# Patient Record
Sex: Male | Born: 1970 | Hispanic: No | Marital: Married | Smoking: Current every day smoker
Health system: Southern US, Community
[De-identification: ages and names within clinical notes are randomized; demographics above are authoritative.]

## PROBLEM LIST (undated history)

## (undated) DIAGNOSIS — E7800 Pure hypercholesterolemia, unspecified: Secondary | ICD-10-CM

## (undated) DIAGNOSIS — I10 Essential (primary) hypertension: Secondary | ICD-10-CM

## (undated) DIAGNOSIS — I2510 Atherosclerotic heart disease of native coronary artery without angina pectoris: Secondary | ICD-10-CM

## (undated) DIAGNOSIS — A809 Acute poliomyelitis, unspecified: Secondary | ICD-10-CM

## (undated) DIAGNOSIS — I219 Acute myocardial infarction, unspecified: Secondary | ICD-10-CM

---
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
## 2008-02-12 ENCOUNTER — Emergency Department (HOSPITAL_COMMUNITY): Admission: EM | Admit: 2008-02-12 | Discharge: 2008-02-13 | Payer: Self-pay | Admitting: Emergency Medicine

## 2008-03-07 ENCOUNTER — Emergency Department (HOSPITAL_COMMUNITY): Admission: EM | Admit: 2008-03-07 | Discharge: 2008-03-07 | Payer: Self-pay | Admitting: Emergency Medicine

## 2008-04-27 ENCOUNTER — Emergency Department (HOSPITAL_COMMUNITY): Admission: EM | Admit: 2008-04-27 | Discharge: 2008-04-27 | Payer: Self-pay | Admitting: Emergency Medicine

## 2008-05-02 ENCOUNTER — Emergency Department (HOSPITAL_COMMUNITY): Admission: EM | Admit: 2008-05-02 | Discharge: 2008-05-03 | Payer: Self-pay | Admitting: Emergency Medicine

## 2008-07-09 ENCOUNTER — Emergency Department (HOSPITAL_COMMUNITY): Admission: EM | Admit: 2008-07-09 | Discharge: 2008-07-09 | Payer: Self-pay | Admitting: Emergency Medicine

## 2009-07-24 ENCOUNTER — Emergency Department (HOSPITAL_COMMUNITY): Admission: EM | Admit: 2009-07-24 | Discharge: 2009-07-25 | Payer: Self-pay | Admitting: Emergency Medicine

## 2010-07-16 ENCOUNTER — Ambulatory Visit: Payer: Medicaid Other | Attending: *Deleted | Admitting: Physical Therapy

## 2010-08-20 LAB — BASIC METABOLIC PANEL
CO2: 26 mEq/L (ref 19–32)
Calcium: 9 mg/dL (ref 8.4–10.5)
Chloride: 105 mEq/L (ref 96–112)
Creatinine, Ser: 0.56 mg/dL (ref 0.4–1.5)
Potassium: 3.6 mEq/L (ref 3.5–5.1)

## 2010-08-20 LAB — POCT CARDIAC MARKERS
CKMB, poc: 2.2 ng/mL (ref 1.0–8.0)
Troponin i, poc: <0.05 ng/mL (ref 0.00–0.09)

## 2010-08-20 LAB — DIFFERENTIAL
Basophils Relative: 1 % (ref 0–1)
Eosinophils Absolute: 0.1 10*3/uL (ref 0.0–0.7)
Lymphs Abs: 3.7 10*3/uL (ref 0.7–4.0)
Neutro Abs: 9.2 10*3/uL — ABNORMAL HIGH (ref 1.7–7.7)
Neutrophils Relative %: 67 % (ref 43–77)

## 2010-08-20 LAB — CBC
HCT: 43.7 % (ref 39.0–52.0)
Hemoglobin: 15.3 g/dL (ref 13.0–17.0)
Platelets: 227 10*3/uL (ref 150–400)
RDW: 12.4 % (ref 11.5–15.5)

## 2011-03-04 LAB — POCT I-STAT, CHEM 8
Creatinine, Ser: 0.8
HCT: 55 — ABNORMAL HIGH
Sodium: 139

## 2011-03-04 LAB — POCT CARDIAC MARKERS
CKMB, poc: 2
Troponin i, poc: <0.05

## 2011-03-06 LAB — POCT CARDIAC MARKERS
CKMB, poc: 1.6 ng/mL (ref 1.0–8.0)
CKMB, poc: 1.8 ng/mL (ref 1.0–8.0)
Myoglobin, poc: 89.7 ng/mL (ref 12–200)

## 2011-03-06 LAB — CBC
HCT: 52.2 % — ABNORMAL HIGH (ref 39.0–52.0)
Hemoglobin: 17.7 g/dL — ABNORMAL HIGH (ref 13.0–17.0)
RBC: 5.67 MIL/uL (ref 4.22–5.81)
WBC: 11.3 10*3/uL — ABNORMAL HIGH (ref 4.0–10.5)

## 2011-03-06 LAB — DIFFERENTIAL
Basophils Absolute: 0.1 10*3/uL (ref 0.0–0.1)
Basophils Relative: 1 % (ref 0–1)
Eosinophils Absolute: 0.1 10*3/uL (ref 0.0–0.7)
Eosinophils Relative: 1 % (ref 0–5)
Lymphocytes Relative: 22 % (ref 12–46)
Lymphs Abs: 2.5 10*3/uL (ref 0.7–4.0)
Monocytes Absolute: 0.9 10*3/uL (ref 0.1–1.0)

## 2011-03-06 LAB — BASIC METABOLIC PANEL
GFR calc Af Amer: >60 mL/min (ref >60)
GFR calc non Af Amer: >60 mL/min (ref >60)
Sodium: 138 mEq/L (ref 135–145)

## 2011-04-06 ENCOUNTER — Emergency Department (HOSPITAL_COMMUNITY)
Admission: EM | Admit: 2011-04-06 | Discharge: 2011-04-06 | Discharge status: Against Medical Advice | Payer: Medicaid Other | Attending: Emergency Medicine | Admitting: Emergency Medicine

## 2011-04-06 ENCOUNTER — Encounter (HOSPITAL_COMMUNITY): Payer: Self-pay | Admitting: Emergency Medicine

## 2011-04-06 ENCOUNTER — Other Ambulatory Visit: Payer: Self-pay

## 2011-04-06 DIAGNOSIS — R079 Chest pain, unspecified: Secondary | ICD-10-CM | POA: Insufficient documentation

## 2011-04-06 DIAGNOSIS — A809 Acute poliomyelitis, unspecified: Secondary | ICD-10-CM | POA: Insufficient documentation

## 2011-04-06 HISTORY — DX: Essential (primary) hypertension: I10

## 2011-04-06 HISTORY — DX: Acute poliomyelitis, unspecified: A80.9
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
## 2011-04-06 NOTE — ED Notes (Signed)
Pt called x3 times, unable to locate.

## 2011-04-06 NOTE — ED Notes (Signed)
Pt acuity is 3

## 2012-06-25 ENCOUNTER — Emergency Department (INDEPENDENT_AMBULATORY_CARE_PROVIDER_SITE_OTHER)
Admission: EM | Admit: 2012-06-25 | Discharge: 2012-06-25 | Disposition: A | Discharge status: Home / Self Care | Payer: Medicaid Other | Source: Home / Self Care | Attending: Family Medicine | Admitting: Family Medicine

## 2012-06-25 ENCOUNTER — Encounter (HOSPITAL_COMMUNITY): Payer: Self-pay | Admitting: Emergency Medicine

## 2012-06-25 DIAGNOSIS — M545 Low back pain: Secondary | ICD-10-CM

## 2012-06-25 DIAGNOSIS — M542 Cervicalgia: Secondary | ICD-10-CM

## 2012-06-25 DIAGNOSIS — Z041 Encounter for examination and observation following transport accident: Secondary | ICD-10-CM

## 2012-06-25 MED ORDER — DICLOFENAC POTASSIUM 50 MG PO TABS: 50.0000 mg | ORAL_TABLET | Freq: Three times a day (TID) | ORAL | Status: DC

## 2012-06-25 MED ORDER — CYCLOBENZAPRINE HCL 5 MG PO TABS: 5.0000 mg | ORAL_TABLET | Freq: Three times a day (TID) | ORAL | Status: DC | PRN
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
## 2012-07-10 ENCOUNTER — Encounter (HOSPITAL_COMMUNITY): Payer: Self-pay | Admitting: Emergency Medicine

## 2012-07-10 ENCOUNTER — Emergency Department (HOSPITAL_COMMUNITY)
Admission: EM | Admit: 2012-07-10 | Discharge: 2012-07-11 | Disposition: A | Discharge status: Home / Self Care | Payer: No Typology Code available for payment source | Attending: Emergency Medicine | Admitting: Emergency Medicine

## 2012-07-10 DIAGNOSIS — G8911 Acute pain due to trauma: Secondary | ICD-10-CM | POA: Insufficient documentation

## 2012-07-10 DIAGNOSIS — I10 Essential (primary) hypertension: Secondary | ICD-10-CM | POA: Insufficient documentation

## 2012-07-10 DIAGNOSIS — G44209 Tension-type headache, unspecified, not intractable: Secondary | ICD-10-CM

## 2012-07-10 DIAGNOSIS — S161XXA Strain of muscle, fascia and tendon at neck level, initial encounter: Secondary | ICD-10-CM

## 2012-07-10 DIAGNOSIS — M542 Cervicalgia: Secondary | ICD-10-CM | POA: Insufficient documentation

## 2012-07-10 DIAGNOSIS — Z8619 Personal history of other infectious and parasitic diseases: Secondary | ICD-10-CM | POA: Insufficient documentation

## 2012-07-10 DIAGNOSIS — Z79899 Other long term (current) drug therapy: Secondary | ICD-10-CM | POA: Insufficient documentation

## 2012-07-10 DIAGNOSIS — F17200 Nicotine dependence, unspecified, uncomplicated: Secondary | ICD-10-CM | POA: Insufficient documentation
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
## 2012-07-11 ENCOUNTER — Emergency Department (HOSPITAL_COMMUNITY): Payer: No Typology Code available for payment source

## 2012-07-11 MED ORDER — IBUPROFEN 800 MG PO TABS: 800.0000 mg | ORAL_TABLET | Freq: Three times a day (TID) | ORAL | Status: DC | PRN

## 2012-07-11 MED ORDER — IBUPROFEN 800 MG PO TABS
800.0000 mg | ORAL_TABLET | Freq: Once | ORAL | Status: AC
  Administered 2012-07-11: 800 mg via ORAL
  Filled 2012-07-11: qty 1

## 2012-07-11 NOTE — ED Notes (Signed)
Pt taken to radiology

## 2012-07-11 NOTE — ED Notes (Signed)
Pt. Returned from Radiology.
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
## 2012-09-07 ENCOUNTER — Emergency Department (INDEPENDENT_AMBULATORY_CARE_PROVIDER_SITE_OTHER)
Admission: EM | Admit: 2012-09-07 | Discharge: 2012-09-07 | Disposition: A | Discharge status: Home / Self Care | Payer: Medicaid Other | Source: Home / Self Care | Attending: Family Medicine | Admitting: Family Medicine

## 2012-09-07 ENCOUNTER — Encounter (HOSPITAL_COMMUNITY): Payer: Self-pay | Admitting: *Deleted

## 2012-09-07 DIAGNOSIS — M542 Cervicalgia: Secondary | ICD-10-CM
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
## 2013-02-10 ENCOUNTER — Emergency Department (HOSPITAL_COMMUNITY)
Admission: EM | Admit: 2013-02-10 | Discharge: 2013-02-11 | Disposition: A | Discharge status: Home / Self Care | Payer: Medicaid Other | Attending: Emergency Medicine | Admitting: Emergency Medicine

## 2013-02-10 ENCOUNTER — Encounter (HOSPITAL_COMMUNITY): Payer: Self-pay

## 2013-02-10 ENCOUNTER — Emergency Department (HOSPITAL_COMMUNITY): Payer: Medicaid Other

## 2013-02-10 DIAGNOSIS — F17200 Nicotine dependence, unspecified, uncomplicated: Secondary | ICD-10-CM | POA: Insufficient documentation

## 2013-02-10 DIAGNOSIS — J40 Bronchitis, not specified as acute or chronic: Secondary | ICD-10-CM | POA: Insufficient documentation

## 2013-02-10 DIAGNOSIS — R0789 Other chest pain: Secondary | ICD-10-CM | POA: Insufficient documentation

## 2013-02-10 DIAGNOSIS — Z79899 Other long term (current) drug therapy: Secondary | ICD-10-CM | POA: Insufficient documentation

## 2013-02-10 DIAGNOSIS — R51 Headache: Secondary | ICD-10-CM | POA: Insufficient documentation

## 2013-02-10 DIAGNOSIS — I10 Essential (primary) hypertension: Secondary | ICD-10-CM | POA: Insufficient documentation

## 2013-02-10 DIAGNOSIS — G44209 Tension-type headache, unspecified, not intractable: Secondary | ICD-10-CM

## 2013-02-10 DIAGNOSIS — M542 Cervicalgia: Secondary | ICD-10-CM | POA: Insufficient documentation

## 2013-02-10 DIAGNOSIS — Z8612 Personal history of poliomyelitis: Secondary | ICD-10-CM | POA: Insufficient documentation

## 2013-02-10 DIAGNOSIS — R112 Nausea with vomiting, unspecified: Secondary | ICD-10-CM | POA: Insufficient documentation

## 2013-02-10 MED ORDER — LORAZEPAM 1 MG PO TABS
1.0000 mg | ORAL_TABLET | Freq: Once | ORAL | Status: AC
  Administered 2013-02-11: 1 mg via ORAL
  Filled 2013-02-10: qty 1

## 2013-02-10 MED ORDER — KETOROLAC TROMETHAMINE 60 MG/2ML IM SOLN
60.0000 mg | Freq: Once | INTRAMUSCULAR | Status: AC
Start: 2013-02-11 — End: 2013-02-11
  Administered 2013-02-11: 60 mg via INTRAMUSCULAR
  Filled 2013-02-10: qty 2

## 2013-02-10 MED ORDER — METOCLOPRAMIDE HCL 10 MG PO TABS
10.0000 mg | ORAL_TABLET | Freq: Once | ORAL | Status: AC
  Administered 2013-02-11: 10 mg via ORAL
  Filled 2013-02-10: qty 1

## 2013-02-10 NOTE — ED Notes (Signed)
Pt states headache at triage.  Pt states now chest pain and nausea/vomiting since last night.  Pt also states increased in shortness of breath.

## 2013-02-11 LAB — CBC WITH DIFFERENTIAL/PLATELET
Basophils Relative: 0 % (ref 0–1)
Eosinophils Absolute: 0.1 10*3/uL (ref 0.0–0.7)
Eosinophils Relative: 0 % (ref 0–5)
HCT: 39.6 % (ref 39.0–52.0)
Hemoglobin: 13.7 g/dL (ref 13.0–17.0)
MCH: 30.2 pg (ref 26.0–34.0)
MCHC: 34.6 g/dL (ref 30.0–36.0)
MCV: 87.4 fL (ref 78.0–100.0)
Monocytes Absolute: 0.5 10*3/uL (ref 0.1–1.0)
Monocytes Relative: 4 % (ref 3–12)
Neutrophils Relative %: 77 % (ref 43–77)

## 2013-02-11 LAB — COMPREHENSIVE METABOLIC PANEL
Albumin: 3.3 g/dL — ABNORMAL LOW (ref 3.5–5.2)
BUN: 6 mg/dL (ref 6–23)
Calcium: 9.6 mg/dL (ref 8.4–10.5)
Creatinine, Ser: 0.52 mg/dL (ref 0.50–1.35)
GFR calc Af Amer: >90 mL/min (ref >90)
Glucose, Bld: 161 mg/dL — ABNORMAL HIGH (ref 70–99)
Total Protein: 6.8 g/dL (ref 6.0–8.3)

## 2013-02-11 LAB — TROPONIN I: Troponin I: <0.3 ng/mL (ref <0.30)

## 2013-02-11 MED ORDER — LORAZEPAM 1 MG PO TABS: 1.0000 mg | ORAL_TABLET | Freq: Four times a day (QID) | ORAL | Status: DC | PRN

## 2013-02-11 MED ORDER — IBUPROFEN 600 MG PO TABS: 600.0000 mg | ORAL_TABLET | Freq: Four times a day (QID) | ORAL | Status: DC | PRN

## 2013-02-11 MED ORDER — BENZONATATE 100 MG PO CAPS: 100.0000 mg | ORAL_CAPSULE | Freq: Three times a day (TID) | ORAL | Status: DC
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
## 2013-02-11 NOTE — ED Notes (Signed)
EKG given to EDP, Yelver[place], MD. 

## 2013-03-21 ENCOUNTER — Emergency Department (HOSPITAL_COMMUNITY): Payer: Medicaid Other

## 2013-03-21 ENCOUNTER — Encounter (HOSPITAL_COMMUNITY): Payer: Self-pay | Admitting: Emergency Medicine

## 2013-03-21 ENCOUNTER — Emergency Department (HOSPITAL_COMMUNITY)
Admission: EM | Admit: 2013-03-21 | Discharge: 2013-03-22 | Discharge status: Against Medical Advice | Payer: Medicaid Other | Attending: Emergency Medicine | Admitting: Emergency Medicine

## 2013-03-21 DIAGNOSIS — Z8612 Personal history of poliomyelitis: Secondary | ICD-10-CM | POA: Insufficient documentation

## 2013-03-21 DIAGNOSIS — R079 Chest pain, unspecified: Secondary | ICD-10-CM

## 2013-03-21 DIAGNOSIS — G8929 Other chronic pain: Secondary | ICD-10-CM | POA: Insufficient documentation

## 2013-03-21 DIAGNOSIS — Z79899 Other long term (current) drug therapy: Secondary | ICD-10-CM | POA: Insufficient documentation

## 2013-03-21 DIAGNOSIS — I10 Essential (primary) hypertension: Secondary | ICD-10-CM | POA: Insufficient documentation

## 2013-03-21 DIAGNOSIS — R0789 Other chest pain: Secondary | ICD-10-CM | POA: Insufficient documentation

## 2013-03-21 DIAGNOSIS — F17200 Nicotine dependence, unspecified, uncomplicated: Secondary | ICD-10-CM | POA: Insufficient documentation

## 2013-03-21 DIAGNOSIS — M549 Dorsalgia, unspecified: Secondary | ICD-10-CM | POA: Insufficient documentation

## 2013-03-21 DIAGNOSIS — R209 Unspecified disturbances of skin sensation: Secondary | ICD-10-CM | POA: Insufficient documentation

## 2013-03-21 LAB — CBC WITH DIFFERENTIAL/PLATELET
Basophils Relative: 0 % (ref 0–1)
Hemoglobin: 14.5 g/dL (ref 13.0–17.0)
Lymphs Abs: 4.2 10*3/uL — ABNORMAL HIGH (ref 0.7–4.0)
Monocytes Relative: 7 % (ref 3–12)
Neutro Abs: 7.7 10*3/uL (ref 1.7–7.7)
Neutrophils Relative %: 60 % (ref 43–77)
Platelets: 254 10*3/uL (ref 150–400)
RBC: 4.84 MIL/uL (ref 4.22–5.81)

## 2013-03-21 LAB — COMPREHENSIVE METABOLIC PANEL
ALT: 17 U/L (ref 0–53)
Albumin: 3.5 g/dL (ref 3.5–5.2)
Alkaline Phosphatase: 82 U/L (ref 39–117)
BUN: 9 mg/dL (ref 6–23)
Chloride: 101 mEq/L (ref 96–112)
Potassium: 3.3 mEq/L — ABNORMAL LOW (ref 3.5–5.1)
Sodium: 136 mEq/L (ref 135–145)
Total Bilirubin: 0.2 mg/dL — ABNORMAL LOW (ref 0.3–1.2)
Total Protein: 7 g/dL (ref 6.0–8.3)

## 2013-03-21 LAB — TROPONIN I: Troponin I: <0.3 ng/mL

## 2013-03-21 MED ORDER — ASPIRIN 81 MG PO CHEW
324.0000 mg | CHEWABLE_TABLET | Freq: Once | ORAL | Status: AC
  Administered 2013-03-22: 324 mg via ORAL
  Filled 2013-03-21: qty 4

## 2013-03-21 NOTE — ED Notes (Signed)
Patient transported to X-ray 
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
## 2013-03-21 NOTE — ED Notes (Signed)
Pt c/o l chest pain x a few hours. Pt reports pain radiating to left arm and back.  Denies SOB. Hx of HTN and tingling in hands,.
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
## 2013-05-30 ENCOUNTER — Emergency Department (HOSPITAL_COMMUNITY)
Admission: EM | Admit: 2013-05-30 | Discharge: 2013-05-31 | Disposition: A | Discharge status: Home / Self Care | Payer: Medicaid Other | Attending: Emergency Medicine | Admitting: Emergency Medicine

## 2013-05-30 ENCOUNTER — Encounter (HOSPITAL_COMMUNITY): Payer: Self-pay | Admitting: Emergency Medicine

## 2013-05-30 DIAGNOSIS — F17200 Nicotine dependence, unspecified, uncomplicated: Secondary | ICD-10-CM | POA: Insufficient documentation

## 2013-05-30 DIAGNOSIS — Z79899 Other long term (current) drug therapy: Secondary | ICD-10-CM | POA: Insufficient documentation

## 2013-05-30 DIAGNOSIS — I10 Essential (primary) hypertension: Secondary | ICD-10-CM | POA: Insufficient documentation

## 2013-05-30 DIAGNOSIS — B86 Scabies: Secondary | ICD-10-CM | POA: Insufficient documentation

## 2013-05-30 NOTE — ED Notes (Signed)
Pt complains of a rash on his chest and legs for three weeks

## 2013-05-31 MED ORDER — PERMETHRIN 5 % EX CREA: TOPICAL_CREAM | CUTANEOUS | Status: DC

## 2013-05-31 NOTE — ED Provider Notes (Signed)
CSN: [id number]     Arrival date & time 05/30/13  2319 History   First MD Initiated Contact with Patient 05/31/13 0048     Chief Complaint  Patient presents with  . Rash   HPI  History provided by the patient. Patient is a 42 year old male with history of hypertension and polio who presents with persistent pruritic rash to body. Patient states that the entire family including his wife and 2 children have had similar pruritic rash to the skin for the past 3 weeks. He complains mostly of itching and rash to his hands and arms as well as his lower abdomen and groin. He reports symptoms have been worsening without any improvement. Has not been using any treatment for symptoms. Denies having similar symptoms previously. No changes in home living. No new linens, beds or clothing. No new soaps or body lotions. No other aggravating or alleviating factors. No other associated symptoms.   Past Medical History  Diagnosis Date  . Polio   . Hypertension    Past Surgical History  Procedure Laterality Date  . Hip surgery Left 1987    for polio, not sure what was done  . Knee surgery Left 1987    for polio   Family History  Problem Relation Age of Onset  . Hypertension Mother   . Heart failure Father    History  Substance Use Topics  . Smoking status: Current Every Day Smoker -- 1.50 packs/day    Types: Cigarettes  . Smokeless tobacco: Not on file  . Alcohol Use: No    Review of Systems  Constitutional: Negative for fever, chills and diaphoresis.  All other systems reviewed and are negative.    Allergies  Review of patient's allergies indicates no known allergies.  Home Medications   Current Outpatient Rx  Name  Route  Sig  Dispense  Refill  . hydrochlorothiazide (HYDRODIURIL) 25 MG tablet   Oral   Take 25 mg by mouth every evening.          [name] lisinopril (PRINIVIL,ZESTRIL) 40 MG tablet   Oral   Take 40 mg by mouth daily.           . tadalafil (CIALIS) 10 MG tablet  Oral   Take 10 mg by mouth daily as needed for erectile dysfunction.          BP 125/79  Pulse 102  Temp(Src) 98.8 F (37.1 C) (Oral)  Resp 16  SpO2 97% Physical Exam  Nursing note and vitals reviewed. Constitutional: He appears well-developed and well-nourished. No distress.  HENT:  Head: Normocephalic.  Mouth/Throat: Oropharynx is clear and moist.  Cardiovascular: Normal rate and regular rhythm.   Pulmonary/Chest: Effort normal and breath sounds normal. No respiratory distress. He has no wheezes. He has no rales.  Abdominal: Soft.  Neurological: He is alert.  Skin: Skin is warm. Rash noted.  Multiple small maculopapular lesions with secondary excoriations bilateral upper extremities, upper chest and lower abdomen.  Psychiatric: He has a normal mood and affect.    ED Course  Procedures    COORDINATION OF CARE:  Nursing notes reviewed. Vital signs reviewed. Initial pt interview and examination performed.   1:00 AM-rash consistent with other family members. Suspicious and concerning for scabies. Diagnosis and treatment discussed with family.    MDM   1. Scabies        Angus Seller, PA-C 05/31/13 [phone]

## 2013-06-01 NOTE — ED Provider Notes (Signed)
Medical screening examination/treatment/procedure(s) were performed by non-physician practitioner and as supervising physician I was immediately available for consultation/collaboration.    Tammatha Cobb, MD 06/01/13 0951 

## 2013-11-15 ENCOUNTER — Emergency Department (HOSPITAL_COMMUNITY): Payer: Medicaid Other

## 2013-11-15 ENCOUNTER — Emergency Department (HOSPITAL_COMMUNITY)
Admission: EM | Admit: 2013-11-15 | Discharge: 2013-11-15 | Disposition: A | Discharge status: Home / Self Care | Payer: Medicaid Other | Attending: Emergency Medicine | Admitting: Emergency Medicine

## 2013-11-15 ENCOUNTER — Encounter (HOSPITAL_COMMUNITY): Payer: Self-pay | Admitting: Emergency Medicine

## 2013-11-15 DIAGNOSIS — M25531 Pain in right wrist: Secondary | ICD-10-CM

## 2013-11-15 DIAGNOSIS — M25539 Pain in unspecified wrist: Secondary | ICD-10-CM | POA: Insufficient documentation

## 2013-11-15 DIAGNOSIS — R5383 Other fatigue: Secondary | ICD-10-CM

## 2013-11-15 DIAGNOSIS — I10 Essential (primary) hypertension: Secondary | ICD-10-CM | POA: Insufficient documentation

## 2013-11-15 DIAGNOSIS — Z8619 Personal history of other infectious and parasitic diseases: Secondary | ICD-10-CM | POA: Insufficient documentation

## 2013-11-15 DIAGNOSIS — M542 Cervicalgia: Secondary | ICD-10-CM | POA: Insufficient documentation

## 2013-11-15 DIAGNOSIS — M25532 Pain in left wrist: Secondary | ICD-10-CM

## 2013-11-15 DIAGNOSIS — F17200 Nicotine dependence, unspecified, uncomplicated: Secondary | ICD-10-CM | POA: Insufficient documentation

## 2013-11-15 DIAGNOSIS — R5381 Other malaise: Secondary | ICD-10-CM | POA: Insufficient documentation

## 2013-11-15 DIAGNOSIS — R0789 Other chest pain: Secondary | ICD-10-CM | POA: Insufficient documentation

## 2013-11-15 DIAGNOSIS — Z79899 Other long term (current) drug therapy: Secondary | ICD-10-CM | POA: Insufficient documentation

## 2013-11-15 DIAGNOSIS — IMO0001 Reserved for inherently not codable concepts without codable children: Secondary | ICD-10-CM | POA: Insufficient documentation

## 2013-11-15 DIAGNOSIS — M546 Pain in thoracic spine: Secondary | ICD-10-CM | POA: Insufficient documentation

## 2013-11-15 LAB — CBC
HCT: 45 % (ref 39.0–52.0)
HEMOGLOBIN: 15.4 g/dL (ref 13.0–17.0)
MCH: 30 pg (ref 26.0–34.0)
MCHC: 34.2 g/dL (ref 30.0–36.0)
MCV: 87.7 fL (ref 78.0–100.0)
Platelets: 238 10*3/uL (ref 150–400)
RBC: 5.13 MIL/uL (ref 4.22–5.81)
RDW: 13 % (ref 11.5–15.5)
WBC: 16.8 10*3/uL — ABNORMAL HIGH (ref 4.0–10.5)

## 2013-11-15 LAB — BASIC METABOLIC PANEL
BUN: 7 mg/dL (ref 6–23)
CO2: 23 meq/L (ref 19–32)
Calcium: 9.7 mg/dL (ref 8.4–10.5)
Chloride: 99 mEq/L (ref 96–112)
Creatinine, Ser: 0.59 mg/dL (ref 0.50–1.35)
GFR calc Af Amer: >90 mL/min (ref >90)
GFR calc non Af Amer: >90 mL/min (ref >90)
GLUCOSE: 99 mg/dL (ref 70–99)
POTASSIUM: 3.5 meq/L — AB (ref 3.7–5.3)
SODIUM: 137 meq/L (ref 137–147)

## 2013-11-15 LAB — PRO B NATRIURETIC PEPTIDE: PRO B NATRI PEPTIDE: 7.4 pg/mL (ref 0–125)

## 2013-11-15 LAB — I-STAT TROPONIN, ED: Troponin i, poc: 0 ng/mL (ref 0.00–0.08)

## 2013-11-15 LAB — D-DIMER, QUANTITATIVE (NOT AT ARMC): D DIMER QUANT: 0.27 ug{FEU}/mL (ref 0.00–0.48)

## 2013-11-15 MED ORDER — KETOROLAC TROMETHAMINE 60 MG/2ML IM SOLN
60.0000 mg | Freq: Once | INTRAMUSCULAR | Status: AC
  Administered 2013-11-15: 60 mg via INTRAMUSCULAR
  Filled 2013-11-15: qty 2

## 2013-11-15 MED ORDER — IBUPROFEN 600 MG PO TABS: 600.0000 mg | ORAL_TABLET | Freq: Four times a day (QID) | ORAL | Status: DC | PRN

## 2013-11-15 NOTE — ED Provider Notes (Signed)
CSN: [id number]     Arrival date & time 11/15/13  0411 History   First MD Initiated Contact with Patient 11/15/13 [phone]     Chief Complaint  Patient presents with  . Chest Pain    radiating to both arms, and back x1 month     (Consider location/radiation/quality/duration/timing/severity/associated sxs/prior Treatment) HPI Patient has chronic back shoulder and chest pain. His chest pain has been worsening for the past one month. Patient describes the pain as sharp and left-sided. It is worse with certain movements. Patient has a history of polio and walks with the aid of crutches. He complains of ongoing bilateral wrist pain that radiates up the forearm and down into the hands. This pain is worse at nights. She has no associated numbness but states he does have some weakness in his hands. He denies any shortness. He currently has no chest pain. He has no fevers or chills. Past Medical History  Diagnosis Date  . Polio   . Hypertension    Past Surgical History  Procedure Laterality Date  . Hip surgery Left 1987    for polio, not sure what was done  . Knee surgery Left 1987    for polio   Family History  Problem Relation Age of Onset  . Hypertension Mother   . Heart failure Father    History  Substance Use Topics  . Smoking status: Current Every Day Smoker -- 2.00 packs/day    Types: Cigarettes  . Smokeless tobacco: Not on file  . Alcohol Use: Yes     Comment: occ    Review of Systems  Constitutional: Negative for fever and chills.  Respiratory: Negative for cough and shortness of breath.   Cardiovascular: Positive for chest pain. Negative for palpitations and leg swelling.  Gastrointestinal: Negative for nausea, vomiting, abdominal pain and diarrhea.  Musculoskeletal: Positive for back pain, myalgias and neck pain. Negative for neck stiffness.  Skin: Negative for wound.  Neurological: Positive for weakness. Negative for dizziness, syncope, light-headedness, numbness and  headaches.  All other systems reviewed and are negative.     Allergies  Review of patient's allergies indicates no known allergies.  Home Medications   Prior to Admission medications   Medication Sig Start Date End Date Taking? Authorizing Chelli Yerkes  hydrochlorothiazide (HYDRODIURIL) 25 MG tablet Take 25 mg by mouth every evening.    Yes Historical Xee Hollman, MD  lisinopril (PRINIVIL,ZESTRIL) 40 MG tablet Take 40 mg by mouth daily.     Yes Historical Ezelle Surprenant, MD  tadalafil (CIALIS) 10 MG tablet Take 10 mg by mouth daily as needed for erectile dysfunction.   Yes Historical Ruthia Person, MD   BP 133/89  Pulse 89  Temp(Src) 98.2 F (36.8 C) (Oral)  Resp 16  Ht 5\' 5"  (1.651 m)  Wt 161 lb (73.029 kg)  BMI 26.79 kg/m2  SpO2 100% Physical Exam  Nursing note and vitals reviewed. Constitutional: He is oriented to person, place, and time. He appears well-developed and well-nourished. No distress.  HENT:  Head: Normocephalic and atraumatic.  Mouth/Throat: Oropharynx is clear and moist.  Eyes: EOM are normal. Pupils are equal, round, and reactive to light.  Neck: Normal range of motion. Neck supple.  Cardiovascular: Normal rate and regular rhythm.   Pulmonary/Chest: Effort normal and breath sounds normal. No respiratory distress. He has no wheezes. He has no rales. He exhibits tenderness (Mild left inferior chest tenderness with palpation).  Abdominal: Soft. Bowel sounds are normal. He exhibits no distension and no mass. There  is no tenderness. There is no rebound and no guarding.  Musculoskeletal: Normal range of motion. He exhibits tenderness (diffuse thoracic tenderness with palpation.). He exhibits no edema.  Negative Phalen and Tinel signs. Good distal pulses  Neurological: He is alert and oriented to person, place, and time.  Atrophic lower extremities. Bilateral upper extremities with equal grip strength. Sensation is intact.  Skin: Skin is warm and dry. No rash noted. No erythema.   Psychiatric: He has a normal mood and affect. His behavior is normal.    ED Course  Procedures (including critical care time) Labs Review Labs Reviewed  CBC - Abnormal; Notable for the following:    WBC 16.8 (*)    All other components within normal limits  BASIC METABOLIC PANEL - Abnormal; Notable for the following:    Potassium 3.5 (*)    All other components within normal limits  PRO B NATRIURETIC PEPTIDE  D-DIMER, QUANTITATIVE  I-STAT TROPOININ, ED    Imaging Review No results found.   EKG Interpretation   Date/Time:  Wednesday November 15 2013 04:16:12 EDT Ventricular Rate:  90 PR Interval:  160 QRS Duration: 84 QT Interval:  349 QTC Calculation: 427 R Axis:   77 Text Interpretation:  Sinus rhythm Probable left atrial enlargement  Anteroseptal infarct, old Minimal ST elevation, inferior leads Baseline  wander in lead(s) V3 Confirmed by Ranae PalmsYELVERTON  MD, DAVID ([id number]) on  11/15/2013 6:58:59 AM      MDM   Final diagnoses:  None    Chest pain is atypical. EKG without acute findings. Suspect is symptoms are likely musculoskeletal in nature. Also given the use of crutches and hyperextension of the wrist to use the crutches, carpal tunnel syndrome is a possibility. We'll screen with labs and placed in bilateral Velcro wrist spirit the patient will need to followup with hand surgery.  Normal EKG and troponin. Normal d-dimer. Mild elevation of white blood cell count of uncertain significance. We'll treat with NSAIDs. Hand surgery followup given. Return precautions given.  Loren Raceravid Yelverton, MD 11/15/13 [phone]

## 2013-11-15 NOTE — ED Notes (Signed)
Patient c/o chest pain ongoing x1 month. Patient states pain radiates into both arms and his back and left shoulder. Patient states pain is more intense when he first awakens, and that today the pain is slightly worse than normal.

## 2013-11-15 NOTE — Discharge Instructions (Signed)
Carpal Tunnel Syndrome The carpal tunnel is a narrow area located on the palm side of your wrist. The tunnel is formed by the wrist bones and ligaments. Nerves, blood vessels, and tendons pass through the carpal tunnel. Repeated wrist motion or certain diseases may cause swelling within the tunnel. This swelling pinches the main nerve in the wrist (median nerve) and causes the painful hand and arm condition called carpal tunnel syndrome. CAUSES   Repeated wrist motions.  Wrist injuries.  Certain diseases like arthritis, diabetes, alcoholism, hyperthyroidism, and kidney failure.  Obesity.  Pregnancy. SYMPTOMS   A "pins and needles" feeling in your fingers or hand.  Tingling or numbness in your fingers or hand.  An aching feeling in your entire arm.  Wrist pain that goes up your arm to your shoulder.  Pain that goes down into your palm or fingers.  A weak feeling in your hands. DIAGNOSIS  Your caregiver will take your history and perform a physical exam. An electromyography test may be needed. This test measures electrical signals sent out by the muscles. The electrical signals are usually slowed by carpal tunnel syndrome. You may also need X-rays. TREATMENT  Carpal tunnel syndrome may clear up by itself. Your caregiver may recommend a wrist splint or medicine such as a nonsteroidal anti-inflammatory medicine. Cortisone injections may help. Sometimes, surgery may be needed to free the pinched nerve.  HOME CARE INSTRUCTIONS   Take all medicine as directed by your caregiver. Only take over-the-counter or prescription medicines for pain, discomfort, or fever as directed by your caregiver.  If you were given a splint to keep your wrist from bending, wear it as directed. It is important to wear the splint at night. Wear the splint for as long as you have pain or numbness in your hand, arm, or wrist. This may take 1 to 2 months.  Rest your wrist from any activity that may be causing your  pain. If your symptoms are work-related, you may need to talk to your employer about changing to a job that does not require using your wrist.  Put ice on your wrist after long periods of wrist activity.  Put ice in a plastic bag.  Place a towel between your skin and the bag.  Leave the ice on for 15-20 minutes, 03-04 times a day.  Keep all follow-up visits as directed by your caregiver. This includes any orthopedic referrals, physical therapy, and rehabilitation. Any delay in getting necessary care could result in a delay or failure of your condition to heal. SEEK IMMEDIATE MEDICAL CARE IF:   You have new, unexplained symptoms.  Your symptoms get worse and are not helped or controlled with medicines. MAKE SURE YOU:   Understand these instructions.  Will watch your condition.  Will get help right away if you are not doing well or get worse. Document Released: 05/15/2000 Document Revised: 08/10/2011 Document Reviewed: 04/03/2011 [facility] Patient Information 2015 [place], [place]. This information is not intended to replace advice given to you by your health care provider. Make sure you discuss any questions you have with your health care provider.  Chest Pain (Nonspecific) It is often hard to give a specific diagnosis for the cause of chest pain. There is always a chance that your pain could be related to something serious, such as a heart attack or a blood clot in the lungs. You need to follow up with your health care provider for further evaluation. CAUSES   Heartburn.  Pneumonia or bronchitis.  Anxiety or stress.  Inflammation around your heart (pericarditis) or lung (pleuritis or pleurisy).  A blood clot in the lung.  A collapsed lung (pneumothorax). It can develop suddenly on its own (spontaneous pneumothorax) or from trauma to the chest.  Shingles infection (herpes zoster virus). The chest wall is composed of bones, muscles, and cartilage. Any of these can be the source  of the pain.  The bones can be bruised by injury.  The muscles or cartilage can be strained by coughing or overwork.  The cartilage can be affected by inflammation and become sore (costochondritis). DIAGNOSIS  Lab tests or other studies may be needed to find the cause of your pain. Your health care provider may have you take a test called an ambulatory electrocardiogram (ECG). An ECG records your heartbeat patterns over a 24-hour period. You may also have other tests, such as:  Transthoracic echocardiogram (TTE). During echocardiography, sound waves are used to evaluate how blood flows through your heart.  Transesophageal echocardiogram (TEE).  Cardiac monitoring. This allows your health care provider to monitor your heart rate and rhythm in real time.  Holter monitor. This is a portable device that records your heartbeat and can help diagnose heart arrhythmias. It allows your health care provider to track your heart activity for several days, if needed.  Stress tests by exercise or by giving medicine that makes the heart beat faster. TREATMENT   Treatment depends on what may be causing your chest pain. Treatment may include:  Acid blockers for heartburn.  Anti-inflammatory medicine.  Pain medicine for inflammatory conditions.  Antibiotics if an infection is present.  You may be advised to change lifestyle habits. This includes stopping smoking and avoiding alcohol, caffeine, and chocolate.  You may be advised to keep your head raised (elevated) when sleeping. This reduces the chance of acid going backward from your stomach into your esophagus. Most of the time, nonspecific chest pain will improve within 2-3 days with rest and mild pain medicine.  HOME CARE INSTRUCTIONS   If antibiotics were prescribed, take them as directed. Finish them even if you start to feel better.  For the next few days, avoid physical activities that bring on chest pain. Continue physical activities as  directed.  Do not use any tobacco products, including cigarettes, chewing tobacco, or electronic cigarettes.  Avoid drinking alcohol.  Only take medicine as directed by your health care provider.  Follow your health care provider's suggestions for further testing if your chest pain does not go away.  Keep any follow-up appointments you made. If you do not go to an appointment, you could develop lasting (chronic) problems with pain. If there is any problem keeping an appointment, call to reschedule. SEEK MEDICAL CARE IF:   Your chest pain does not go away, even after treatment.  You have a rash with blisters on your chest.  You have a fever. SEEK IMMEDIATE MEDICAL CARE IF:   You have increased chest pain or pain that spreads to your arm, neck, jaw, back, or abdomen.  You have shortness of breath.  You have an increasing cough, or you cough up blood.  You have severe back or abdominal pain.  You feel nauseous or vomit.  You have severe weakness.  You faint.  You have chills. This is an emergency. Do not wait to see if the pain will go away. Get medical help at once. Call your local emergency services (911 in [place] Do not drive yourself to the hospital. MAKE SURE  YOU:   Understand these instructions.  Will watch your condition.  Will get help right away if you are not doing well or get worse. Document Released: 02/25/2005 Document Revised: 05/23/2013 Document Reviewed: 12/22/2007 [facility] Patient Information 2015 [place], [place]. This information is not intended to replace advice given to you by your health care provider. Make sure you discuss any questions you have with your health care provider.

## 2013-11-15 NOTE — ED Notes (Signed)
Patient also endorses SOB

## 2014-05-10 ENCOUNTER — Encounter (HOSPITAL_COMMUNITY): Payer: Self-pay | Admitting: Emergency Medicine

## 2014-05-10 ENCOUNTER — Emergency Department (HOSPITAL_COMMUNITY)
Admission: EM | Admit: 2014-05-10 | Discharge: 2014-05-10 | Disposition: A | Discharge status: Home / Self Care | Payer: Medicaid Other | Attending: Emergency Medicine | Admitting: Emergency Medicine

## 2014-05-10 ENCOUNTER — Emergency Department (HOSPITAL_COMMUNITY): Payer: Medicaid Other

## 2014-05-10 DIAGNOSIS — Z791 Long term (current) use of non-steroidal anti-inflammatories (NSAID): Secondary | ICD-10-CM | POA: Insufficient documentation

## 2014-05-10 DIAGNOSIS — Z8619 Personal history of other infectious and parasitic diseases: Secondary | ICD-10-CM | POA: Insufficient documentation

## 2014-05-10 DIAGNOSIS — J069 Acute upper respiratory infection, unspecified: Secondary | ICD-10-CM | POA: Diagnosis not present

## 2014-05-10 DIAGNOSIS — R0789 Other chest pain: Secondary | ICD-10-CM | POA: Insufficient documentation

## 2014-05-10 DIAGNOSIS — Z720 Tobacco use: Secondary | ICD-10-CM | POA: Insufficient documentation

## 2014-05-10 DIAGNOSIS — I10 Essential (primary) hypertension: Secondary | ICD-10-CM | POA: Insufficient documentation

## 2014-05-10 DIAGNOSIS — E780 Pure hypercholesterolemia: Secondary | ICD-10-CM | POA: Insufficient documentation

## 2014-05-10 DIAGNOSIS — Z79899 Other long term (current) drug therapy: Secondary | ICD-10-CM | POA: Diagnosis not present

## 2014-05-10 DIAGNOSIS — R079 Chest pain, unspecified: Secondary | ICD-10-CM

## 2014-05-10 HISTORY — DX: Pure hypercholesterolemia, unspecified: E78.00

## 2014-05-10 LAB — I-STAT TROPONIN, ED: TROPONIN I, POC: 0 ng/mL (ref 0.00–0.08)

## 2014-05-10 LAB — CBC
HCT: 41.5 % (ref 39.0–52.0)
HEMOGLOBIN: 14.1 g/dL (ref 13.0–17.0)
MCH: 30.1 pg (ref 26.0–34.0)
MCHC: 34 g/dL (ref 30.0–36.0)
MCV: 88.7 fL (ref 78.0–100.0)
PLATELETS: 225 10*3/uL (ref 150–400)
RBC: 4.68 MIL/uL (ref 4.22–5.81)
RDW: 13.1 % (ref 11.5–15.5)
WBC: 12.8 10*3/uL — ABNORMAL HIGH (ref 4.0–10.5)

## 2014-05-10 LAB — BASIC METABOLIC PANEL
ANION GAP: 15 (ref 5–15)
BUN: 9 mg/dL (ref 6–23)
CALCIUM: 9.3 mg/dL (ref 8.4–10.5)
CO2: 21 mEq/L (ref 19–32)
Chloride: 97 mEq/L (ref 96–112)
Creatinine, Ser: 0.63 mg/dL (ref 0.50–1.35)
GFR calc Af Amer: >90 mL/min (ref >90)
GFR calc non Af Amer: >90 mL/min (ref >90)
GLUCOSE: 146 mg/dL — AB (ref 70–99)
POTASSIUM: 4.1 meq/L (ref 3.7–5.3)
SODIUM: 133 meq/L — AB (ref 137–147)

## 2014-05-10 NOTE — ED Provider Notes (Signed)
CSN: [id number]     Arrival date & time 05/10/14  1311 History   First MD Initiated Contact with Patient 05/10/14 1352     Chief Complaint  Patient presents with  . Chest Pain     (Consider location/radiation/quality/duration/timing/severity/associated sxs/prior Treatment) Patient is a 43 y.o. male presenting with chest pain. The history is provided by the patient. No language interpreter was used.  Chest Pain Pain location:  L chest Pain quality: aching   Pain radiates to:  Does not radiate Pain radiates to the back: no   Pain severity:  Mild Duration:  4 weeks Timing:  Constant Progression:  Unchanged Chronicity:  New Context: not lifting and no movement   Relieved by:  Nothing Worsened by:  Nothing tried Ineffective treatments:  None tried Associated symptoms: cough and dizziness   Associated symptoms: no abdominal pain and no shortness of breath   Pt has had chest pain for 4 days.   Pt has a cough and congestion and feels like he has the flu.  Pt has been taking theraflu.   Pt reports his MD recently started him on blood pressure medication and mevacor.   (mevacor causes him to be sleepy.   Pt is confused about diet.  He does not know what is heart healthy.    Past Medical History  Diagnosis Date  . Polio   . Hypertension   . Hypercholesteremia    Past Surgical History  Procedure Laterality Date  . Hip surgery Left 1987    for polio, not sure what was done  . Knee surgery Left 1987    for polio   Family History  Problem Relation Age of Onset  . Hypertension Mother   . Heart failure Father    History  Substance Use Topics  . Smoking status: Current Every Day Smoker -- 2.00 packs/day    Types: Cigarettes  . Smokeless tobacco: Not on file  . Alcohol Use: Yes     Comment: occ    Review of Systems  Respiratory: Positive for cough. Negative for shortness of breath.   Cardiovascular: Positive for chest pain.  Gastrointestinal: Negative for abdominal pain.   Neurological: Positive for dizziness.  All other systems reviewed and are negative.     Allergies  Review of patient's allergies indicates no known allergies.  Home Medications   Prior to Admission medications   Medication Sig Start Date End Date Taking? Authorizing Provider  Chlorphen-Pseudoephed-APAP (THERAFLU FLU/COLD PO) Take 1 packet by mouth every 6 (six) hours as needed (cold symptoms).   Yes Historical Provider, MD  hydrochlorothiazide (HYDRODIURIL) 25 MG tablet Take 25 mg by mouth daily.    Yes Historical Provider, MD  indomethacin (INDOCIN) 25 MG capsule Take 25 mg by mouth 3 (three) times daily with meals.   Yes Historical Provider, MD  lisinopril (PRINIVIL,ZESTRIL) 40 MG tablet Take 40 mg by mouth daily.     Yes Historical Provider, MD  lovastatin (MEVACOR) 20 MG tablet Take 20 mg by mouth at bedtime.   Yes Historical Provider, MD  tadalafil (CIALIS) 10 MG tablet Take 10 mg by mouth daily as needed for erectile dysfunction.   Yes Historical Provider, MD  ibuprofen (ADVIL,MOTRIN) 600 MG tablet Take 1 tablet (600 mg total) by mouth every 6 (six) hours as needed. 11/15/13   Loren Raceravid Yelverton, MD   BP 144/78 mmHg  Pulse 111  Temp(Src) 98 F (36.7 C) (Oral)  Resp 17  SpO2 99% Physical Exam  Constitutional: He is oriented  to person, place, and time. He appears well-developed and well-nourished.  HENT:  Head: Normocephalic and atraumatic.  Right Ear: External ear normal.  Left Ear: External ear normal.  Mouth/Throat: Oropharynx is clear and moist.  Eyes: Conjunctivae and EOM are normal. Pupils are equal, round, and reactive to light.  Neck: Normal range of motion.  Cardiovascular: Normal rate and normal heart sounds.   Pulmonary/Chest: Effort normal and breath sounds normal.  Abdominal: Soft. He exhibits no distension.  Musculoskeletal: Normal range of motion.  Neurological: He is alert and oriented to person, place, and time.  Skin: Skin is warm.  Psychiatric: He has a  normal mood and affect.  Nursing note and vitals reviewed.   ED Course  Procedures (including critical care time) Labs Review Labs Reviewed  CBC - Abnormal; Notable for the following:    WBC 12.8 (*)    All other components within normal limits  BASIC METABOLIC PANEL - Abnormal; Notable for the following:    Sodium 133 (*)    Glucose, Bld 146 (*)    All other components within normal limits  I-STAT TROPOININ, ED    Imaging Review Dg Chest 2 View (if Patient Has Fever And/or Copd)  05/10/2014   CLINICAL DATA:  Chest pain  EXAM: CHEST  2 VIEW  COMPARISON:  11/15/2013  FINDINGS: The heart size and mediastinal contours are within normal limits. Both lungs are clear. The visualized skeletal structures are unremarkable.  IMPRESSION: No active cardiopulmonary disease.   Electronically Signed   By: Marlan Palauharles  Clark M.D.   On: 05/10/2014 14:18     EKG Interpretation   Date/Time:  Thursday May 10 2014 13:19:56 EST Ventricular Rate:  111 PR Interval:  146 QRS Duration: 83 QT Interval:  318 QTC Calculation: 432 R Axis:   69 Text Interpretation:  Sinus tachycardia Anteroseptal infarct, old No acute  or new changes Confirmed by Rhunette CroftNANAVATI, MD, Janey GentaANKIT [phone]([id number]) on 05/10/2014  1:33:26 PM      MDM   Final diagnoses:  URI (upper respiratory infection)  Chest pain, unspecified chest pain type    Pt advised to pt to schedule with nutritionist.   I counseled on heart healthy diet.   I think current symptoms are uri.   I do not think MI or angina.   Pt is high risk and understands need for life changes and medication.  Pt advised to stop smokling   Lonia SkinnerLeslie K Port RoyalSofia, PA-C 05/10/14 1440  Derwood KaplanAnkit Nanavati, MD 05/10/14 [phone]

## 2014-05-10 NOTE — ED Notes (Signed)
Pt reports chest pain for past 4 days with radiation to L shoulder and back. Reports some sob, but speaking in complete sentences. No dizziness or light-headedness. Has also been having head/neck fullness, symptoms improved with thera-flu.

## 2014-05-10 NOTE — Discharge Instructions (Signed)
Chest Pain (Nonspecific) °It is often hard to give a specific diagnosis for the cause of chest pain. There is always a chance that your pain could be related to something serious, such as a heart attack or a blood clot in the lungs. You need to follow up with your health care provider for further evaluation. °CAUSES  °· Heartburn. °· Pneumonia or bronchitis. °· Anxiety or stress. °· Inflammation around your heart (pericarditis) or lung (pleuritis or pleurisy). °· A blood clot in the lung. °· A collapsed lung (pneumothorax). It can develop suddenly on its own (spontaneous pneumothorax) or from trauma to the chest. °· Shingles infection (herpes zoster virus). °The chest wall is composed of bones, muscles, and cartilage. Any of these can be the source of the pain. °· The bones can be bruised by injury. °· The muscles or cartilage can be strained by coughing or overwork. °· The cartilage can be affected by inflammation and become sore (costochondritis). °DIAGNOSIS  °Lab tests or other studies may be needed to find the cause of your pain. Your health care provider may have you take a test called an ambulatory electrocardiogram (ECG). An ECG records your heartbeat patterns over a 24-hour period. You may also have other tests, such as: °· Transthoracic echocardiogram (TTE). During echocardiography, sound waves are used to evaluate how blood flows through your heart. °· Transesophageal echocardiogram (TEE). °· Cardiac monitoring. This allows your health care provider to monitor your heart rate and rhythm in real time. °· Holter monitor. This is a portable device that records your heartbeat and can help diagnose heart arrhythmias. It allows your health care provider to track your heart activity for several days, if needed. °· Stress tests by exercise or by giving medicine that makes the heart beat faster. °TREATMENT  °· Treatment depends on what may be causing your chest pain. Treatment may include: °· Acid blockers for  heartburn. °· Anti-inflammatory medicine. °· Pain medicine for inflammatory conditions. °· Antibiotics if an infection is present. °· You may be advised to change lifestyle habits. This includes stopping smoking and avoiding alcohol, caffeine, and chocolate. °· You may be advised to keep your head raised (elevated) when sleeping. This reduces the chance of acid going backward from your stomach into your esophagus. °Most of the time, nonspecific chest pain will improve within 2-3 days with rest and mild pain medicine.  °HOME CARE INSTRUCTIONS  °· If antibiotics were prescribed, take them as directed. Finish them even if you start to feel better. °· For the next few days, avoid physical activities that bring on chest pain. Continue physical activities as directed. °· Do not use any tobacco products, including cigarettes, chewing tobacco, or electronic cigarettes. °· Avoid drinking alcohol. °· Only take medicine as directed by your health care provider. °· Follow your health care provider's suggestions for further testing if your chest pain does not go away. °· Keep any follow-up appointments you made. If you do not go to an appointment, you could develop lasting (chronic) problems with pain. If there is any problem keeping an appointment, call to reschedule. °SEEK MEDICAL CARE IF:  °· Your chest pain does not go away, even after treatment. °· You have a rash with blisters on your chest. °· You have a fever. °SEEK IMMEDIATE MEDICAL CARE IF:  °· You have increased chest pain or pain that spreads to your arm, neck, jaw, back, or abdomen. °· You have shortness of breath. °· You have an increasing cough, or you cough   up blood. °· You have severe back or abdominal pain. °· You feel nauseous or vomit. °· You have severe weakness. °· You faint. °· You have chills. °This is an emergency. Do not wait to see if the pain will go away. Get medical help at once. Call your local emergency services (911 in [place] Do not drive  yourself to the hospital. °MAKE SURE YOU:  °· Understand these instructions. °· Will watch your condition. °· Will get help right away if you are not doing well or get worse. °Document Released: 02/25/2005 Document Revised: 05/23/2013 Document Reviewed: 12/22/2007 °Exit[employer]® Patient Information ©2015 [employer]. This information is not intended to replace advice given to you by your health care provider. Make sure you discuss any questions you have with your health care provider. ° ° ° °Cardiac Diet °This diet can help prevent heart disease and stroke. Many factors influence your heart health, including eating and exercise habits. Coronary risk rises a lot with abnormal blood fat (lipid) levels. Cardiac meal planning includes limiting unhealthy fats, increasing healthy fats, and making other small dietary changes. General guidelines are as follows: °· Adjust calorie intake to reach and maintain desirable body weight. °· Limit total fat intake to less than 30% of total calories. Saturated fat should be less than 7% of calories. °· Saturated fats are found in animal products and in some vegetable products. Saturated vegetable fats are found in coconut oil, cocoa butter, palm oil, and palm kernel oil. Read labels carefully to avoid these products as much as possible. Use butter in moderation. Choose tub margarines and oils that have 2 grams of fat or less. Good cooking oils are canola and olive oils. °· Practice low-fat cooking techniques. Do not fry food. Instead, broil, bake, boil, steam, grill, roast on a rack, stir-fry, or microwave it. Other fat reducing suggestions include: °¨ Remove the skin from poultry. °¨ Remove all visible fat from meats. °¨ Skim the fat off stews, soups, and gravies before serving them. °¨ Steam vegetables in water or broth instead of sautéing them in fat. °· Avoid foods with trans fat (or hydrogenated oils), such as commercially fried foods and commercially baked goods. Commercial  shortening and deep-frying fats will contain trans fat. °· Increase intake of fruits, vegetables, whole grains, and legumes to replace foods high in fat. °¨ Increase consumption of nuts, legumes, and seeds to at least 4 servings weekly. One serving of a legume equals ½ cup, and 1 serving of nuts or seeds equals ¼ cup. °¨ Choose whole grains more often. Have 3 servings per day (a serving is 1 ounce [oz]). °¨ Eat 4 to 5 servings of vegetables per day. A serving of vegetables is 1 cup of raw leafy vegetables; ½ cup of raw or cooked cut-up vegetables; ½ cup of vegetable juice. °¨ Eat 4 to 5 servings of fruit per day. A serving of fruit is 1 medium whole fruit; ¼ cup of dried fruit; ½ cup of fresh, frozen, or canned fruit; ½ cup of 100% fruit juice. °· Increase your intake of dietary fiber to 20 to 30 grams per day. Insoluble fiber may help lower your risk of heart disease and may help curb your appetite.  °Soluble fiber binds cholesterol to be removed from the blood. Foods high in soluble fiber are dried beans, citrus fruits, oats, apples, bananas, broccoli, Brussels sprouts, and eggplant. °· Try to include foods fortified with plant sterols or stanols, such as yogurt, breads, juices, or margarines. Choose several   fortified foods to achieve a daily intake of 2 to 3 grams of plant sterols or stanols. °· Foods with omega-3 fats can help reduce your risk of heart disease. Aim to have a 3.5 oz portion of fatty fish twice per week, such as salmon, mackerel, albacore tuna, sardines, lake trout, or herring. If you wish to take a fish oil supplement, choose one that contains 1 gram of both DHA and EPA. °· Limit processed meats to 2 servings (3 oz portion) weekly. °· Limit the sodium in your diet to 1500 milligrams (mg) per day. If you have high blood pressure, talk to a registered dietitian about a DASH (Dietary Approaches to Stop Hypertension) eating plan. °· Limit sweets and beverages with added sugar, such as soda, to no  more than 5 servings per week. One serving is:   °¨ 1 tablespoon sugar. °¨ 1 tablespoon jelly or jam. °¨ ½ cup sorbet. °¨ 1 cup lemonade. °¨ ½ cup regular soda. °CHOOSING FOODS °Starches °· Allowed: Breads: All kinds (wheat, rye, raisin, white, oatmeal, Italian, French, and English muffin bread). Low-fat rolls: English muffins, frankfurter and hamburger buns, bagels, pita bread, tortillas (not fried). Pancakes, waffles, biscuits, and muffins made with recommended oil. °· Avoid: Products made with saturated or trans fats, oils, or whole milk products. Butter rolls, cheese breads, croissants. Commercial doughnuts, muffins, sweet rolls, biscuits, waffles, pancakes, store-bought mixes. °Crackers °· Allowed: Low-fat crackers and snacks: Animal, graham, rye, saltine (with recommended oil, no lard), oyster, and matzo crackers. Bread sticks, melba toast, rusks, flatbread, pretzels, and light popcorn. °· Avoid: High-fat crackers: cheese crackers, butter crackers, and those made with coconut, palm oil, or trans fat (hydrogenated oils). Buttered popcorn. °Cereals °· Allowed: Hot or cold whole-grain cereals. °· Avoid: Cereals containing coconut, hydrogenated vegetable fat, or animal fat. °Potatoes / Pasta / Rice °· Allowed: All kinds of potatoes, rice, and pasta (such as macaroni, spaghetti, and noodles). °· Avoid: Pasta or rice prepared with cream sauce or high-fat cheese. Chow mein noodles, French fries. °Vegetables °· Allowed: All vegetables and vegetable juices. °· Avoid: Fried vegetables. Vegetables in cream, butter, or high-fat cheese sauces. Limit coconut. Fruit in cream or custard. °Protein °· Allowed: Limit your intake of meat, seafood, and poultry to no more than 6 oz (cooked weight) per day. All lean, well-trimmed beef, veal, pork, and lamb. All chicken and turkey without skin. All fish and shellfish. Wild game: wild duck, rabbit, pheasant, and venison. Egg whites or low-cholesterol egg substitutes may be used as  desired. Meatless dishes: recipes with dried beans, peas, lentils, and tofu (soybean curd). Seeds and nuts: all seeds and most nuts. °· Avoid: Prime grade and other heavily marbled and fatty meats, such as short ribs, spare ribs, rib eye roast or steak, frankfurters, sausage, bacon, and high-fat luncheon meats, mutton. Caviar. Commercially fried fish. Domestic duck, goose, venison sausage. Organ meats: liver, gizzard, heart, chitterlings, brains, kidney, sweetbreads. °Dairy °· Allowed: Low-fat cheeses: nonfat or low-fat cottage cheese (1% or 2% fat), cheeses made with part skim milk, such as mozzarella, farmers, string, or ricotta. (Cheeses should be labeled no more than 2 to 6 grams fat per oz.). Skim (or 1%) milk: liquid, powdered, or evaporated. Buttermilk made with low-fat milk. Drinks made with skim or low-fat milk or cocoa. Chocolate milk or cocoa made with skim or low-fat (1%) milk. Nonfat or low-fat yogurt. °· Avoid: Whole milk cheeses, including colby, cheddar, muenster, Monterey Jack, Havarti, Brie, Camembert, American, Swiss, and blue. Creamed cottage cheese, cream cheese.   Whole milk and whole milk products, including buttermilk or yogurt made from whole milk, drinks made from whole milk. Condensed milk, evaporated whole milk, and 2% milk. °Soups and Combination Foods °· Allowed: Low-fat low-sodium soups: broth, dehydrated soups, homemade broth, soups with the fat removed, homemade cream soups made with skim or low-fat milk. Low-fat spaghetti, lasagna, chili, and Spanish rice if low-fat ingredients and low-fat cooking techniques are used. °· Avoid: Cream soups made with whole milk, cream, or high-fat cheese. All other soups. °Desserts and Sweets °· Allowed: Sherbet, fruit ices, gelatins, meringues, and angel food cake. Homemade desserts with recommended fats, oils, and milk products. Jam, jelly, honey, marmalade, sugars, and syrups. Pure sugar candy, such as gum drops, hard candy, jelly beans,  marshmallows, mints, and small amounts of dark chocolate. °· Avoid: Commercially prepared cakes, pies, cookies, frosting, pudding, or mixes for these products. Desserts containing whole milk products, chocolate, coconut, lard, palm oil, or palm kernel oil. Ice cream or ice cream drinks. Candy that contains chocolate, coconut, butter, hydrogenated fat, or unknown ingredients. Buttered syrups. °Fats and Oils °· Allowed: Vegetable oils: safflower, sunflower, corn, soybean, cottonseed, sesame, canola, olive, or peanut. Non-hydrogenated margarines. Salad dressing or mayonnaise: homemade or commercial, made with a recommended oil. Low or nonfat salad dressing or mayonnaise. °¨ Limit added fats and oils to 6 to 8 tsp per day (includes fats used in cooking, baking, salads, and spreads on bread). Remember to count the "hidden fats" in foods. °· Avoid: Solid fats and shortenings: butter, lard, salt pork, bacon drippings. Gravy containing meat fat, shortening, or suet. Cocoa butter, coconut. Coconut oil, palm oil, palm kernel oil, or hydrogenated oils: these ingredients are often used in bakery products, nondairy creamers, whipped toppings, candy, and commercially fried foods. Read labels carefully. Salad dressings made of unknown oils, sour cream, or cheese, such as blue cheese and Roquefort. Cream, all kinds: half-and-half, light, heavy, or whipping. Sour cream or cream cheese (even if "light" or low-fat). Nondairy cream substitutes: coffee creamers and sour cream substitutes made with palm, palm kernel, hydrogenated oils, or coconut oil. °Beverages °· Allowed: Coffee (regular or decaffeinated), tea. Diet carbonated beverages, mineral water. Alcohol: Check with your caregiver. Moderation is recommended. °· Avoid: Whole milk, regular sodas, and juice drinks with added sugar. °Condiments °· Allowed: All seasonings and condiments. Cocoa powder. "Cream" sauces made with recommended ingredients. °· Avoid: Carob powder made with  hydrogenated fats. °SAMPLE MENU °Breakfast °· ½ cup orange juice °· ½ cup oatmeal °· 1 slice toast °· 1 tsp margarine °· 1 cup skim milk °Lunch °· Turkey sandwich with 2 oz turkey, 2 slices bread °· Lettuce and tomato slices °· Fresh fruit °· Carrot sticks °· Coffee or tea °Snack °· Fresh fruit or low-fat crackers °Dinner °· 3 oz lean ground beef °· 1 baked potato °· 1 tsp margarine °· ½ cup asparagus °· Lettuce salad °· 1 tbs non-creamy dressing °· ½ cup peach slices °· 1 cup skim milk °Document Released: 02/25/2008 Document Revised: 11/17/2011 Document Reviewed: 07/18/2013 °[employer]® Patient Information ©2015 [employer]. This information is not intended to replace advice given to you by your health care provider. Make sure you discuss any questions you have with your health care provider. ° °

## 2014-07-25 ENCOUNTER — Emergency Department (HOSPITAL_COMMUNITY)
Admission: EM | Admit: 2014-07-25 | Discharge: 2014-07-25 | Disposition: A | Discharge status: Home / Self Care | Payer: Medicaid Other | Attending: Emergency Medicine | Admitting: Emergency Medicine

## 2014-07-25 ENCOUNTER — Encounter (HOSPITAL_COMMUNITY): Payer: Self-pay | Admitting: Emergency Medicine

## 2014-07-25 ENCOUNTER — Emergency Department (HOSPITAL_COMMUNITY): Payer: Medicaid Other

## 2014-07-25 DIAGNOSIS — Z8619 Personal history of other infectious and parasitic diseases: Secondary | ICD-10-CM | POA: Insufficient documentation

## 2014-07-25 DIAGNOSIS — G5602 Carpal tunnel syndrome, left upper limb: Secondary | ICD-10-CM | POA: Insufficient documentation

## 2014-07-25 DIAGNOSIS — Z720 Tobacco use: Secondary | ICD-10-CM | POA: Diagnosis not present

## 2014-07-25 DIAGNOSIS — E780 Pure hypercholesterolemia: Secondary | ICD-10-CM | POA: Diagnosis not present

## 2014-07-25 DIAGNOSIS — G5603 Carpal tunnel syndrome, bilateral upper limbs: Secondary | ICD-10-CM

## 2014-07-25 DIAGNOSIS — F419 Anxiety disorder, unspecified: Secondary | ICD-10-CM | POA: Insufficient documentation

## 2014-07-25 DIAGNOSIS — R0789 Other chest pain: Secondary | ICD-10-CM | POA: Insufficient documentation

## 2014-07-25 DIAGNOSIS — I10 Essential (primary) hypertension: Secondary | ICD-10-CM | POA: Diagnosis not present

## 2014-07-25 DIAGNOSIS — Z79899 Other long term (current) drug therapy: Secondary | ICD-10-CM | POA: Insufficient documentation

## 2014-07-25 DIAGNOSIS — G5601 Carpal tunnel syndrome, right upper limb: Secondary | ICD-10-CM | POA: Insufficient documentation

## 2014-07-25 DIAGNOSIS — R079 Chest pain, unspecified: Secondary | ICD-10-CM | POA: Diagnosis present

## 2014-07-25 LAB — BASIC METABOLIC PANEL
Anion gap: 9 (ref 5–15)
BUN: 8 mg/dL (ref 6–23)
CALCIUM: 9.6 mg/dL (ref 8.4–10.5)
CO2: 27 mmol/L (ref 19–32)
CREATININE: 0.59 mg/dL (ref 0.50–1.35)
Chloride: 98 mmol/L (ref 96–112)
GFR calc non Af Amer: >90 mL/min (ref >90)
GLUCOSE: 81 mg/dL (ref 70–99)
Potassium: 3.7 mmol/L (ref 3.5–5.1)
Sodium: 134 mmol/L — ABNORMAL LOW (ref 135–145)

## 2014-07-25 LAB — CBC
HEMATOCRIT: 43.1 % (ref 39.0–52.0)
Hemoglobin: 14.4 g/dL (ref 13.0–17.0)
MCH: 29.9 pg (ref 26.0–34.0)
MCHC: 33.4 g/dL (ref 30.0–36.0)
MCV: 89.6 fL (ref 78.0–100.0)
Platelets: 331 10*3/uL (ref 150–400)
RBC: 4.81 MIL/uL (ref 4.22–5.81)
RDW: 13.3 % (ref 11.5–15.5)
WBC: 15.3 10*3/uL — ABNORMAL HIGH (ref 4.0–10.5)

## 2014-07-25 LAB — I-STAT TROPONIN, ED: Troponin i, poc: 0 ng/mL (ref 0.00–0.08)

## 2014-07-25 MED ORDER — METHOCARBAMOL 500 MG PO TABS: ORAL_TABLET | ORAL | Status: DC

## 2014-07-25 MED ORDER — NAPROXEN 500 MG PO TABS
500.0000 mg | ORAL_TABLET | Freq: Once | ORAL | Status: AC
Start: 2014-07-25 — End: 2014-07-25
  Administered 2014-07-25: 500 mg via ORAL
  Filled 2014-07-25: qty 1

## 2014-07-25 MED ORDER — CYCLOBENZAPRINE HCL 10 MG PO TABS
5.0000 mg | ORAL_TABLET | Freq: Three times a day (TID) | ORAL | Status: DC | PRN
  Administered 2014-07-25: 5 mg via ORAL
  Filled 2014-07-25: qty 1

## 2014-07-25 MED ORDER — IBUPROFEN 600 MG PO TABS: 600.0000 mg | ORAL_TABLET | Freq: Four times a day (QID) | ORAL | Status: DC | PRN

## 2014-07-25 NOTE — ED Provider Notes (Signed)
CSN: [id number]     Arrival date & time 07/25/14  0220 History   First MD Initiated Contact with Patient 07/25/14 [phone]     Chief Complaint  Patient presents with  . Chest Pain     (Consider location/radiation/quality/duration/timing/severity/associated sxs/prior Treatment) HPI  Patient is here complaining of chest pain that started 4 days ago. He states the pain moves around his chest and is in his neck and in his shoulders. The pain comes and goes and last about 30 minutes. She states "I have muscle pain". He reports he started working out at [employer] 4 days ago and then again yesterday. He reports he feels tired. He also states he had quit smoking however he has restarted smoking. He states when he coughs last couple days there is a speck of blood in the clear mucus. He denies increasing cough or fever. He also complains of numbness in both his hands that wakes him up at night. He states tonight he had a sticking pain in his heart.  Patient states he uses crutches because a history of polio. 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   PCP Dr. Lerry Liner  Past Medical History  Diagnosis Date  . Polio   . Hypertension   . Hypercholesteremia    Past Surgical History  Procedure Laterality Date  . Hip surgery Left 1987    for polio, not sure what was done  . Knee surgery Left 1987    for polio   Family History  Problem Relation Age of Onset  . Hypertension Mother   . Heart failure Father    History  Substance Use Topics  . Smoking status: Current Every Day Smoker -- 2.00 packs/day    Types: Cigarettes  . Smokeless tobacco: Not on file  . Alcohol Use: Yes     Comment: occ   uses crutches because of his history of polio Patient drives a taxi  Review of Systems  All other systems reviewed and are negative.     Allergies  Review of patient's allergies indicates no known allergies.  Home Medications   Prior to Admission medications   Medication Sig  Start Date End Date Taking? Authorizing Provider  Chlorphen-Pseudoephed-APAP (THERAFLU FLU/COLD PO) Take 1 packet by mouth every 6 (six) hours as needed (cold symptoms).   Yes Historical Provider, MD  hydrochlorothiazide (HYDRODIURIL) 25 MG tablet Take 25 mg by mouth daily.    Yes Historical Provider, MD  lisinopril (PRINIVIL,ZESTRIL) 40 MG tablet Take 40 mg by mouth daily.     Yes Historical Provider, MD  lovastatin (MEVACOR) 20 MG tablet Take 20 mg by mouth at bedtime.   Yes Historical Provider, MD  tadalafil (CIALIS) 10 MG tablet Take 10 mg by mouth daily as needed for erectile dysfunction.   Yes Historical Provider, MD  ibuprofen (ADVIL,MOTRIN) 600 MG tablet Take 1 tablet (600 mg total) by mouth every 6 (six) hours as needed. 07/25/14   Ward Givens, MD  methocarbamol (ROBAXIN) 500 MG tablet Take 1 or 2 po QID for soreness in muscles 07/25/14   Ward Givens, MD   BP 111/67 mmHg  Pulse 99  Temp(Src) 97.8 F (36.6 C) (Oral)  Resp 18  SpO2 98%  Vital signs normal   Physical Exam  Constitutional: He is oriented to person, place, and time. He appears well-developed and well-nourished.  Non-toxic appearance. He does not appear ill. No distress.  HENT:  Head: Normocephalic and atraumatic.  Right Ear:  External ear normal.  Left Ear: External ear normal.  Nose: Nose normal. No mucosal edema or rhinorrhea.  Mouth/Throat: Oropharynx is clear and moist and mucous membranes are normal. No dental abscesses or uvula swelling.  Eyes: Conjunctivae and EOM are normal. Pupils are equal, round, and reactive to light.  Neck: Normal range of motion and full passive range of motion without pain. Neck supple.    Tender bilateral trapezius muscles  Cardiovascular: Normal rate, regular rhythm and normal heart sounds.  Exam reveals no gallop and no friction rub.   No murmur heard. Pulmonary/Chest: Effort normal and breath sounds normal. No respiratory distress. He has no wheezes. He has no rhonchi. He has no  rales. He exhibits no tenderness and no crepitus.  Abdominal: Soft. Normal appearance and bowel sounds are normal. He exhibits no distension. There is no tenderness. There is no rebound and no guarding.  Musculoskeletal: Normal range of motion. He exhibits no edema or tenderness.  Moves all extremities well.  No Phalen's or Tennel's sign  Neurological: He is alert and oriented to person, place, and time. He has normal strength. No cranial nerve deficit.  Skin: Skin is warm, dry and intact. No rash noted. No erythema. No pallor.  Psychiatric: His speech is normal and behavior is normal. His mood appears anxious.  Nursing note and vitals reviewed.   ED Course  Procedures (including critical care time)  Medications  cyclobenzaprine (FLEXERIL) tablet 5 mg (5 mg Oral Given 07/25/14 0349)  naproxen (NAPROSYN) tablet 500 mg (500 mg Oral Given 07/25/14 0349)    Patient reassured this is not his heart causing his discomfort. I suspect his workout at the gym is made his muscles sore. He does describe what could be carpal tunnel syndrome and will be referred to hands.  Labs Review Results for orders placed or performed during the hospital encounter of 07/25/14  CBC  Result Value Ref Range   WBC 15.3 (H) 4.0 - 10.5 K/uL   RBC 4.81 4.22 - 5.81 MIL/uL   Hemoglobin 14.4 13.0 - 17.0 g/dL   HCT 16.1 09.6 - 04.5 %   MCV 89.6 78.0 - 100.0 fL   MCH 29.9 26.0 - 34.0 pg   MCHC 33.4 30.0 - 36.0 g/dL   RDW 40.9 81.1 - 91.4 %   Platelets 331 150 - 400 K/uL  Basic metabolic panel  Result Value Ref Range   Sodium 134 (L) 135 - 145 mmol/L   Potassium 3.7 3.5 - 5.1 mmol/L   Chloride 98 96 - 112 mmol/L   CO2 27 19 - 32 mmol/L   Glucose, Bld 81 70 - 99 mg/dL   BUN 8 6 - 23 mg/dL   Creatinine, Ser 7.82 0.50 - 1.35 mg/dL   Calcium 9.6 8.4 - 95.6 mg/dL   GFR calc non Af Amer >90 >90 mL/min   GFR calc Af Amer >90 >90 mL/min   Anion gap 9 5 - 15  I-stat troponin, ED (not at [facility])  Result Value Ref Range    Troponin i, poc 0.00 0.00 - 0.08 ng/mL   Comment 3             Laboratory interpretation all normal except leukocytosis   Imaging Review Dg Chest Port 1 View  07/25/2014   CLINICAL DATA:  Chest pain for 4 days.  EXAM: PORTABLE CHEST - 1 VIEW  COMPARISON:  Frontal and lateral views 05/10/2014  FINDINGS: Lung volumes are low. The cardiomediastinal contours are normal. Pulmonary vasculature is  normal. No consolidation, pleural effusion, or pneumothorax. No acute osseous abnormalities are seen. Mild scoliotic curvature of the upper thoracic spine.  IMPRESSION: No acute pulmonary process.   Electronically Signed   By: Rubye OaksMelanie  Ehinger M.D.   On: 07/25/2014 03:52     EKG Interpretation   Date/Time:  Wednesday July 25 2014 02:26:37 EST Ventricular Rate:  103 PR Interval:  137 QRS Duration: 82 QT Interval:  337 QTC Calculation: 441 R Axis:   74 Text Interpretation:  Sinus tachycardia Probable anteroseptal infarct, old  Minimal ST elevation, inferior leads No significant change since last  tracing 10 May 2014 Confirmed by [place] Mead[place] LLCKNAPP  MD-I, Linzie Criss ([id number]) on 07/25/2014  3:16:47 AM      MDM   Final diagnoses:  Carpal tunnel syndrome on both sides  Musculoskeletal chest pain    New Prescriptions   IBUPROFEN (ADVIL,MOTRIN) 600 MG TABLET    Take 1 tablet (600 mg total) by mouth every 6 (six) hours as needed.   METHOCARBAMOL (ROBAXIN) 500 MG TABLET    Take 1 or 2 po QID for soreness in muscles    Plan discharge   Devoria AlbeIva Kinslie Hove, MD, Franz DellFACEP     Ailana Cuadrado L Alphia Behanna, MD 07/25/14 [phone]

## 2014-07-25 NOTE — ED Notes (Signed)
Pt reports he has been having stabbing chest pain x4 days. Pt reports 4 days ago he coughed up a small amount of blood. Pt reports sometimes he wakes up with pain hands as well.

## 2014-07-25 NOTE — Discharge Instructions (Signed)
Try ice and heat on your sore muscles. Take the medications as prescribed. You can call Dr Carlos LeveringGramig's office to get an appointment to evaluate your hand numbness for carpal tunnel syndrome. STOP SMOKING!!!    Carpal Tunnel Syndrome The carpal tunnel is an area under the skin of the palm of your hand. Nerves, blood vessels, and strong tissues (tendons) pass through the tunnel. The tunnel can become puffy (swollen). If this happens, a nerve can be pinched in the wrist. This causes carpal tunnel syndrome.  HOME CARE  Take all medicine as told by your doctor.  If you were given a splint, wear it as told. Wear it at night or at times when your doctor told you to.  Rest your wrist from the activity that causes your pain.  Put ice on your wrist after long periods of wrist activity.  Put ice in a plastic bag.  Place a towel between your skin and the bag.  Leave the ice on for 15-20 minutes, 03-04 times a day.  Keep all doctor visits as told. GET HELP RIGHT AWAY IF:  You have new problems you cannot explain.  Your problems get worse and medicine does not help. MAKE SURE YOU:   Understand these instructions.  Will watch your condition.  Will get help right away if you are not doing well or get worse. Document Released: 05/07/2011 Document Revised: 08/10/2011 Document Reviewed: 05/07/2011 [facility] Patient Information 2015 [place], [place]. This information is not intended to replace advice given to you by your health care provider. Make sure you discuss any questions you have with your health care provider.  Chest Pain (Nonspecific) It is often hard to give a diagnosis for the cause of chest pain.  You need to follow up with your doctor. HOME CARE  For the next few days, avoid activities that bring on chest pain. Continue physical activities as told by your doctor.  Do not use any tobacco products. This includes cigarettes, chewing tobacco, and e-cigarettes.  Avoid drinking  alcohol.  Only take medicine as told by your doctor.  Follow your doctor's suggestions for more testing if your chest pain does not go away.  Keep all doctor visits you made. GET HELP IF:  Your chest pain does not go away, even after treatment.  You have a rash with blisters on your chest.  You have a fever. GET HELP RIGHT AWAY IF:   You have more pain or pain that spreads to your arm, neck, jaw, back, or belly (abdomen).  You have shortness of breath.  You cough more than usual or cough up blood.  You have very bad back or belly pain.  You feel sick to your stomach (nauseous) or throw up (vomit).  You have very bad weakness.  You pass out (faint).  You have chills. This is an emergency. Do not wait to see if the problems will go away. Call your local emergency services (911 in [place] Do not drive yourself to the hospital. MAKE SURE YOU:   Understand these instructions.  Will watch your condition.  Will get help right away if you are not doing well or get worse. Document Released: 11/04/2007 Document Revised: 05/23/2013 Document Reviewed: 11/04/2007 [facility] Patient Information 2015 [place], [place]. This information is not intended to replace advice given to you by your health care provider. Make sure you discuss any questions you have with your health care provider.

## 2014-09-15 ENCOUNTER — Encounter (HOSPITAL_COMMUNITY): Payer: Self-pay | Admitting: Emergency Medicine

## 2014-09-15 ENCOUNTER — Emergency Department (HOSPITAL_COMMUNITY)
Admission: EM | Admit: 2014-09-15 | Discharge: 2014-09-15 | Disposition: A | Discharge status: Home / Self Care | Payer: Medicaid Other | Attending: Emergency Medicine | Admitting: Emergency Medicine

## 2014-09-15 ENCOUNTER — Emergency Department (HOSPITAL_COMMUNITY): Payer: Medicaid Other

## 2014-09-15 DIAGNOSIS — E780 Pure hypercholesterolemia: Secondary | ICD-10-CM | POA: Insufficient documentation

## 2014-09-15 DIAGNOSIS — I10 Essential (primary) hypertension: Secondary | ICD-10-CM | POA: Diagnosis not present

## 2014-09-15 DIAGNOSIS — Z79899 Other long term (current) drug therapy: Secondary | ICD-10-CM | POA: Insufficient documentation

## 2014-09-15 DIAGNOSIS — Z720 Tobacco use: Secondary | ICD-10-CM | POA: Diagnosis not present

## 2014-09-15 DIAGNOSIS — R079 Chest pain, unspecified: Secondary | ICD-10-CM

## 2014-09-15 DIAGNOSIS — R319 Hematuria, unspecified: Secondary | ICD-10-CM | POA: Insufficient documentation

## 2014-09-15 DIAGNOSIS — F419 Anxiety disorder, unspecified: Secondary | ICD-10-CM | POA: Insufficient documentation

## 2014-09-15 DIAGNOSIS — Z8612 Personal history of poliomyelitis: Secondary | ICD-10-CM | POA: Diagnosis not present

## 2014-09-15 LAB — BASIC METABOLIC PANEL
Anion gap: 7 (ref 5–15)
BUN: 10 mg/dL (ref 6–23)
CALCIUM: 9.5 mg/dL (ref 8.4–10.5)
CHLORIDE: 105 mmol/L (ref 96–112)
CO2: 27 mmol/L (ref 19–32)
CREATININE: 0.58 mg/dL (ref 0.50–1.35)
GFR calc non Af Amer: >90 mL/min (ref >90)
Glucose, Bld: 108 mg/dL — ABNORMAL HIGH (ref 70–99)
Potassium: 3.6 mmol/L (ref 3.5–5.1)
SODIUM: 139 mmol/L (ref 135–145)

## 2014-09-15 LAB — URINALYSIS, ROUTINE W REFLEX MICROSCOPIC
Bilirubin Urine: NEGATIVE
Glucose, UA: NEGATIVE mg/dL
Hgb urine dipstick: NEGATIVE
Ketones, ur: NEGATIVE mg/dL
Leukocytes, UA: NEGATIVE
NITRITE: NEGATIVE
PROTEIN: NEGATIVE mg/dL
Specific Gravity, Urine: 1.007 (ref 1.005–1.030)
Urobilinogen, UA: 0.2 mg/dL (ref 0.0–1.0)
pH: 7 (ref 5.0–8.0)

## 2014-09-15 LAB — CBC
HCT: 41.1 % (ref 39.0–52.0)
Hemoglobin: 14 g/dL (ref 13.0–17.0)
MCH: 30.6 pg (ref 26.0–34.0)
MCHC: 34.1 g/dL (ref 30.0–36.0)
MCV: 89.9 fL (ref 78.0–100.0)
Platelets: 238 10*3/uL (ref 150–400)
RBC: 4.57 MIL/uL (ref 4.22–5.81)
RDW: 12.7 % (ref 11.5–15.5)
WBC: 12.8 10*3/uL — ABNORMAL HIGH (ref 4.0–10.5)

## 2014-09-15 LAB — I-STAT TROPONIN, ED: Troponin i, poc: 0 ng/mL (ref 0.00–0.08)

## 2014-09-15 MED ORDER — ASPIRIN 81 MG PO CHEW
324.0000 mg | CHEWABLE_TABLET | Freq: Once | ORAL | Status: AC
  Administered 2014-09-15: 324 mg via ORAL
  Filled 2014-09-15: qty 4

## 2014-09-15 NOTE — Discharge Instructions (Signed)
Chest Pain (Nonspecific) Follow up with your primary care provider. Speak to your provider about a stress test for further evaluation of your chest pain.  It is often hard to give a specific diagnosis for the cause of chest pain. There is always a chance that your pain could be related to something serious, such as a heart attack or a blood clot in the lungs. You need to follow up with your health care provider for further evaluation. CAUSES   Heartburn.  Pneumonia or bronchitis.  Anxiety or stress.  Inflammation around your heart (pericarditis) or lung (pleuritis or pleurisy).  A blood clot in the lung.  A collapsed lung (pneumothorax). It can develop suddenly on its own (spontaneous pneumothorax) or from trauma to the chest.  Shingles infection (herpes zoster virus). The chest wall is composed of bones, muscles, and cartilage. Any of these can be the source of the pain.  The bones can be bruised by injury.  The muscles or cartilage can be strained by coughing or overwork.  The cartilage can be affected by inflammation and become sore (costochondritis). DIAGNOSIS  Lab tests or other studies may be needed to find the cause of your pain. Your health care provider may have you take a test called an ambulatory electrocardiogram (ECG). An ECG records your heartbeat patterns over a 24-hour period. You may also have other tests, such as:  Transthoracic echocardiogram (TTE). During echocardiography, sound waves are used to evaluate how blood flows through your heart.  Transesophageal echocardiogram (TEE).  Cardiac monitoring. This allows your health care provider to monitor your heart rate and rhythm in real time.  Holter monitor. This is a portable device that records your heartbeat and can help diagnose heart arrhythmias. It allows your health care provider to track your heart activity for several days, if needed.  Stress tests by exercise or by giving medicine that makes the heart  beat faster. TREATMENT   Treatment depends on what may be causing your chest pain. Treatment may include:  Acid blockers for heartburn.  Anti-inflammatory medicine.  Pain medicine for inflammatory conditions.  Antibiotics if an infection is present.  You may be advised to change lifestyle habits. This includes stopping smoking and avoiding alcohol, caffeine, and chocolate.  You may be advised to keep your head raised (elevated) when sleeping. This reduces the chance of acid going backward from your stomach into your esophagus. Most of the time, nonspecific chest pain will improve within 2-3 days with rest and mild pain medicine.  HOME CARE INSTRUCTIONS   If antibiotics were prescribed, take them as directed. Finish them even if you start to feel better.  For the next few days, avoid physical activities that bring on chest pain. Continue physical activities as directed.  Do not use any tobacco products, including cigarettes, chewing tobacco, or electronic cigarettes.  Avoid drinking alcohol.  Only take medicine as directed by your health care provider.  Follow your health care provider's suggestions for further testing if your chest pain does not go away.  Keep any follow-up appointments you made. If you do not go to an appointment, you could develop lasting (chronic) problems with pain. If there is any problem keeping an appointment, call to reschedule. SEEK MEDICAL CARE IF:   Your chest pain does not go away, even after treatment.  You have a rash with blisters on your chest.  You have a fever. SEEK IMMEDIATE MEDICAL CARE IF:   You have increased chest pain or pain that spreads  to your arm, neck, jaw, back, or abdomen.  You have shortness of breath.  You have an increasing cough, or you cough up blood.  You have severe back or abdominal pain.  You feel nauseous or vomit.  You have severe weakness.  You faint.  You have chills. This is an emergency. Do not wait  to see if the pain will go away. Get medical help at once. Call your local emergency services (911 in [place] Do not drive yourself to the hospital. MAKE SURE YOU:   Understand these instructions.  Will watch your condition.  Will get help right away if you are not doing well or get worse. Document Released: 02/25/2005 Document Revised: 05/23/2013 Document Reviewed: 12/22/2007 [facility] Patient Information 2015 [place], [place]. This information is not intended to replace advice given to you by your health care provider. Make sure you discuss any questions you have with your health care provider.

## 2014-09-15 NOTE — ED Provider Notes (Signed)
CSN: [id number]     Arrival date & time 09/15/14  1414 History   First MD Initiated Contact with Patient 09/15/14 1450     Chief Complaint  Patient presents with  . Chest Pain     (Consider location/radiation/quality/duration/timing/severity/associated sxs/prior Treatment) Patient is a 44 y.o. male presenting with chest pain. The history is provided by the patient. No language interpreter was used.  Chest Pain Associated symptoms: no weakness   John Mcintyre is a 44 y.o male with polio, HTN and hyperlipidemia who presents for gradual onset constant chest pain that radiates to both arms and neck for the past 6 days.  Nothing makes it better or worse. He has not had any treatment prior to coming to the ED. He denies any fever, diaphoresis, cough, shortness of breath, abdominal pain, nausea, vomiting, or leg swelling. His father died before the age of 83 from heart disease. He was seen in February for the same and given muscle relaxers and ortho follow up but came in with the unfilled prescriptions today.  He states he doesn't want to wait there all day. 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  He also used crutches to ambulate due to polio. He smokes 1ppd.   Past Medical History  Diagnosis Date  . Polio   . Hypertension   . Hypercholesteremia    Past Surgical History  Procedure Laterality Date  . Hip surgery Left 1987    for polio, not sure what was done  . Knee surgery Left 1987    for polio   Family History  Problem Relation Age of Onset  . Hypertension Mother   . Heart failure Father    History  Substance Use Topics  . Smoking status: Current Every Day Smoker -- 2.00 packs/day    Types: Cigarettes  . Smokeless tobacco: Not on file  . Alcohol Use: Yes     Comment: occ    Review of Systems  Cardiovascular: Positive for chest pain.  Genitourinary: Positive for  hematuria. Negative for dysuria and frequency.  Neurological: Negative for syncope, weakness and light-headedness.  All other systems reviewed and are negative.     Allergies  Review of patient's allergies indicates no known allergies.  Home Medications   Prior to Admission medications   Medication Sig Start Date End Date Taking? Authorizing Provider  hydrochlorothiazide (HYDRODIURIL) 25 MG tablet Take 25 mg by mouth daily.    Yes Historical Provider, MD  lisinopril (PRINIVIL,ZESTRIL) 40 MG tablet Take 40 mg by mouth daily.     Yes Historical Provider, MD  lovastatin (MEVACOR) 20 MG tablet Take 20 mg by mouth at bedtime.   Yes Historical Provider, MD  omega-3 acid ethyl esters (LOVAZA) 1 G capsule Take 1 g by mouth daily.   Yes Historical Provider, MD  tadalafil (CIALIS) 10 MG tablet Take 10 mg by mouth daily as needed for erectile dysfunction.   Yes Historical Provider, MD  ibuprofen (ADVIL,MOTRIN) 600 MG tablet Take 1 tablet (600 mg total) by mouth every 6 (six) hours as needed. 07/25/14   Devoria Albe, MD  methocarbamol (ROBAXIN) 500 MG tablet Take 1 or 2 po QID for soreness in muscles Patient not taking: Reported on 09/15/2014 07/25/14   Devoria Albe, MD   BP 119/76 mmHg  Pulse 80  Temp(Src) 98 F (36.7 C) (Oral)  Resp 18  SpO2  97% Physical Exam  Constitutional: He is oriented to person, place, and time. He appears well-developed and well-nourished.  HENT:  Head: Normocephalic and atraumatic.  Eyes: Conjunctivae are normal.  Neck: Normal range of motion. Neck supple.  Cardiovascular: Normal rate, regular rhythm and normal heart sounds.   Pulmonary/Chest: Effort normal and breath sounds normal. No respiratory distress. He has no wheezes. He has no rales. He exhibits no tenderness.  Abdominal: Soft. There is no tenderness.  Musculoskeletal: Normal range of motion. He exhibits no edema.  Neurological: He is alert and oriented to person, place, and time.  Skin: Skin is warm and dry.   Psychiatric: His mood appears anxious.  Nursing note and vitals reviewed.   ED Course  Procedures (including critical care time) Labs Review Labs Reviewed  CBC - Abnormal; Notable for the following:    WBC 12.8 (*)    All other components within normal limits  BASIC METABOLIC PANEL - Abnormal; Notable for the following:    Glucose, Bld 108 (*)    All other components within normal limits  URINALYSIS, ROUTINE W REFLEX MICROSCOPIC  I-STAT TROPOININ, ED    Imaging Review Dg Chest Port 1 View  09/15/2014   CLINICAL DATA:  Chest pain, headache, dizziness and weakness for 7 days.  EXAM: PORTABLE CHEST - 1 VIEW  COMPARISON:  07/25/2014  FINDINGS: The cardiac silhouette, mediastinal and hilar contours are within normal limits stable. The lungs are clear. No pleural effusion. The bony thorax is intact.  IMPRESSION: No acute cardiopulmonary findings.   Electronically Signed   By: Rudie MeyerP.  Gallerani M.D.   On: 09/15/2014 14:42     EKG Interpretation   Date/Time:  Saturday September 15 2014 14:19:36 EDT Ventricular Rate:  109 PR Interval:  151 QRS Duration: 84 QT Interval:  323 QTC Calculation: 435 R Axis:   68 Text Interpretation:  Sinus tachycardia Probable left atrial enlargement  Anteroseptal infarct, old No significant change was found Confirmed by  Manus GunningANCOUR  MD, STEPHEN [phone]([id number]) on 09/15/2014 2:24:29 PM      MDM   Final diagnoses:  Chest pain, unspecified chest pain type   Patient presents for chest pain that began 6 days ago.  He is concerned that he may have cancer or fluid in his lungs.  He is anxious and worried. His urine is negative for UTI.  He was evaluated 2 months ago for the same and was told to follow up with ortho for muscle pain and to take muscle relaxers.   I have reviewed the heart score and perc criteria.  He is low risk for major cardiac event or PE. He does not have pneumonia or pleural effusion. He has a negative troponin and normal EKG. His vitals are normal and  lab work look unremarkable.  I thoroughly discussed the lab and CXR results with him.  I told him to follow up with his primary care provider for muscle aches and to have a stress test done. He agrees with the plan.      Catha GosselinHanna Patel-Mills, PA-C 09/15/14 1627  Linwood DibblesJon Knapp, MD 09/15/14 [phone]

## 2014-09-15 NOTE — ED Notes (Signed)
Pt from home c/o left sided chest pain x 6 days, He also c/o headaches, left arm pain and neck pain for same. He shows me DC papers from a visit on 2/24 in which he had same symptoms, his prescriptions are still attached and he reports he did not follow up with Ortho Gramig.

## 2014-10-09 ENCOUNTER — Telehealth: Payer: Self-pay | Admitting: Internal Medicine

## 2014-10-09 NOTE — Telephone Encounter (Signed)
Received records from Dr Lerry Linerwight Williams for appointment on 10/11/14 with Dr Rennis GoldenHilty.  [facility] for Dr Blanchie DessertHilty's schedule on 10/11/14. lp

## 2014-10-11 ENCOUNTER — Encounter: Payer: Self-pay | Admitting: Internal Medicine

## 2014-10-11 ENCOUNTER — Ambulatory Visit (INDEPENDENT_AMBULATORY_CARE_PROVIDER_SITE_OTHER): Payer: Medicaid Other | Admitting: Internal Medicine

## 2014-10-11 VITALS — BP 134/86 | HR 110 | Ht 64.5 in | Wt 158.8 lb

## 2014-10-11 DIAGNOSIS — E785 Hyperlipidemia, unspecified: Secondary | ICD-10-CM | POA: Diagnosis not present

## 2014-10-11 DIAGNOSIS — I10 Essential (primary) hypertension: Secondary | ICD-10-CM

## 2014-10-11 DIAGNOSIS — R079 Chest pain, unspecified: Secondary | ICD-10-CM | POA: Diagnosis not present

## 2014-10-11 DIAGNOSIS — E7800 Pure hypercholesterolemia, unspecified: Secondary | ICD-10-CM | POA: Insufficient documentation

## 2014-10-11 DIAGNOSIS — Z720 Tobacco use: Secondary | ICD-10-CM

## 2014-10-11 DIAGNOSIS — R0789 Other chest pain: Secondary | ICD-10-CM

## 2014-10-11 NOTE — Patient Instructions (Signed)
Your physician has requested that you have a lexiscan myoview. For further information please visit [email or web address]. Please follow instruction sheet, as given.  Your physician recommends that you schedule a follow-up appointment after your test.  

## 2014-10-11 NOTE — Progress Notes (Signed)
OFFICE NOTE  Chief Complaint:  Chest pain  Primary Care Physician: Pcp Not In System  HPI:  John Mcintyre is a pleasant 44 year old male [place] He says for almost all of his life he had polio and this affected his legs significantly causing weakness in the need to use crutches to walk. He currently works as a Scientist, physiologicalcab driver and is not physically active. Recently he's been having worsening pain in his upper chest shoulders and down both arms. Some the pain is in the back of his neck and even radiates to the right half of his face. While some of those symptoms certainly sound neurologic, he also has some associated fatigue and shortness of breath. He is a smoker and has a history of hypertension and dyslipidemia which is not been well controlled. He was recently started on lovastatin. Triglycerides were noted to be elevated greater than 700 and recently came down to the 300s. His father has a history of heart disease and sounds like he died from heart failure in his 3850s.   PMHx:  Past Medical History  Diagnosis Date  . Polio   . Hypertension   . Hypercholesteremia     Past Surgical History  Procedure Laterality Date  . Hip surgery Left 1987    for polio, not sure what was done  . Knee surgery Left 1987    for polio    FAMHx:  Family History  Problem Relation Age of Onset  . Hypertension Mother   . Heart failure Father     SOCHx:   reports that he has been smoking Cigarettes.  He has been smoking about 2.00 packs per day. He does not have any smokeless tobacco history on file. He reports that he drinks alcohol. He reports that he does not use illicit drugs.  ALLERGIES:  No Known Allergies  ROS: A comprehensive review of systems was negative except for: Constitutional: positive for fatigue Respiratory: positive for dyspnea on exertion Cardiovascular: positive for chest pain Musculoskeletal: positive for Polio, leg weakness and muscle wasting, pain  that shoots down both arms  HOME MEDS: Current Outpatient Prescriptions  Medication Sig Dispense Refill  . hydrochlorothiazide (HYDRODIURIL) 25 MG tablet Take 25 mg by mouth daily.     [name]. ibuprofen (ADVIL,MOTRIN) 600 MG tablet Take 1 tablet (600 mg total) by mouth every 6 (six) hours as needed. 60 tablet 0  . lisinopril (PRINIVIL,ZESTRIL) 40 MG tablet Take 40 mg by mouth daily.      [name]. lovastatin (MEVACOR) 20 MG tablet Take 20 mg by mouth at bedtime.    . methocarbamol (ROBAXIN) 500 MG tablet Take 1 or 2 po QID for soreness in muscles 80 tablet 0  . Omega-3 Fatty Acids (OMEGA 3 PO) Take 400 mg by mouth 3 (three) times daily.    . tadalafil (CIALIS) 10 MG tablet Take 10 mg by mouth daily as needed for erectile dysfunction.     No current facility-administered medications for this visit.    LABS/IMAGING: No results found for this or any previous visit (from the past 48 hour(s)). No results found.  WEIGHTS: Wt Readings from Last 3 Encounters:  10/11/14 158 lb 12.8 oz (72.031 kg)  11/15/13 161 lb (73.029 kg)    VITALS: BP 134/86 mmHg  Pulse 110  Ht 5' 4.5" (1.638 m)  Wt 158 lb 12.8 oz (72.031 kg)  BMI 26.85 kg/m2  EXAM: General appearance: alert and no distress Neck: no carotid bruit, no  JVD and thyroid not enlarged, symmetric, no tenderness/mass/nodules Lungs: clear to auscultation bilaterally Heart: regular rate and rhythm, S1, S2 normal, no murmur, click, rub or gallop Abdomen: soft, non-tender; bowel sounds normal; no masses,  no organomegaly Extremities: No edema, significant muscle wasting of both legs secondary to polio Pulses: 2+ and symmetric Skin: Skin color, texture, turgor normal. No rashes or lesions Neurologic: Mental status: Alert, oriented, thought content appropriate, Abnormal gait secondary to polio, intact strength and reflexes of the upper extremities bilaterally Psych: Mildly anxious  EKG: I personally reviewed EKG from his primary care provider's office  showing normal sinus rhythm at 77 with a Q wave in V1 and V2  ASSESSMENT: 1. Chest pain, moderate risk for cardiac etiology 2. Hypertension 3. Dyslipidemia 4. Tobacco abuse 5. 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   John Mcintyre is describing chest pain however it seems to be more likely to be musculoskeletal. The symptoms radiate down his arms from his neck and upper chest although he also gets some shortness of breath, fatigue and tightness that wakes him up particular in the morning. He does not do a lot of exertion due to his polio. He wonders if some of this could be neurologic, however I'm concerned about a cardiovascular etiology as well. I think we need to exclude that before we consider other diagnoses such as a cervical disc disease or even a post polio syndrome. I recommend a Lexiscan nuclear stress test, due to his inability to walk on a treadmill. If this is negative, he will need aggressive risk factor modification. He's recently been started on a statin which is good. He will need to work on smoking cessation is asked for help with this. I would consider starting him on medication such as Wellbutrin, but we need to do stress testing first and rule out any significant coronary disease.  Thanks for the kind referral. I plan to see him back in a few weeks to discuss those results.  Chrystie NoseKenneth C. Hilty, MD, [facility] FranciscoFACC Attending Cardiologist [facility]  Chrystie NoseKenneth C Hilty 10/11/2014, 3:47 PM

## 2014-10-12 ENCOUNTER — Telehealth (HOSPITAL_COMMUNITY): Payer: Self-pay

## 2014-10-12 NOTE — Telephone Encounter (Signed)
Encounter complete. 

## 2014-10-16 ENCOUNTER — Ambulatory Visit (HOSPITAL_COMMUNITY)
Admission: RE | Admit: 2014-10-16 | Discharge: 2014-10-16 | Disposition: A | Discharge status: Home / Self Care | Payer: Medicaid Other | Source: Ambulatory Visit | Attending: Cardiovascular Disease | Admitting: Cardiovascular Disease

## 2014-10-16 DIAGNOSIS — E785 Hyperlipidemia, unspecified: Secondary | ICD-10-CM | POA: Diagnosis not present

## 2014-10-16 DIAGNOSIS — R079 Chest pain, unspecified: Secondary | ICD-10-CM | POA: Diagnosis not present

## 2014-10-16 DIAGNOSIS — R9439 Abnormal result of other cardiovascular function study: Secondary | ICD-10-CM | POA: Insufficient documentation

## 2014-10-16 DIAGNOSIS — Z8249 Family history of ischemic heart disease and other diseases of the circulatory system: Secondary | ICD-10-CM | POA: Insufficient documentation

## 2014-10-16 DIAGNOSIS — R0602 Shortness of breath: Secondary | ICD-10-CM | POA: Insufficient documentation

## 2014-10-16 LAB — MYOCARDIAL PERFUSION IMAGING
CHL CUP NUCLEAR SDS: 1
CHL CUP NUCLEAR SRS: 4
CHL CUP STRESS STAGE 1 SBP: 125 mmHg
CHL CUP STRESS STAGE 2 GRADE: 0 %
CHL CUP STRESS STAGE 3 GRADE: 0 %
CHL CUP STRESS STAGE 3 SPEED: 0 mph
CHL CUP STRESS STAGE 4 DBP: 74 mmHg
CHL CUP STRESS STAGE 4 HR: 80 {beats}/min
CHL CUP STRESS STAGE 4 SBP: 117 mmHg
CSEPEW: 1 METS
CSEPPHR: 110 {beats}/min
LV sys vol: 40 mL
LVDIAVOL: 82 mL
NUC STRESS TID: 1.15
Nuc Stress EF: 52 %
Percent of predicted max HR: 61 %
Rest HR: 71 {beats}/min
SSS: 3
Stage 1 DBP: 81 mmHg
Stage 1 Grade: 0 %
Stage 1 HR: 76 {beats}/min
Stage 1 Speed: 0 mph
Stage 2 HR: 75 {beats}/min
Stage 2 Speed: 0 mph
Stage 3 HR: 110 {beats}/min
Stage 4 Grade: 0 %
Stage 4 Speed: 0 mph

## 2014-10-16 MED ORDER — TECHNETIUM TC 99M SESTAMIBI GENERIC - CARDIOLITE
31.0000 | Freq: Once | INTRAVENOUS | Status: AC | PRN
  Administered 2014-10-16: 31 via INTRAVENOUS

## 2014-10-16 MED ORDER — AMINOPHYLLINE 25 MG/ML IV SOLN
75.0000 mg | Freq: Once | INTRAVENOUS | Status: AC
  Administered 2014-10-16: 75 mg via INTRAVENOUS

## 2014-10-16 MED ORDER — TECHNETIUM TC 99M SESTAMIBI GENERIC - CARDIOLITE
11.0000 | Freq: Once | INTRAVENOUS | Status: AC | PRN
  Administered 2014-10-16: 11 via INTRAVENOUS

## 2014-10-16 MED ORDER — REGADENOSON 0.4 MG/5ML IV SOLN
0.4000 mg | Freq: Once | INTRAVENOUS | Status: AC
  Administered 2014-10-16: 0.4 mg via INTRAVENOUS

## 2014-11-29 ENCOUNTER — Ambulatory Visit (INDEPENDENT_AMBULATORY_CARE_PROVIDER_SITE_OTHER): Payer: Medicaid Other | Admitting: Internal Medicine

## 2014-11-29 ENCOUNTER — Encounter: Payer: Self-pay | Admitting: Internal Medicine

## 2014-11-29 VITALS — BP 134/83 | HR 110 | Ht 65.0 in | Wt 158.0 lb

## 2014-11-29 DIAGNOSIS — Z720 Tobacco use: Secondary | ICD-10-CM

## 2014-11-29 DIAGNOSIS — I10 Essential (primary) hypertension: Secondary | ICD-10-CM | POA: Diagnosis not present

## 2014-11-29 DIAGNOSIS — R0789 Other chest pain: Secondary | ICD-10-CM

## 2014-11-29 DIAGNOSIS — A809 Acute poliomyelitis, unspecified: Secondary | ICD-10-CM | POA: Diagnosis not present

## 2014-11-29 MED ORDER — NICOTINE 21 MG/24HR TD PT24: 21.0000 mg | MEDICATED_PATCH | Freq: Every day | TRANSDERMAL | Status: DC

## 2014-11-29 MED ORDER — LOVASTATIN 20 MG PO TABS: 20.0000 mg | ORAL_TABLET | Freq: Every day | ORAL | Status: DC

## 2014-11-29 NOTE — Progress Notes (Signed)
OFFICE NOTE  Chief Complaint:  Follow-up stress test  Primary Care Physician: Pcp Not In System  HPI:  John Mcintyre is a pleasant 44 year old male [place] He says for almost all of his life he had polio and this affected his legs significantly causing weakness in the need to use crutches to walk. He currently works as a Scientist, physiologicalcab driver and is not physically active. Recently he's been having worsening pain in his upper chest shoulders and down both arms. Some the pain is in the back of his neck and even radiates to the right half of his face. While some of those symptoms certainly sound neurologic, he also has some associated fatigue and shortness of breath. He is a smoker and has a history of hypertension and dyslipidemia which is not been well controlled. He was recently started on lovastatin. Triglycerides were noted to be elevated greater than 700 and recently came down to the 300s. His father has a history of heart disease and sounds like he died from heart failure in his 5350s.   I had the pleasure seeing John Mcintyre back in the office today. He underwent a nuclear stress test which was interpreted as low risk. There was a very small distal anteroapical fixed defect which was suggestive of possible scar. EF was low normal at 52%. No reversible ischemia was seen. He reports no further significant symptoms. Most of his discomfort is in the upper chest and shoulders and is worse when he's driving his taxi. He's recently has coronary risk factors, but his symptoms are not concerning for ischemia. He is interested in smoking cessation. He currently is on good medical therapy for coronary disease including a statin, lisinopril and HCTZ. Pressure is well controlled. He does not take aspirin.  PMHx:  Past Medical History  Diagnosis Date  . Polio   . Hypertension   . Hypercholesteremia     Past Surgical History  Procedure Laterality Date  . Hip surgery Left 1987    for  polio, not sure what was done  . Knee surgery Left 1987    for polio    FAMHx:  Family History  Problem Relation Age of Onset  . Hypertension Mother   . Heart failure Father     SOCHx:   reports that he has been smoking Cigarettes.  He has been smoking about 2.00 packs per day. He does not have any smokeless tobacco history on file. He reports that he drinks alcohol. He reports that he does not use illicit drugs.  ALLERGIES:  No Known Allergies  ROS: A comprehensive review of systems was negative except for: Constitutional: positive for fatigue Respiratory: positive for dyspnea on exertion Cardiovascular: positive for chest pain Musculoskeletal: positive for Polio, leg weakness and muscle wasting, pain that shoots down both arms  HOME MEDS: Current Outpatient Prescriptions  Medication Sig Dispense Refill  . aspirin EC 81 MG tablet Take 81 mg by mouth daily.    . hydrochlorothiazide (HYDRODIURIL) 25 MG tablet Take 25 mg by mouth daily.     [name]. ibuprofen (ADVIL,MOTRIN) 600 MG tablet Take 1 tablet (600 mg total) by mouth every 6 (six) hours as needed. 60 tablet 0  . lisinopril (PRINIVIL,ZESTRIL) 40 MG tablet Take 40 mg by mouth daily.      [name]. lovastatin (MEVACOR) 20 MG tablet Take 1 tablet (20 mg total) by mouth at bedtime. 30 tablet 6  . methocarbamol (ROBAXIN) 500 MG tablet Take 1 or 2  po QID for soreness in muscles 80 tablet 0  . Omega-3 Fatty Acids (OMEGA 3 PO) Take 400 mg by mouth 3 (three) times daily.    . tadalafil (CIALIS) 10 MG tablet Take 10 mg by mouth daily as needed for erectile dysfunction.    . nicotine (EQ NICOTINE) 21 mg/24hr patch Place 1 patch (21 mg total) onto the skin daily. 28 patch 0   No current facility-administered medications for this visit.    LABS/IMAGING: No results found for this or any previous visit (from the past 48 hour(s)). No results found.  WEIGHTS: Wt Readings from Last 3 Encounters:  11/29/14 158 lb (71.668 kg)  10/16/14 158 lb  (71.668 kg)  10/11/14 158 lb 12.8 oz (72.031 kg)    VITALS: BP 134/83 mmHg  Pulse 110  Ht  (1.651 m)  Wt 158 lb (71.668 kg)  BMI 26.29 kg/m2  EXAM: Deferred  EKG: Deferred  ASSESSMENT: 1. Chest pain- -low risk stress test with a small distal anteroapical fixed defect 2. Hypertension 3. Dyslipidemia 4. Tobacco abuse 5. 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   Mr. Cueto had a low risk stress test with possible small distal anteroapical scar. EF was low normal at 52%. I do not feel he is having active angina. He is on good medical therapy for coronary disease. I do think he benefit from taking low-dose aspirin 81 mg daily and I recommended that. He is also interested in smoking cessation. We will go ahead and prescribe 21 mg nicotine patches today for 28 day supply. He's encouraged to quit and use these patches. He will then follow-up with his primary care provider who can hopefully step him down.  I'm happy to see him back on an as-needed basis but would continue his current medications. If he were to have any worsening symptoms with exertion, which could be concerning for angina, I'm happy to see him for that.  Chrystie Nose, MD, [facility] Attending Cardiologist [facility]  Chrystie Nose 11/29/2014, 3:57 PM

## 2014-11-29 NOTE — Patient Instructions (Signed)
Your physician has recommended you make the following change in your medication...  1. START aspirin 2. START nicotine patch - one patch daily  >> please obtain future refills from primary care doctor  You can follow up as needed with Dr. Rennis GoldenHilty

## 2015-01-01 ENCOUNTER — Encounter (HOSPITAL_COMMUNITY): Payer: Self-pay | Admitting: *Deleted

## 2015-01-01 ENCOUNTER — Emergency Department (HOSPITAL_COMMUNITY)
Admission: EM | Admit: 2015-01-01 | Discharge: 2015-01-02 | Disposition: A | Discharge status: Home / Self Care | Payer: Medicaid Other | Attending: Emergency Medicine | Admitting: Emergency Medicine

## 2015-01-01 DIAGNOSIS — I10 Essential (primary) hypertension: Secondary | ICD-10-CM | POA: Insufficient documentation

## 2015-01-01 DIAGNOSIS — Z79899 Other long term (current) drug therapy: Secondary | ICD-10-CM | POA: Insufficient documentation

## 2015-01-01 DIAGNOSIS — Z720 Tobacco use: Secondary | ICD-10-CM | POA: Diagnosis not present

## 2015-01-01 DIAGNOSIS — E780 Pure hypercholesterolemia: Secondary | ICD-10-CM | POA: Insufficient documentation

## 2015-01-01 DIAGNOSIS — H9221 Otorrhagia, right ear: Secondary | ICD-10-CM | POA: Diagnosis present

## 2015-01-01 DIAGNOSIS — R51 Headache: Secondary | ICD-10-CM | POA: Insufficient documentation

## 2015-01-01 DIAGNOSIS — H938X1 Other specified disorders of right ear: Secondary | ICD-10-CM | POA: Insufficient documentation

## 2015-01-01 DIAGNOSIS — H61891 Other specified disorders of right external ear: Secondary | ICD-10-CM

## 2015-01-01 DIAGNOSIS — Z7982 Long term (current) use of aspirin: Secondary | ICD-10-CM | POA: Diagnosis not present

## 2015-01-01 DIAGNOSIS — Z8619 Personal history of other infectious and parasitic diseases: Secondary | ICD-10-CM | POA: Insufficient documentation

## 2015-01-01 NOTE — ED Provider Notes (Signed)
CSN: [id number]   Arrival date & time 01/01/15 2311  History  This chart was scribed for non-physician practitioner, [name] PA-C , working with [name], MD by [name], ED Scribe. This patient was seen in room WTR6/WTR6 and the patient's care was started at 11:42 PM.  Chief Complaint  Patient presents with  . Ear Drainage    HPI The history is provided by the patient. No language interpreter was used.   John Mcintyre is a 44 y.o. male who presents to the Emergency Department complaining of bloody drainage from the right ear with onset today while cleaning it. Pt notes that he has "bumps" in both of his ears and [place].  Associated symptoms include mild headache and mild right ear pain. He denies hearing loss. Pt takes 81 mg of aspirin daily.   Past Medical History  Diagnosis Date  . Polio   . Hypertension   . Hypercholesteremia     Past Surgical History  Procedure Laterality Date  . Hip surgery Left 1987    for polio, not sure what was done  . Knee surgery Left 1987    for polio    Family History  Problem Relation Age of Onset  . Hypertension Mother   . Heart failure Father     History  Substance Use Topics  . Smoking status: Current Every Day Smoker -- 2.00 packs/day    Types: Cigarettes  . Smokeless tobacco: Not on file  . Alcohol Use: Yes     Comment: occ     Review of Systems  HENT: Positive for ear discharge and ear pain.   Neurological: Positive for headaches.    Home Medications   Prior to Admission medications   Medication Sig Start Date End Date Taking? Authorizing Provider  aspirin EC 81 MG tablet Take 81 mg by mouth daily.    Historical Provider, MD  hydrochlorothiazide (HYDRODIURIL) 25 MG tablet Take 25 mg by mouth daily.     Historical Provider, MD  ibuprofen (ADVIL,MOTRIN) 600 MG tablet Take 1 tablet (600 mg total) by mouth every 6 (six) hours as needed. 07/25/14   Devoria Albe, MD   lisinopril (PRINIVIL,ZESTRIL) 40 MG tablet Take 40 mg by mouth daily.      Historical Provider, MD  lovastatin (MEVACOR) 20 MG tablet Take 1 tablet (20 mg total) by mouth at bedtime. 11/29/14   Chrystie Nose, MD  methocarbamol (ROBAXIN) 500 MG tablet Take 1 or 2 po QID for soreness in muscles 07/25/14   Devoria Albe, MD  nicotine (EQ NICOTINE) 21 mg/24hr patch Place 1 patch (21 mg total) onto the skin daily. 11/29/14   Chrystie Nose, MD  Omega-3 Fatty Acids (OMEGA 3 PO) Take 400 mg by mouth 3 (three) times daily.    Historical Provider, MD  tadalafil (CIALIS) 10 MG tablet Take 10 mg by mouth daily as needed for erectile dysfunction.    Historical Provider, MD    Allergies  Review of patient's allergies indicates no known allergies.  Triage Vitals: BP 122/66 mmHg  Pulse 105  Temp(Src) 99 F (37.2 C) (Oral)  Resp 18  Wt 160 lb (72.576 kg)  SpO2 98%  Physical Exam  Constitutional: He is oriented to person, place, and time. He appears well-developed and well-nourished. No distress.  HENT:  Head: Normocephalic and atraumatic.  There is mild active bleeding in the right external canal from a small raised bump along the floor  of the canal. TM is clear and without perforation or blood in the middle ear. No purulence.   Eyes: Conjunctivae and EOM are normal.  Neck: Neck supple. No tracheal deviation present.  Cardiovascular: Normal rate.   Pulmonary/Chest: Effort normal. No respiratory distress.  Musculoskeletal: Normal range of motion.  Neurological: He is alert and oriented to person, place, and time.  Skin: Skin is warm and dry.  Psychiatric: He has a normal mood and affect. His behavior is normal.  Nursing note and vitals reviewed.   ED Course  Procedures   DIAGNOSTIC STUDIES: Oxygen Saturation is 98% on RA, normal by my interpretation.    COORDINATION OF CARE: 11:46 PM Discussed treatment plan with pt at bedside and pt agreed to plan.  Labs Review- Labs Reviewed - No data to  display  Imaging Review No results found.  EKG Interpretation None      MDM   Final diagnoses:  None  1. Right ear canal wound  Suspect injury while cleaning ears with q-tip. Patient educated on limitations of external canal cleaning. Refer to Dr. Annalee Genta if bleeding persists or he feels he has further 'bumps' or lesion in the canal.   I personally performed the services described in this documentation, which was scribed in my presence. The recorded information has been reviewed and is accurate.       [name], PA-C 01/02/15 0013  [name], MD 01/02/15 [phone]

## 2015-01-01 NOTE — ED Notes (Signed)
Pt states that she was cleaning his ears and has some blood drainage from rt ear; pt denies pain or difficulty hearing

## 2015-01-02 MED ORDER — ACETAMINOPHEN 500 MG PO TABS: 1000.0000 mg | ORAL_TABLET | Freq: Once | ORAL | Status: DC

## 2015-01-02 NOTE — ED Notes (Signed)
Pt reports his wife was cleaning his R ear when she noticed bloody drainage.  Pt denies pain or hearing loss at this time

## 2015-01-02 NOTE — Discharge Instructions (Signed)
FOLLOW UP WITH DR. Annalee Genta IF EAR BLEEDING CONTINUES OR YOU HAVE FURTHER 'BUMPS' OR SORES IN THE EAR CANAL.

## 2015-04-10 ENCOUNTER — Emergency Department (HOSPITAL_COMMUNITY)
Admission: EM | Admit: 2015-04-10 | Discharge: 2015-04-10 | Disposition: A | Discharge status: Home / Self Care | Payer: Medicaid Other | Attending: Emergency Medicine | Admitting: Emergency Medicine

## 2015-04-10 ENCOUNTER — Encounter (HOSPITAL_COMMUNITY): Payer: Self-pay | Admitting: Emergency Medicine

## 2015-04-10 ENCOUNTER — Emergency Department (HOSPITAL_COMMUNITY): Payer: Medicaid Other

## 2015-04-10 DIAGNOSIS — M25512 Pain in left shoulder: Secondary | ICD-10-CM | POA: Insufficient documentation

## 2015-04-10 DIAGNOSIS — Z720 Tobacco use: Secondary | ICD-10-CM | POA: Diagnosis not present

## 2015-04-10 DIAGNOSIS — R079 Chest pain, unspecified: Secondary | ICD-10-CM | POA: Diagnosis not present

## 2015-04-10 DIAGNOSIS — Z791 Long term (current) use of non-steroidal anti-inflammatories (NSAID): Secondary | ICD-10-CM | POA: Insufficient documentation

## 2015-04-10 DIAGNOSIS — E7800 Pure hypercholesterolemia, unspecified: Secondary | ICD-10-CM | POA: Insufficient documentation

## 2015-04-10 DIAGNOSIS — Z79899 Other long term (current) drug therapy: Secondary | ICD-10-CM | POA: Insufficient documentation

## 2015-04-10 DIAGNOSIS — I10 Essential (primary) hypertension: Secondary | ICD-10-CM | POA: Diagnosis not present

## 2015-04-10 DIAGNOSIS — Z7982 Long term (current) use of aspirin: Secondary | ICD-10-CM | POA: Diagnosis not present

## 2015-04-10 DIAGNOSIS — Z8612 Personal history of poliomyelitis: Secondary | ICD-10-CM | POA: Diagnosis not present

## 2015-04-10 LAB — BASIC METABOLIC PANEL
ANION GAP: 9 (ref 5–15)
BUN: 13 mg/dL (ref 6–20)
CO2: 27 mmol/L (ref 22–32)
Calcium: 9.5 mg/dL (ref 8.9–10.3)
Chloride: 98 mmol/L — ABNORMAL LOW (ref 101–111)
Creatinine, Ser: 0.62 mg/dL (ref 0.61–1.24)
Glucose, Bld: 110 mg/dL — ABNORMAL HIGH (ref 65–99)
Potassium: 3.4 mmol/L — ABNORMAL LOW (ref 3.5–5.1)
Sodium: 134 mmol/L — ABNORMAL LOW (ref 135–145)

## 2015-04-10 LAB — CBC
HCT: 42.3 % (ref 39.0–52.0)
HEMOGLOBIN: 14.7 g/dL (ref 13.0–17.0)
MCH: 30.7 pg (ref 26.0–34.0)
MCHC: 34.8 g/dL (ref 30.0–36.0)
MCV: 88.3 fL (ref 78.0–100.0)
PLATELETS: 223 10*3/uL (ref 150–400)
RBC: 4.79 MIL/uL (ref 4.22–5.81)
RDW: 13.3 % (ref 11.5–15.5)
WBC: 11.7 10*3/uL — ABNORMAL HIGH (ref 4.0–10.5)

## 2015-04-10 LAB — I-STAT TROPONIN, ED: Troponin i, poc: 0 ng/mL (ref 0.00–0.08)

## 2015-04-10 NOTE — ED Notes (Signed)
Pt states left sided chest pain on-going x 3 months intermittently with accompanying left arm/shoulder pain. Denies SOB, N/V/D, fever/chills.

## 2015-04-10 NOTE — ED Provider Notes (Signed)
CSN: [id number]     Arrival date & time 04/10/15  1405 History   First MD Initiated Contact with Patient 04/10/15 1629     Chief Complaint  Patient presents with  . Chest Pain     (Consider location/radiation/quality/duration/timing/severity/associated sxs/prior Treatment) HPI Comments: 44 y.o. Male with history of Polio, HTN, hypercholesterolemia presents for chest pain.  The patient reports that for months he has had intermittent pain in his chest and left arm.  No associated shortness of breath.  The shoulder pain is made worse by the crutches he uses secondary to his polio.  The chest pain is not related to exertion but appears to come on randomly.  He says that it is the same pain he was having when he was seen by cardiology in May 2016 and underwent a stress MPI without acute finding but with small distal fixed lesion.  Patient continues to smoke cigarettes although he has been instructed to quit.  No recent travel, fever, chills, nausea, vomiting, leg pain or swelling.   Past Medical History  Diagnosis Date  . Polio   . Hypertension   . Hypercholesteremia    Past Surgical History  Procedure Laterality Date  . Hip surgery Left 1987    for polio, not sure what was done  . Knee surgery Left 1987    for polio   Family History  Problem Relation Age of Onset  . Hypertension Mother   . Heart failure Father    Social History  Substance Use Topics  . Smoking status: Current Every Day Smoker -- 2.00 packs/day    Types: Cigarettes  . Smokeless tobacco: None  . Alcohol Use: Yes     Comment: occ    Review of Systems  Constitutional: Negative for fever, chills, appetite change and fatigue.  HENT: Negative for congestion, postnasal drip, rhinorrhea and voice change.   Respiratory: Negative for cough, chest tightness and shortness of breath.   Cardiovascular: Positive for chest pain. Negative for palpitations.  Gastrointestinal: Negative for nausea, abdominal pain, diarrhea and  constipation.  Genitourinary: Negative for dysuria and hematuria.  Musculoskeletal: Positive for arthralgias (left shoulder). Negative for myalgias, back pain and neck pain.  Skin: Negative for rash.  Neurological: Negative for dizziness, syncope, weakness and headaches.  Hematological: Does not bruise/bleed easily.      Allergies  Review of patient's allergies indicates no known allergies.  Home Medications   Prior to Admission medications   Medication Sig Start Date End Date Taking? Authorizing Provider  aspirin EC 81 MG tablet Take 81 mg by mouth daily.   Yes Historical Provider, MD  hydrochlorothiazide (HYDRODIURIL) 25 MG tablet Take 25 mg by mouth daily.    Yes Historical Provider, MD  lisinopril (PRINIVIL,ZESTRIL) 40 MG tablet Take 40 mg by mouth daily.     Yes Historical Provider, MD  naproxen (NAPROSYN) 500 MG tablet Take 500 mg by mouth 2 (two) times daily with a meal. 03/20/15  Yes Historical Provider, MD  Omega-3 Fatty Acids (OMEGA 3 PO) Take 400 mg by mouth 3 (three) times daily.   Yes Historical Provider, MD  tadalafil (CIALIS) 10 MG tablet Take 10 mg by mouth daily as needed for erectile dysfunction.   Yes Historical Provider, MD  ibuprofen (ADVIL,MOTRIN) 600 MG tablet Take 1 tablet (600 mg total) by mouth every 6 (six) hours as needed. Patient not taking: Reported on 04/10/2015 07/25/14   Devoria Albe, MD  lovastatin (MEVACOR) 20 MG tablet Take 1 tablet (20 mg total)  by mouth at bedtime. Patient not taking: Reported on 04/10/2015 11/29/14   Chrystie Nose, MD  methocarbamol (ROBAXIN) 500 MG tablet Take 1 or 2 po QID for soreness in muscles Patient not taking: Reported on 04/10/2015 07/25/14   Devoria Albe, MD  nicotine (EQ NICOTINE) 21 mg/24hr patch Place 1 patch (21 mg total) onto the skin daily. Patient not taking: Reported on 04/10/2015 11/29/14   Chrystie Nose, MD   BP 125/77 mmHg  Pulse 87  Temp(Src) 98 F (36.7 C) (Oral)  Resp 18  SpO2 98% Physical Exam   Constitutional: He is oriented to person, place, and time. He appears well-developed and well-nourished. No distress.  HENT:  Head: Normocephalic and atraumatic.  Right Ear: External ear normal.  Left Ear: External ear normal.  Mouth/Throat: Oropharynx is clear and moist. No oropharyngeal exudate.  Eyes: EOM are normal. Pupils are equal, round, and reactive to light.  Neck: Normal range of motion. Neck supple.  Cardiovascular: Normal rate, regular rhythm, normal heart sounds and intact distal pulses.   No murmur heard. Pulmonary/Chest: Effort normal. No respiratory distress. He has no wheezes. He has no rales.  Abdominal: Soft. He exhibits no distension. There is no tenderness.  Musculoskeletal: He exhibits no edema.       Left shoulder: He exhibits pain (mild pain with range of motion especially of abduction of the joint). He exhibits normal range of motion, no tenderness and no spasm.  Atrophy of the musculature of the bilateral lower extremities  Neurological: He is alert and oriented to person, place, and time.  Skin: Skin is warm and dry. No rash noted. He is not diaphoretic.  Vitals reviewed.   ED Course  Procedures (including critical care time) Labs Review Labs Reviewed  BASIC METABOLIC PANEL - Abnormal; Notable for the following:    Sodium 134 (*)    Potassium 3.4 (*)    Chloride 98 (*)    Glucose, Bld 110 (*)    All other components within normal limits  CBC - Abnormal; Notable for the following:    WBC 11.7 (*)    All other components within normal limits  I-STAT TROPOININ, ED    Imaging Review Dg Chest 2 View  04/10/2015  CLINICAL DATA:  44 year old male with chest pain and left-sided neck pain for the past 2-3 days. EXAM: CHEST  2 VIEW COMPARISON:  Chest x-ray 09/15/2014. FINDINGS: Lung volumes are low. No consolidative airspace disease. No pleural effusions. No pneumothorax. No pulmonary nodule or mass noted. Pulmonary vasculature and the cardiomediastinal  silhouette are within normal limits. IMPRESSION: Low lung volumes without radiographic evidence of acute cardiopulmonary disease. Electronically Signed   By: Trudie Reed M.D.   On: 04/10/2015 15:14   I have personally reviewed and evaluated these images and lab results as part of my medical decision-making.   EKG Interpretation   Date/Time:  Wednesday April 10 2015 14:15:41 EST Ventricular Rate:  97 PR Interval:  154 QRS Duration: 85 QT Interval:  337 QTC Calculation: 428 R Axis:   73 Text Interpretation:  Sinus rhythm Probable left atrial enlargement  Anteroseptal infarct, old No significant change since last tracing  Confirmed by Tyrone Apple ([id number]) on 04/10/2015 5:03:28 PM      MDM  Patient seen and evaluated in stable condition.  Previous cardiac studies reviewed.  EKG unremarkable.  Troponin and other blood work unremarkable.  Patient without active chest pain.  Currently taking ASA daily.  Heart Score 2 - low risk.  Discussed with patient need to follow up with his cardiologist, lifestyle adjustments, and strict return precautions.  Patient discharged home in stable condition with all questions answered. Final diagnoses:  Chest pain, unspecified chest pain type    1. Chest pain    Leta BaptistEmily Roe Jabril Pursell, MD 04/11/15 1430

## 2015-04-10 NOTE — Discharge Instructions (Signed)
Nonspecific Chest Pain   Your heart enzyme was normal today and your EKG looks the same as it did before.  You need to follow up with your cardiologist (heart doctor) and with your primary care physician as soon as possible.  Chest pain can be caused by many different conditions. There is always a chance that your pain could be related to something serious, such as a heart attack or a blood clot in your lungs. Chest pain can also be caused by conditions that are not life-threatening. If you have chest pain, it is very important to follow up with your health care provider. CAUSES  Chest pain can be caused by:  Heartburn.  Pneumonia or bronchitis.  Anxiety or stress.  Inflammation around your heart (pericarditis) or lung (pleuritis or pleurisy).  A blood clot in your lung.  A collapsed lung (pneumothorax). It can develop suddenly on its own (spontaneous pneumothorax) or from trauma to the chest.  Shingles infection (varicella-zoster virus).  Heart attack.  Damage to the bones, muscles, and cartilage that make up your chest wall. This can include:  Bruised bones due to injury.  Strained muscles or cartilage due to frequent or repeated coughing or overwork.  Fracture to one or more ribs.  Sore cartilage due to inflammation (costochondritis). RISK FACTORS  Risk factors for chest pain may include:  Activities that increase your risk for trauma or injury to your chest.  Respiratory infections or conditions that cause frequent coughing.  Medical conditions or overeating that can cause heartburn.  Heart disease or family history of heart disease.  Conditions or health behaviors that increase your risk of developing a blood clot.  Having had chicken pox (varicella zoster). SIGNS AND SYMPTOMS Chest pain can feel like:  Burning or tingling on the surface of your chest or deep in your chest.  Crushing, pressure, aching, or squeezing pain.  Dull or sharp pain that is worse  when you move, cough, or take a deep breath.  Pain that is also felt in your back, neck, shoulder, or arm, or pain that spreads to any of these areas. Your chest pain may come and go, or it may stay constant. DIAGNOSIS Lab tests or other studies may be needed to find the cause of your pain. Your health care provider may have you take a test called an ambulatory ECG (electrocardiogram). An ECG records your heartbeat patterns at the time the test is performed. You may also have other tests, such as:  Transthoracic echocardiogram (TTE). During echocardiography, sound waves are used to create a picture of all of the heart structures and to look at how blood flows through your heart.  Transesophageal echocardiogram (TEE).This is a more advanced imaging test that obtains images from inside your body. It allows your health care provider to see your heart in finer detail.  Cardiac monitoring. This allows your health care provider to monitor your heart rate and rhythm in real time.  Holter monitor. This is a portable device that records your heartbeat and can help to diagnose abnormal heartbeats. It allows your health care provider to track your heart activity for several days, if needed.  Stress tests. These can be done through exercise or by taking medicine that makes your heart beat more quickly.  Blood tests.  Imaging tests. TREATMENT  Your treatment depends on what is causing your chest pain. Treatment may include:  Medicines. These may include:  Acid blockers for heartburn.  Anti-inflammatory medicine.  Pain medicine for inflammatory conditions.  Antibiotic medicine, if an infection is present.  Medicines to dissolve blood clots.  Medicines to treat coronary artery disease.  Supportive care for conditions that do not require medicines. This may include:  Resting.  Applying heat or cold packs to injured areas.  Limiting activities until pain decreases. HOME CARE  INSTRUCTIONS  If you were prescribed an antibiotic medicine, finish it all even if you start to feel better.  Avoid any activities that bring on chest pain.  Do not use any tobacco products, including cigarettes, chewing tobacco, or electronic cigarettes. If you need help quitting, ask your health care provider.  Do not drink alcohol.  Take medicines only as directed by your health care provider.  Keep all follow-up visits as directed by your health care provider. This is important. This includes any further testing if your chest pain does not go away.  If heartburn is the cause for your chest pain, you may be told to keep your head raised (elevated) while sleeping. This reduces the chance that acid will go from your stomach into your esophagus.  Make lifestyle changes as directed by your health care provider. These may include:  Getting regular exercise. Ask your health care provider to suggest some activities that are safe for you.  Eating a heart-healthy diet. A registered dietitian can help you to learn healthy eating options.  Maintaining a healthy weight.  Managing diabetes, if necessary.  Reducing stress. SEEK MEDICAL CARE IF:  Your chest pain does not go away after treatment.  You have a rash with blisters on your chest.  You have a fever. SEEK IMMEDIATE MEDICAL CARE IF:   Your chest pain is worse.  You have an increasing cough, or you cough up blood.  You have severe abdominal pain.  You have severe weakness.  You faint.  You have chills.  You have sudden, unexplained chest discomfort.  You have sudden, unexplained discomfort in your arms, back, neck, or jaw.  You have shortness of breath at any time.  You suddenly start to sweat, or your skin gets clammy.  You feel nauseous or you vomit.  You suddenly feel light-headed or dizzy.  Your heart begins to beat quickly, or it feels like it is skipping beats. These symptoms may represent a serious  problem that is an emergency. Do not wait to see if the symptoms will go away. Get medical help right away. Call your local emergency services (911 in the [place] Do not drive yourself to the hospital.   This information is not intended to replace advice given to you by your health care provider. Make sure you discuss any questions you have with your health care provider.   Document Released: 02/25/2005 Document Revised: 06/08/2014 Document Reviewed: 12/22/2013 Elsevier Interactive Patient Education [employer]

## 2015-05-08 ENCOUNTER — Emergency Department (HOSPITAL_COMMUNITY): Payer: Medicaid Other

## 2015-05-08 ENCOUNTER — Emergency Department (HOSPITAL_COMMUNITY)
Admission: EM | Admit: 2015-05-08 | Discharge: 2015-05-08 | Disposition: A | Discharge status: Home / Self Care | Payer: Medicaid Other | Attending: Emergency Medicine | Admitting: Emergency Medicine

## 2015-05-08 ENCOUNTER — Encounter (HOSPITAL_COMMUNITY): Payer: Self-pay

## 2015-05-08 DIAGNOSIS — E7800 Pure hypercholesterolemia, unspecified: Secondary | ICD-10-CM | POA: Insufficient documentation

## 2015-05-08 DIAGNOSIS — Z79899 Other long term (current) drug therapy: Secondary | ICD-10-CM | POA: Insufficient documentation

## 2015-05-08 DIAGNOSIS — H43392 Other vitreous opacities, left eye: Secondary | ICD-10-CM | POA: Insufficient documentation

## 2015-05-08 DIAGNOSIS — Z791 Long term (current) use of non-steroidal anti-inflammatories (NSAID): Secondary | ICD-10-CM | POA: Diagnosis not present

## 2015-05-08 DIAGNOSIS — I10 Essential (primary) hypertension: Secondary | ICD-10-CM | POA: Insufficient documentation

## 2015-05-08 DIAGNOSIS — R51 Headache: Secondary | ICD-10-CM | POA: Diagnosis present

## 2015-05-08 DIAGNOSIS — Z7982 Long term (current) use of aspirin: Secondary | ICD-10-CM | POA: Diagnosis not present

## 2015-05-08 DIAGNOSIS — Z8612 Personal history of poliomyelitis: Secondary | ICD-10-CM | POA: Diagnosis not present

## 2015-05-08 DIAGNOSIS — F17210 Nicotine dependence, cigarettes, uncomplicated: Secondary | ICD-10-CM | POA: Diagnosis not present

## 2015-05-08 DIAGNOSIS — R519 Headache, unspecified: Secondary | ICD-10-CM

## 2015-05-08 MED ORDER — AMOXICILLIN 500 MG PO CAPS: 500.0000 mg | ORAL_CAPSULE | Freq: Three times a day (TID) | ORAL | Status: DC

## 2015-05-08 MED ORDER — TETRACAINE HCL 0.5 % OP SOLN
2.0000 [drp] | Freq: Once | OPHTHALMIC | Status: AC
  Administered 2015-05-08: 2 [drp] via OPHTHALMIC
  Filled 2015-05-08: qty 4

## 2015-05-08 MED ORDER — FLUORESCEIN SODIUM 1 MG OP STRP
1.0000 | ORAL_STRIP | Freq: Once | OPHTHALMIC | Status: AC
  Administered 2015-05-08: 1 via OPHTHALMIC
  Filled 2015-05-08: qty 1

## 2015-05-08 NOTE — Discharge Instructions (Signed)
Sinus Headache A sinus headache occurs when the paranasal sinuses become clogged or swollen. Paranasal sinuses are air pockets within the bones of the face. Sinus headaches can range from mild to severe. CAUSES A sinus headache can result from various conditions that affect the sinuses, such as:  Colds.  Sinus infections.  Allergies. SYMPTOMS The main symptom of this condition is a headache that may feel like pain or pressure in the face, forehead, ears, or upper teeth. People who have a sinus headache often have other symptoms, such as:  Congested or runny nose.  Fever.  Inability to smell. Weather changes can make symptoms worse. DIAGNOSIS This condition may be diagnosed based on:  A physical exam and medical history.  Imaging tests, such as a CT scan and MRI, to check for problems with the sinuses.  A specialist may look into the sinuses with a tool that has a camera (endoscopy). TREATMENT Treatment for this condition depends on the cause.  Sinus pain that is caused by a sinus infection may be treated with antibiotic medicine.  Sinus pain that is caused by allergies may be helped by allergy medicines (antihistamines) and medicated nasal sprays.  Sinus pain that is caused by congestion may be helped by flushing the nose and sinuses with saline solution. HOME CARE INSTRUCTIONS  Take medicines only as directed by your health care provider.  If you were prescribed an antibiotic medicine, finish all of it even if you start to feel better.  If you have congestion, use a nasal spray to help reduce pressure.  If directed, apply a warm, moist washcloth to your face to help relieve pain. SEEK MEDICAL CARE IF:  You have headaches more than one time each week.  You have sensitivity to light or sound.  You have a fever.  You feel sick to your stomach (nauseous) or you throw up (vomit).  Your headaches do not get better with treatment. Many people think that they have a  sinus headache when they actually have migraines or tension headaches. SEEK IMMEDIATE MEDICAL CARE IF:  You have vision problems.  You have sudden, severe pain in your face or head.  You have a seizure.  You are confused.  You have a stiff neck.   This information is not intended to replace advice given to you by your health care provider. Make sure you discuss any questions you have with your health care provider.   Document Released: 06/25/2004 Document Revised: 10/02/2014 Document Reviewed: 05/14/2014 Elsevier Interactive Patient Education 2016 [employer]  
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
## 2015-05-08 NOTE — ED Notes (Signed)
Patient was alert, oriented and stable upon discharge. RN went over AVS and patient had no further questions.  

## 2015-07-06 ENCOUNTER — Encounter (HOSPITAL_COMMUNITY): Payer: Self-pay | Admitting: Emergency Medicine

## 2015-07-06 ENCOUNTER — Emergency Department (HOSPITAL_COMMUNITY): Payer: Medicaid Other

## 2015-07-06 ENCOUNTER — Emergency Department (HOSPITAL_COMMUNITY)
Admission: EM | Admit: 2015-07-06 | Discharge: 2015-07-06 | Disposition: A | Discharge status: Home / Self Care | Payer: Medicaid Other | Attending: Emergency Medicine | Admitting: Emergency Medicine

## 2015-07-06 DIAGNOSIS — M79641 Pain in right hand: Secondary | ICD-10-CM | POA: Diagnosis not present

## 2015-07-06 DIAGNOSIS — Z7952 Long term (current) use of systemic steroids: Secondary | ICD-10-CM | POA: Diagnosis not present

## 2015-07-06 DIAGNOSIS — Z8619 Personal history of other infectious and parasitic diseases: Secondary | ICD-10-CM | POA: Diagnosis not present

## 2015-07-06 DIAGNOSIS — Z791 Long term (current) use of non-steroidal anti-inflammatories (NSAID): Secondary | ICD-10-CM | POA: Insufficient documentation

## 2015-07-06 DIAGNOSIS — Z79899 Other long term (current) drug therapy: Secondary | ICD-10-CM | POA: Insufficient documentation

## 2015-07-06 DIAGNOSIS — F17210 Nicotine dependence, cigarettes, uncomplicated: Secondary | ICD-10-CM | POA: Diagnosis not present

## 2015-07-06 DIAGNOSIS — E7800 Pure hypercholesterolemia, unspecified: Secondary | ICD-10-CM | POA: Insufficient documentation

## 2015-07-06 DIAGNOSIS — I10 Essential (primary) hypertension: Secondary | ICD-10-CM | POA: Insufficient documentation

## 2015-07-06 DIAGNOSIS — R079 Chest pain, unspecified: Secondary | ICD-10-CM

## 2015-07-06 DIAGNOSIS — M549 Dorsalgia, unspecified: Secondary | ICD-10-CM | POA: Diagnosis not present

## 2015-07-06 DIAGNOSIS — M79642 Pain in left hand: Secondary | ICD-10-CM | POA: Diagnosis not present

## 2015-07-06 DIAGNOSIS — Z792 Long term (current) use of antibiotics: Secondary | ICD-10-CM | POA: Insufficient documentation

## 2015-07-06 LAB — BASIC METABOLIC PANEL
Anion gap: 11 (ref 5–15)
BUN: 10 mg/dL (ref 6–20)
CALCIUM: 9.5 mg/dL (ref 8.9–10.3)
CO2: 25 mmol/L (ref 22–32)
CREATININE: 0.75 mg/dL (ref 0.61–1.24)
Chloride: 104 mmol/L (ref 101–111)
Glucose, Bld: 117 mg/dL — ABNORMAL HIGH (ref 65–99)
Potassium: 3.6 mmol/L (ref 3.5–5.1)
SODIUM: 140 mmol/L (ref 135–145)

## 2015-07-06 LAB — CBC
HCT: 45.1 % (ref 39.0–52.0)
Hemoglobin: 15.2 g/dL (ref 13.0–17.0)
MCH: 30.4 pg (ref 26.0–34.0)
MCHC: 33.7 g/dL (ref 30.0–36.0)
MCV: 90.2 fL (ref 78.0–100.0)
PLATELETS: 239 10*3/uL (ref 150–400)
RBC: 5 MIL/uL (ref 4.22–5.81)
RDW: 13.6 % (ref 11.5–15.5)
WBC: 15.3 10*3/uL — AB (ref 4.0–10.5)

## 2015-07-06 LAB — I-STAT TROPONIN, ED: TROPONIN I, POC: 0.01 ng/mL (ref 0.00–0.08)

## 2015-07-06 MED ORDER — TRAMADOL HCL 50 MG PO TABS
50.0000 mg | ORAL_TABLET | Freq: Four times a day (QID) | ORAL | Status: DC | PRN
Start: 2015-07-06 — End: 2015-12-18

## 2015-07-06 MED ORDER — IBUPROFEN 200 MG PO TABS
400.0000 mg | ORAL_TABLET | Freq: Once | ORAL | Status: AC
  Administered 2015-07-06: 400 mg via ORAL
  Filled 2015-07-06: qty 2

## 2015-07-06 NOTE — ED Notes (Signed)
Pt reports left chest burning and arm pressure onset Tuesday.

## 2015-07-06 NOTE — ED Provider Notes (Signed)
CSN: [id number]     Arrival date & time 07/06/15  1159 History   First MD Initiated Contact with Patient 07/06/15 1219     Chief Complaint  Patient presents with  . Chest Pain     (Consider location/radiation/quality/duration/timing/severity/associated sxs/prior Treatment) Patient is a 45 y.o. male presenting with chest pain. The history is provided by the patient.  Chest Pain Associated symptoms: headache   Associated symptoms: no abdominal pain, no back pain, no fever, no shortness of breath and not vomiting   Patient notes persistent pain entire chest bilaterally, back, and bilateral hands for the past 3- 4 days.  Symptoms persistent since onset. Pain mild-mod. Symptom occur randomly, at rest. Patient denies positional change. No relation to exertion or activity level. No associated nv, diaphoresis or sob.  No pleuritic pain. No leg pain or swelling. Occasional non prod cough, no recent increase. Smoker. Denies drug use.  States father had unspecific heart problem and died at age 44. No other fam hx cad/premature cad.  No fever or chills.  Denies chest wall injury or strain.         Past Medical History  Diagnosis Date  . Polio   . Hypertension   . Hypercholesteremia    Past Surgical History  Procedure Laterality Date  . Hip surgery Left 1987    for polio, not sure what was done  . Knee surgery Left 1987    for polio   Family History  Problem Relation Age of Onset  . Hypertension Mother   . Heart failure Father    Social History  Substance Use Topics  . Smoking status: Current Every Day Smoker -- 1.00 packs/day    Types: Cigarettes  . Smokeless tobacco: Never Used  . Alcohol Use: Yes     Comment: occ    Review of Systems  Constitutional: Negative for fever.  HENT: Negative for sore throat.   Eyes: Negative for redness.  Respiratory: Negative for shortness of breath.   Cardiovascular: Positive for chest pain. Negative for leg swelling.  Gastrointestinal:  Negative for vomiting and abdominal pain.  Genitourinary: Negative for flank pain.  Musculoskeletal: Negative for back pain and neck pain.  Skin: Negative for rash.  Neurological: Positive for headaches.       Pt notes intermittent frontal headaches in past week, mild. No current head pain.   Hematological: Does not bruise/bleed easily.  Psychiatric/Behavioral: Negative for confusion.      Allergies  Review of patient's allergies indicates no known allergies.  Home Medications   Prior to Admission medications   Medication Sig Start Date End Date Taking? Authorizing Provider  amoxicillin (AMOXIL) 500 MG capsule Take 1 capsule (500 mg total) by mouth 3 (three) times daily. 05/08/15   Teressa Lower, NP  aspirin EC 81 MG tablet Take 81 mg by mouth daily.    Historical Provider, MD  hydrochlorothiazide (HYDRODIURIL) 25 MG tablet Take 25 mg by mouth daily.     Historical Provider, MD  ibuprofen (ADVIL,MOTRIN) 600 MG tablet Take 1 tablet (600 mg total) by mouth every 6 (six) hours as needed. Patient not taking: Reported on 04/10/2015 07/25/14   Devoria Albe, MD  lisinopril (PRINIVIL,ZESTRIL) 40 MG tablet Take 40 mg by mouth daily.      Historical Provider, MD  lovastatin (MEVACOR) 20 MG tablet Take 1 tablet (20 mg total) by mouth at bedtime. 11/29/14   Chrystie Nose, MD  methocarbamol (ROBAXIN) 500 MG tablet Take 1 or 2 po QID for  soreness in muscles Patient not taking: Reported on 04/10/2015 07/25/14   Devoria Albe, MD  naproxen (NAPROSYN) 500 MG tablet Take 500 mg by mouth 2 (two) times daily with a meal. 03/20/15   Historical Provider, MD  nicotine (EQ NICOTINE) 21 mg/24hr patch Place 1 patch (21 mg total) onto the skin daily. Patient not taking: Reported on 04/10/2015 11/29/14   Chrystie Nose, MD  Omega-3 Fatty Acids (OMEGA 3 PO) Take 400 mg by mouth 3 (three) times daily.    Historical Provider, MD  tadalafil (CIALIS) 10 MG tablet Take 10 mg by mouth daily as needed for erectile dysfunction.     Historical Provider, MD   BP 141/92 mmHg  Pulse 92  Temp(Src) 98 F (36.7 C) (Oral)  Resp 16  SpO2 100% Physical Exam  Constitutional: He appears well-developed and well-nourished. No distress.  HENT:  Head: Atraumatic.  Mouth/Throat: Oropharynx is clear and moist.  No sinus or temporal tenderness.   Eyes: Conjunctivae are normal. Pupils are equal, round, and reactive to light. No scleral icterus.  Neck: Neck supple. No tracheal deviation present.  Cardiovascular: Normal rate, regular rhythm, normal heart sounds and intact distal pulses.  Exam reveals no gallop and no friction rub.   No murmur heard. Pulmonary/Chest: Effort normal and breath sounds normal. No accessory muscle usage. No respiratory distress.  Mild chest wall tenderness. No sts, focal/bony tenderness, sts or skin lesions noted.   Abdominal: Soft. Bowel sounds are normal. He exhibits no distension. There is no tenderness.  Musculoskeletal: Normal range of motion. He exhibits no edema or tenderness.  Radial pulse 2+ bil. Good rom bil upper ext. No hand swelling, skin changes, erythema, or other abn finding on exam.   Neurological: He is alert.  Skin: Skin is warm and dry. No rash noted.  Psychiatric: He has a normal mood and affect.  Nursing note and vitals reviewed.   ED Course  Procedures (including critical care time) Labs Review  Results for orders placed or performed during the hospital encounter of 07/06/15  Basic metabolic panel  Result Value Ref Range   Sodium 140 135 - 145 mmol/L   Potassium 3.6 3.5 - 5.1 mmol/L   Chloride 104 101 - 111 mmol/L   CO2 25 22 - 32 mmol/L   Glucose, Bld 117 (H) 65 - 99 mg/dL   BUN 10 6 - 20 mg/dL   Creatinine, Ser 4.54 0.61 - 1.24 mg/dL   Calcium 9.5 8.9 - 09.8 mg/dL   GFR calc non Af Amer >60 >60 mL/min   GFR calc Af Amer >60 >60 mL/min   Anion gap 11 5 - 15  CBC  Result Value Ref Range   WBC 15.3 (H) 4.0 - 10.5 K/uL   RBC 5.00 4.22 - 5.81 MIL/uL   Hemoglobin 15.2  13.0 - 17.0 g/dL   HCT 11.9 14.7 - 82.9 %   MCV 90.2 78.0 - 100.0 fL   MCH 30.4 26.0 - 34.0 pg   MCHC 33.7 30.0 - 36.0 g/dL   RDW 56.2 13.0 - 86.5 %   Platelets 239 150 - 400 K/uL  I-stat troponin, ED (not at [facility], [facility])  Result Value Ref Range   Troponin i, poc 0.01 0.00 - 0.08 ng/mL   Comment 3           Dg Chest 2 View  07/06/2015  CLINICAL DATA:  Acute chest pain. EXAM: CHEST  2 VIEW COMPARISON:  April 10, 2015. FINDINGS: The heart size and  mediastinal contours are within normal limits. Both lungs are clear. No pneumothorax or pleural effusion is noted. The visualized skeletal structures are unremarkable. IMPRESSION: No active cardiopulmonary disease. Electronically Signed   By: Lupita Raider, M.D.   On: 07/06/2015 12:44      I have personally reviewed and evaluated these images and lab results as part of my medical decision-making.   EKG Interpretation   Date/Time:  Saturday July 06 2015 12:11:40 EST Ventricular Rate:  95 PR Interval:  158 QRS Duration: 87 QT Interval:  337 QTC Calculation: 424 R Axis:   69 Text Interpretation:  Sinus rhythm No significant change since last  tracing Confirmed by Jaxsyn Azam  MD, Caryn Bee ([id number]) on 07/06/2015 12:19:55 PM      MDM   Iv ns. Labs. Ecg. Cxr.  Motrin po for symptom relief.   Reviewed nursing notes and prior charts for additional history.   Cardiology notes from 2016 - had 'low risk' stress test, and had medical therapy recommended.    Pt notes pain seems improved 'w massage' therapy.  Motrin po for symptom relief.  After symptoms for prolonged period/days, trop neg.  Symptoms/ed eval do not appear c/w acs.  Labs/cxr c/w baseline.  Pt comfortable appearing. No increased wob.  For chest discomfort, rec close outpatient pcp and cardiology follow up.  Return precautions provided.       Cathren Laine, MD 07/06/15 1341

## 2015-07-06 NOTE — Discharge Instructions (Signed)
It was our pleasure to provide your ER care today - we hope that you feel better.  You may take motrin or aleve as need for pain.  You may also take ultram as need for pain - no driving when taking ultram.  If reflux/heartburn symptoms, you may try pepcid and/or maalox as need.   Follow up with your cardiologist in the next 1-2 weeks - call office Monday to arrange appointment.   Also follow up with your primary care doctor in coming week.    Return to ER if worse, new symptoms, fevers, trouble breathing, persistent/recurrent chest pain, other concern.     Nonspecific Chest Pain  Chest pain can be caused by many different conditions. There is always a chance that your pain could be related to something serious, such as a heart attack or a blood clot in your lungs. Chest pain can also be caused by conditions that are not life-threatening. If you have chest pain, it is very important to follow up with your health care provider. CAUSES  Chest pain can be caused by:  Heartburn.  Pneumonia or bronchitis.  Anxiety or stress.  Inflammation around your heart (pericarditis) or lung (pleuritis or pleurisy).  A blood clot in your lung.  A collapsed lung (pneumothorax). It can develop suddenly on its own (spontaneous pneumothorax) or from trauma to the chest.  Shingles infection (varicella-zoster virus).  Heart attack.  Damage to the bones, muscles, and cartilage that make up your chest wall. This can include:  Bruised bones due to injury.  Strained muscles or cartilage due to frequent or repeated coughing or overwork.  Fracture to one or more ribs.  Sore cartilage due to inflammation (costochondritis). RISK FACTORS  Risk factors for chest pain may include:  Activities that increase your risk for trauma or injury to your chest.  Respiratory infections or conditions that cause frequent coughing.  Medical conditions or overeating that can cause heartburn.  Heart disease or  family history of heart disease.  Conditions or health behaviors that increase your risk of developing a blood clot.  Having had chicken pox (varicella zoster). SIGNS AND SYMPTOMS Chest pain can feel like:  Burning or tingling on the surface of your chest or deep in your chest.  Crushing, pressure, aching, or squeezing pain.  Dull or sharp pain that is worse when you move, cough, or take a deep breath.  Pain that is also felt in your back, neck, shoulder, or arm, or pain that spreads to any of these areas. Your chest pain may come and go, or it may stay constant. DIAGNOSIS Lab tests or other studies may be needed to find the cause of your pain. Your health care provider may have you take a test called an ambulatory ECG (electrocardiogram). An ECG records your heartbeat patterns at the time the test is performed. You may also have other tests, such as:  Transthoracic echocardiogram (TTE). During echocardiography, sound waves are used to create a picture of all of the heart structures and to look at how blood flows through your heart.  Transesophageal echocardiogram (TEE).This is a more advanced imaging test that obtains images from inside your body. It allows your health care provider to see your heart in finer detail.  Cardiac monitoring. This allows your health care provider to monitor your heart rate and rhythm in real time.  Holter monitor. This is a portable device that records your heartbeat and can help to diagnose abnormal heartbeats. It allows your health care  provider to track your heart activity for several days, if needed.  Stress tests. These can be done through exercise or by taking medicine that makes your heart beat more quickly.  Blood tests.  Imaging tests. TREATMENT  Your treatment depends on what is causing your chest pain. Treatment may include:  Medicines. These may include:  Acid blockers for heartburn.  Anti-inflammatory medicine.  Pain medicine for  inflammatory conditions.  Antibiotic medicine, if an infection is present.  Medicines to dissolve blood clots.  Medicines to treat coronary artery disease.  Supportive care for conditions that do not require medicines. This may include:  Resting.  Applying heat or cold packs to injured areas.  Limiting activities until pain decreases. HOME CARE INSTRUCTIONS  If you were prescribed an antibiotic medicine, finish it all even if you start to feel better.  Avoid any activities that bring on chest pain.  Do not use any tobacco products, including cigarettes, chewing tobacco, or electronic cigarettes. If you need help quitting, ask your health care provider.  Do not drink alcohol.  Take medicines only as directed by your health care provider.  Keep all follow-up visits as directed by your health care provider. This is important. This includes any further testing if your chest pain does not go away.  If heartburn is the cause for your chest pain, you may be told to keep your head raised (elevated) while sleeping. This reduces the chance that acid will go from your stomach into your esophagus.  Make lifestyle changes as directed by your health care provider. These may include:  Getting regular exercise. Ask your health care provider to suggest some activities that are safe for you.  Eating a heart-healthy diet. A registered dietitian can help you to learn healthy eating options.  Maintaining a healthy weight.  Managing diabetes, if necessary.  Reducing stress. SEEK MEDICAL CARE IF:  Your chest pain does not go away after treatment.  You have a rash with blisters on your chest.  You have a fever. SEEK IMMEDIATE MEDICAL CARE IF:   Your chest pain is worse.  You have an increasing cough, or you cough up blood.  You have severe abdominal pain.  You have severe weakness.  You faint.  You have chills.  You have sudden, unexplained chest discomfort.  You have sudden,  unexplained discomfort in your arms, back, neck, or jaw.  You have shortness of breath at any time.  You suddenly start to sweat, or your skin gets clammy.  You feel nauseous or you vomit.  You suddenly feel light-headed or dizzy.  Your heart begins to beat quickly, or it feels like it is skipping beats. These symptoms may represent a serious problem that is an emergency. Do not wait to see if the symptoms will go away. Get medical help right away. Call your local emergency services (911 in the [place] Do not drive yourself to the hospital.   This information is not intended to replace advice given to you by your health care provider. Make sure you discuss any questions you have with your health care provider.   Document Released: 02/25/2005 Document Revised: 06/08/2014 Document Reviewed: 12/22/2013 Elsevier Interactive Patient Education 2016 [employer]     Chest Wall Pain Chest wall pain is pain in or around the bones and muscles of your chest. Sometimes, an injury causes this pain. Sometimes, the cause may not be known. This pain may take several weeks or longer to get better. HOME CARE Pay attention  to any changes in your symptoms. Take these actions to help with your pain:  Rest as told by your doctor.  Avoid activities that cause pain. Try not to use your chest, belly (abdominal), or side muscles to lift heavy things.  If directed, apply ice to the painful area:  Put ice in a plastic bag.  Place a towel between your skin and the bag.  Leave the ice on for 20 minutes, 2-3 times per day.  Take over-the-counter and prescription medicines only as told by your doctor.  Do not use tobacco products, including cigarettes, chewing tobacco, and e-cigarettes. If you need help quitting, ask your doctor.  Keep all follow-up visits as told by your doctor. This is important. GET HELP IF:  You have a fever.  Your chest pain gets worse.  You have new symptoms. GET HELP RIGHT  AWAY IF:  You feel sick to your stomach (nauseous) or you throw up (vomit).  You feel sweaty or light-headed.  You have a cough with phlegm (sputum) or you cough up blood.  You are short of breath.   This information is not intended to replace advice given to you by your health care provider. Make sure you discuss any questions you have with your health care provider.   Document Released: 11/04/2007 Document Revised: 02/06/2015 Document Reviewed: 08/13/2014 Elsevier Interactive Patient Education [employer]

## 2015-09-17 ENCOUNTER — Emergency Department (HOSPITAL_COMMUNITY): Payer: Medicaid Other

## 2015-09-17 ENCOUNTER — Emergency Department (HOSPITAL_COMMUNITY)
Admission: EM | Admit: 2015-09-17 | Discharge: 2015-09-17 | Disposition: A | Discharge status: Home / Self Care | Payer: Medicaid Other | Attending: Emergency Medicine | Admitting: Emergency Medicine

## 2015-09-17 ENCOUNTER — Encounter (HOSPITAL_COMMUNITY): Payer: Self-pay

## 2015-09-17 DIAGNOSIS — Z8619 Personal history of other infectious and parasitic diseases: Secondary | ICD-10-CM | POA: Insufficient documentation

## 2015-09-17 DIAGNOSIS — R079 Chest pain, unspecified: Secondary | ICD-10-CM

## 2015-09-17 DIAGNOSIS — Z7982 Long term (current) use of aspirin: Secondary | ICD-10-CM | POA: Diagnosis not present

## 2015-09-17 DIAGNOSIS — Z791 Long term (current) use of non-steroidal anti-inflammatories (NSAID): Secondary | ICD-10-CM | POA: Diagnosis not present

## 2015-09-17 DIAGNOSIS — E7800 Pure hypercholesterolemia, unspecified: Secondary | ICD-10-CM | POA: Diagnosis not present

## 2015-09-17 DIAGNOSIS — Z79899 Other long term (current) drug therapy: Secondary | ICD-10-CM | POA: Diagnosis not present

## 2015-09-17 DIAGNOSIS — I10 Essential (primary) hypertension: Secondary | ICD-10-CM | POA: Diagnosis not present

## 2015-09-17 DIAGNOSIS — F17210 Nicotine dependence, cigarettes, uncomplicated: Secondary | ICD-10-CM | POA: Insufficient documentation

## 2015-09-17 LAB — BASIC METABOLIC PANEL
ANION GAP: 10 (ref 5–15)
BUN: 11 mg/dL (ref 6–20)
CALCIUM: 9.3 mg/dL (ref 8.9–10.3)
CO2: 25 mmol/L (ref 22–32)
CREATININE: 0.75 mg/dL (ref 0.61–1.24)
Chloride: 100 mmol/L — ABNORMAL LOW (ref 101–111)
Glucose, Bld: 105 mg/dL — ABNORMAL HIGH (ref 65–99)
Potassium: 4 mmol/L (ref 3.5–5.1)
SODIUM: 135 mmol/L (ref 135–145)

## 2015-09-17 LAB — CBC
HEMATOCRIT: 42.2 % (ref 39.0–52.0)
Hemoglobin: 15.2 g/dL (ref 13.0–17.0)
MCH: 30.6 pg (ref 26.0–34.0)
MCHC: 36 g/dL (ref 30.0–36.0)
MCV: 85.1 fL (ref 78.0–100.0)
Platelets: 230 10*3/uL (ref 150–400)
RBC: 4.96 MIL/uL (ref 4.22–5.81)
RDW: 13 % (ref 11.5–15.5)
WBC: 14.3 10*3/uL — ABNORMAL HIGH (ref 4.0–10.5)

## 2015-09-17 LAB — I-STAT TROPONIN, ED
Troponin i, poc: 0.02 ng/mL (ref 0.00–0.08)
Troponin i, poc: 0.03 ng/mL (ref 0.00–0.08)

## 2015-09-17 MED ORDER — NAPROXEN 500 MG PO TABS: 500.0000 mg | ORAL_TABLET | Freq: Two times a day (BID) | ORAL | Status: DC

## 2015-09-17 MED ORDER — ASPIRIN 81 MG PO CHEW
324.0000 mg | CHEWABLE_TABLET | Freq: Once | ORAL | Status: AC
  Administered 2015-09-17: 324 mg via ORAL
  Filled 2015-09-17: qty 4

## 2015-09-17 MED ORDER — IOPAMIDOL (ISOVUE-370) INJECTION 76%
100.0000 mL | Freq: Once | INTRAVENOUS | Status: AC | PRN
  Administered 2015-09-17: 100 mL via INTRAVENOUS

## 2015-09-17 MED ORDER — MORPHINE SULFATE (PF) 2 MG/ML IV SOLN
2.0000 mg | Freq: Once | INTRAVENOUS | Status: AC
  Administered 2015-09-17: 2 mg via INTRAVENOUS
  Filled 2015-09-17: qty 1

## 2015-09-17 MED ORDER — CYCLOBENZAPRINE HCL 10 MG PO TABS: 10.0000 mg | ORAL_TABLET | Freq: Two times a day (BID) | ORAL | Status: DC | PRN

## 2015-09-17 NOTE — ED Notes (Signed)
Discharge instructions, follow up care, and rx x2 reviewed with patient. Patient verbalized understanding. 

## 2015-09-17 NOTE — ED Notes (Signed)
Pt here with chest pain x 3 days.  Center chest to bilateral arms.  No increase in activity.  No cough/fever.  No hx of reflux.  Pain does go to center back.  Hx of HTN

## 2015-09-17 NOTE — Discharge Instructions (Signed)
Chest Wall Pain Chest wall pain is pain in or around the bones and muscles of your chest. Sometimes, an injury causes this pain. Sometimes, the cause may not be known. This pain may take several weeks or longer to get better. HOME CARE INSTRUCTIONS  Pay attention to any changes in your symptoms. Take these actions to help with your pain:   Rest as told by your health care provider.   Avoid activities that cause pain. These include any activities that use your chest muscles or your abdominal and side muscles to lift heavy items.   If directed, apply ice to the painful area:  Put ice in a plastic bag.  Place a towel between your skin and the bag.  Leave the ice on for 20 minutes, 2-3 times per day.  Take over-the-counter and prescription medicines only as told by your health care provider.  Do not use tobacco products, including cigarettes, chewing tobacco, and e-cigarettes. If you need help quitting, ask your health care provider.  Keep all follow-up visits as told by your health care provider. This is important. SEEK MEDICAL CARE IF:  You have a fever.  Your chest pain becomes worse.  You have new symptoms. SEEK IMMEDIATE MEDICAL CARE IF:  You have nausea or vomiting.  You feel sweaty or light-headed.  You have a cough with phlegm (sputum) or you cough up blood.  You develop shortness of breath.   This information is not intended to replace advice given to you by your health care provider. Make sure you discuss any questions you have with your health care provider.   You were seen in the ED today for chest pain. This is likely due to musculoskeletal pain. Your blood work and CT scan were normal today. Take Naprosyn and Flexeril as needed for pain and inflammation. Follow up with cardiology for re-evaluation. Please talk to your primary care provider to arrange physical therapy. Return to the ED if you experience severe worsening of your pain, loss of consciousness, fever,  dizziness, weakness.

## 2015-09-19 NOTE — ED Provider Notes (Signed)
CSN: [id number]     Arrival date & time 09/17/15  1217 History   First MD Initiated Contact with Patient 09/17/15 1312     Chief Complaint  Patient presents with  . Chest Pain     (Consider location/radiation/quality/duration/timing/severity/associated sxs/prior Treatment) HPI   John Mcintyre is a 45 y.o M with a pmhx of polio, HTN, HLD who presents to the ED today c/o chest pain. Pt states that 3 days ago he began experiencing bilateral chest pain, bilateral shoulder pain and back pain. Pain is 8/10 and comes and goes. Pain is worsened when pt is driving a car with his hand on the steering wheel. He states that his hand will occasionally go numb. Pt states that he is typically sedentary as he has limited movement in his LE due to polio. Pt states that he has had similar pain in the past but this time the pain is more severe. Pt states that he did not take his BP medication this morning. He denies associated diaphoresis, dizziness, syncope, new weakness, HA, blurry vision.   Past Medical History  Diagnosis Date  . Polio   . Hypertension   . Hypercholesteremia    Past Surgical History  Procedure Laterality Date  . Hip surgery Left 1987    for polio, not sure what was done  . Knee surgery Left 1987    for polio   Family History  Problem Relation Age of Onset  . Hypertension Mother   . Heart failure Father    Social History  Substance Use Topics  . Smoking status: Current Every Day Smoker -- 1.00 packs/day    Types: Cigarettes  . Smokeless tobacco: Never Used  . Alcohol Use: Yes     Comment: occ    Review of Systems  All other systems reviewed and are negative.     Allergies  Review of patient's allergies indicates no known allergies.  Home Medications   Prior to Admission medications   Medication Sig Start Date End Date Taking? Authorizing Provider  aspirin EC 81 MG tablet Take 81 mg by mouth daily.   Yes Historical Provider, MD  hydrochlorothiazide (HYDRODIURIL)  25 MG tablet Take 25 mg by mouth daily.    Yes Historical Provider, MD  lisinopril (PRINIVIL,ZESTRIL) 40 MG tablet Take 40 mg by mouth daily.     Yes Historical Provider, MD  lovastatin (MEVACOR) 20 MG tablet Take 1 tablet (20 mg total) by mouth at bedtime. 11/29/14  Yes Chrystie NoseKenneth C Hilty, MD  naproxen (NAPROSYN) 500 MG tablet Take 500 mg by mouth 2 (two) times daily with a meal. 03/20/15  Yes Historical Provider, MD  omega-3 acid ethyl esters (LOVAZA) 1 g capsule Take 1 g by mouth 3 (three) times daily.   Yes Historical Provider, MD  amoxicillin (AMOXIL) 500 MG capsule Take 1 capsule (500 mg total) by mouth 3 (three) times daily. Patient not taking: Reported on 07/06/2015 05/08/15   Teressa LowerVrinda Pickering, NP  cyclobenzaprine (FLEXERIL) 10 MG tablet Take 1 tablet (10 mg total) by mouth 2 (two) times daily as needed for muscle spasms. 09/17/15   Samantha Tripp Dowless, PA-C  naproxen (NAPROSYN) 500 MG tablet Take 1 tablet (500 mg total) by mouth 2 (two) times daily. 09/17/15   Samantha Tripp Dowless, PA-C  traMADol (ULTRAM) 50 MG tablet Take 1 tablet (50 mg total) by mouth every 6 (six) hours as needed. 07/06/15   Cathren LaineKevin Steinl, MD   BP 147/100 mmHg  Pulse 88  Temp(Src) 98.5 F (  36.9 C) (Oral)  Resp 18  Ht  (1.651 m)  Wt 72.576 kg  BMI 26.63 kg/m2  SpO2 100% Physical Exam  Constitutional: He is oriented to person, place, and time. He appears well-developed and well-nourished. No distress.  HENT:  Head: Normocephalic and atraumatic.  Mouth/Throat: No oropharyngeal exudate.  Eyes: Conjunctivae and EOM are normal. Pupils are equal, round, and reactive to light. Right eye exhibits no discharge. Left eye exhibits no discharge. No scleral icterus.  Cardiovascular: Normal rate, regular rhythm, normal heart sounds and intact distal pulses.  Exam reveals no gallop and no friction rub.   No murmur heard. Pulmonary/Chest: Effort normal and breath sounds normal. No respiratory distress. He has no wheezes. He has  no rales. He exhibits tenderness.  Abdominal: Soft. He exhibits no distension. There is no tenderness. There is no guarding.  Musculoskeletal: Normal range of motion. He exhibits no edema.  Neurological: He is alert and oriented to person, place, and time. No cranial nerve deficit.  Strength 3/5 in LLE and RLE, chronic. Strength 5/5 in RUE and LUE. No sensory deficits. No dysmetria.   Skin: Skin is warm and dry. No rash noted. He is not diaphoretic. No erythema. No pallor.  Psychiatric: He has a normal mood and affect. His behavior is normal.  Nursing note and vitals reviewed.   ED Course  Procedures (including critical care time) Labs Review Labs Reviewed  BASIC METABOLIC PANEL - Abnormal; Notable for the following:    Chloride 100 (*)    Glucose, Bld 105 (*)    All other components within normal limits  CBC - Abnormal; Notable for the following:    WBC 14.3 (*)    All other components within normal limits  I-STAT TROPOININ, ED  I-STAT TROPOININ, ED  Rosezena Sensor, ED    Imaging Review Dg Chest 2 View  09/17/2015  CLINICAL DATA:  Chest and upper back pain EXAM: CHEST  2 VIEW COMPARISON:  07/06/2015 FINDINGS: Normal heart size and mediastinal contours. No acute infiltrate or edema. No effusion or pneumothorax. Upper thoracic dextrocurvature is positional based on previous study. No acute osseous finding. IMPRESSION: No evidence of active disease. Electronically Signed   By: Marnee Spring M.D.   On: 09/17/2015 13:04   Ct Angio Chest Pe W/cm &/or Wo Cm  09/17/2015  CLINICAL DATA:  Chest pain radiates to the arms goes down the back. EXAM: CT ANGIOGRAPHY CHEST WITH CONTRAST TECHNIQUE: Multidetector CT imaging of the chest was performed using the standard protocol during bolus administration of intravenous contrast. Multiplanar CT image reconstructions and MIPs were obtained to evaluate the vascular anatomy. CONTRAST:  100 cc Isovue 370 COMPARISON:  None. FINDINGS: Mediastinum / Lymph  Nodes: Pre contrast imaging was not performed as part of this exam which limits assessment for acute intramural hematoma of the thoracic aorta. Despite this limitation, there is no evidence for thoracic aortic aneurysm. No dissection flap is visible in the thoracic aorta. Pulmonary arteries are well opacified although assessment is mildly degraded by breathing motion. Within this limitation, no filling defect is evident within the opacified pulmonary arteries to suggest the presence of an acute pulmonary embolus. There is no axillary lymphadenopathy. No mediastinal lymphadenopathy. There is no hilar lymphadenopathy. The heart size is normal. No pericardial effusion. The esophagus has normal imaging features. Lungs / Pleura: Paraseptal emphysema seen in the lung apices. 4 mm subpleural nodule seen in the anterior right upper lobe on image 38. 4 mm left upper lobe pulmonary  nodule is seen on image 35 of series 7. An additional 4 mm subpleural left upper lobe nodule is seen on image 41. Compressive atelectasis noted in both lung bases. Upper Abdomen:  Unremarkable. MSK / Soft Tissues: Bone windows reveal no worrisome lytic or sclerotic osseous lesions. There is evidence of bilateral gynecomastia. Review of the MIP images confirms the above findings. IMPRESSION: 1. No CT evidence for acute pulmonary embolus. No dissection of the thoracic aorta. 2. Bilateral tiny pulmonary nodules. No follow-up needed if patient is low-risk (and has no known or suspected primary neoplasm). Non-contrast chest CT can be considered in 12 months if patient is high-risk. This recommendation follows the consensus statement: Guidelines for Management of Incidental Pulmonary Nodules Detected on CT Images:From the Fleischner Society 2017; published online before print (10.1148/radiol.1610960454). 3. Bilateral gynecomastia. Electronically Signed   By: Kennith Center M.D.   On: 09/17/2015 15:00   I have personally reviewed and evaluated these  images and lab results as part of my medical decision-making.   EKG Interpretation None      MDM   Final diagnoses:  Chest pain, unspecified chest pain type   45 y.o M with hx of HTN, HLD presents to the ED with chest pain, bilateral shoulder pain and back pain onset 3 days ago. On presentation to the ED, pt appears well, in NAD. BP elevated to 147/100. Pt did not take his BP medication this morning. Pt is mildly tachycardic at 108. Pt was given ASA and  morphine which relieved his pain. Pt was seen in ED 1 month ago with similar symptoms, had normal serial troponins and was d/c home with cardiology follow up. Pt saw cardiology 1 year ago with similar symptoms and underwent a nuclear stress test which was interpreted as low risk. There was a very small distal anteroapical fixed defect which was suggestive of possible scar. EF was low normal at 52%. No reversible ischemia was seen. EKG today reveals sinus tachycardia, no sign of ischemia. Initial troponin negative. Given history, HTN, and mild tachycardia concern for PE vs dissection. Pt has not had imaging performed in the past. CT angio reveals no sign of PE or dissection. Pain and vital signs have now normalized. Feel that pain is likely musculoskeletal in nature. Repeat troponin wnl. Pt will be d/c home with cardiology f/u. Pt is hemodynamically stable and ready for discharge. Return precautions outlined in patient discharge instructions.   Patient was discussed with and seen by Dr. Fayrene Fearing who agrees with the treatment plan.      Lester Kinsman Norton, PA-C 09/19/15 1144  Rolland Porter, MD 09/27/15 [place] Manners

## 2015-11-08 ENCOUNTER — Other Ambulatory Visit: Payer: Self-pay | Admitting: Internal Medicine

## 2015-11-08 NOTE — Telephone Encounter (Signed)
Rx request sent to pharmacy.  

## 2015-12-16 ENCOUNTER — Telehealth: Payer: Self-pay | Admitting: Internal Medicine

## 2015-12-16 NOTE — Telephone Encounter (Signed)
Pt says he needs to be seen.Chest pain,arm pain and shoulder pain.He feels tired,weak and sleepy all the time.

## 2015-12-16 NOTE — Telephone Encounter (Signed)
Returned call to patient.He stated he has been having chest pain,tired ,no energy.Stated he would like to be seen this week.No chest pain at present.No appointments available with Dr.Hilty.Appointment scheduled with Norma FredricksonLori Gerhardt NP 12/18/15 [facility].Advised to go to ER if needed.

## 2015-12-18 ENCOUNTER — Ambulatory Visit (INDEPENDENT_AMBULATORY_CARE_PROVIDER_SITE_OTHER): Payer: Medicaid Other | Admitting: Nurse Practitioner

## 2015-12-18 ENCOUNTER — Encounter: Payer: Self-pay | Admitting: Nurse Practitioner

## 2015-12-18 ENCOUNTER — Encounter (INDEPENDENT_AMBULATORY_CARE_PROVIDER_SITE_OTHER): Payer: Self-pay

## 2015-12-18 ENCOUNTER — Other Ambulatory Visit: Payer: Self-pay | Admitting: Nurse Practitioner

## 2015-12-18 ENCOUNTER — Encounter: Payer: Self-pay | Admitting: *Deleted

## 2015-12-18 VITALS — BP 132/92 | HR 94 | Ht 65.0 in | Wt 167.8 lb

## 2015-12-18 DIAGNOSIS — R0789 Other chest pain: Secondary | ICD-10-CM

## 2015-12-18 DIAGNOSIS — I10 Essential (primary) hypertension: Secondary | ICD-10-CM | POA: Diagnosis not present

## 2015-12-18 DIAGNOSIS — Z720 Tobacco use: Secondary | ICD-10-CM

## 2015-12-18 DIAGNOSIS — A809 Acute poliomyelitis, unspecified: Secondary | ICD-10-CM

## 2015-12-18 LAB — HEPATIC FUNCTION PANEL
ALT: 15 U/L (ref 9–46)
AST: 15 U/L (ref 10–40)
Albumin: 4.1 g/dL (ref 3.6–5.1)
Alkaline Phosphatase: 63 U/L (ref 40–115)
Bilirubin, Direct: 0 mg/dL (ref <=0.2)
Indirect Bilirubin: 0.3 mg/dL (ref 0.2–1.2)
Total Bilirubin: 0.3 mg/dL (ref 0.2–1.2)
Total Protein: 6.7 g/dL (ref 6.1–8.1)

## 2015-12-18 LAB — LIPID PANEL
Cholesterol: 158 mg/dL (ref 125–200)
HDL: 25 mg/dL — ABNORMAL LOW (ref >=40)
LDL Cholesterol: 73 mg/dL (ref <130)
Total CHOL/HDL Ratio: 6.3 Ratio — ABNORMAL HIGH (ref <=5.0)
Triglycerides: 302 mg/dL — ABNORMAL HIGH (ref <150)
VLDL: 60 mg/dL — ABNORMAL HIGH (ref <30)

## 2015-12-18 LAB — CBC
HCT: 41.7 % (ref 38.5–50.0)
Hemoglobin: 14.3 g/dL (ref 13.2–17.1)
MCH: 30.3 pg (ref 27.0–33.0)
MCHC: 34.3 g/dL (ref 32.0–36.0)
MCV: 88.3 fL (ref 80.0–100.0)
MPV: 10.6 fL (ref 7.5–12.5)
Platelets: 240 10*3/uL (ref 140–400)
RBC: 4.72 MIL/uL (ref 4.20–5.80)
RDW: 13.4 % (ref 11.0–15.0)
WBC: 13.7 10*3/uL — ABNORMAL HIGH (ref 3.8–10.8)

## 2015-12-18 LAB — APTT: aPTT: 28 s (ref 22–34)

## 2015-12-18 LAB — BASIC METABOLIC PANEL
BUN: 9 mg/dL (ref 7–25)
CO2: 22 mmol/L (ref 20–31)
Calcium: 9.4 mg/dL (ref 8.6–10.3)
Chloride: 104 mmol/L (ref 98–110)
Creat: 0.73 mg/dL (ref 0.60–1.35)
Glucose, Bld: 108 mg/dL — ABNORMAL HIGH (ref 65–99)
Potassium: 3.9 mmol/L (ref 3.5–5.3)
Sodium: 139 mmol/L (ref 135–146)

## 2015-12-18 LAB — PROTIME-INR
INR: 1
Prothrombin Time: 10.9 s (ref 9.0–11.5)

## 2015-12-18 MED ORDER — METOPROLOL TARTRATE 25 MG PO TABS: 25.0000 mg | ORAL_TABLET | Freq: Two times a day (BID) | ORAL | Status: DC

## 2015-12-18 MED ORDER — NITROGLYCERIN 0.4 MG SL SUBL: 0.4000 mg | SUBLINGUAL_TABLET | SUBLINGUAL | Status: DC | PRN

## 2015-12-18 NOTE — Progress Notes (Signed)
CARDIOLOGY OFFICE NOTE  Date:  12/18/2015    John Mcintyre Date of Birth: 10/13/1970 Medical Record #[id number]  PCP:  Pcp Not In System  [facility]    Chief Complaint  Patient presents with  . Chest Pain    Work in visit - seen for Dr. Rennis GoldenHilty    History of Present Illness: John Mcintyre is a 45 y.o. male who presents today for a work in visit. Seen for Dr. Rennis GoldenHilty.   He has a history of HTN, HLD, tobacco abuse and prior polio with persistent weakness - requiring assistive devices to ambulate.  Not seen since June of 2016. Had had a Myoview - see below - elected to manage medically.   Phone call earlier this week - "Returned call to patient.He stated he has been having chest pain,tired ,no energy.Stated he would like to be seen this week.No chest pain at present.No appointments available with Dr.Hilty.Appointment scheduled with Norma FredricksonLori Danial Hlavac NP 12/18/15 at 9:[facility] am [facility].Advised to go to ER if needed"  Thus added to my schedule for today.  Comes back today. Here alone. He notes that for the past several months he has had this "hard chest pain" - worse with minimal exertion. Radiates to his neck and back. Very tired. Cannot walk long distances or up stairs without having symptoms. No longer able to have sex due to chest pain.  This is getting worse. Was in the ER back in April - told it was musculoskeletal. Due to symptoms progressing - he called here. He continues to smoke about a pack a day - has done so for 25 years.   Past Medical History  Diagnosis Date  . Polio   . Hypertension   . Hypercholesteremia     Past Surgical History  Procedure Laterality Date  . Hip surgery Left 1987    for polio, not sure what was done  . Knee surgery Left 1987    for polio     Medications: Current Outpatient Prescriptions  Medication Sig Dispense Refill  . aspirin EC 81 MG tablet Take 81 mg by mouth daily.    . cyclobenzaprine (FLEXERIL) 10 MG tablet Take 1  tablet (10 mg total) by mouth 2 (two) times daily as needed for muscle spasms. 20 tablet 0  . hydrochlorothiazide (HYDRODIURIL) 25 MG tablet Take 25 mg by mouth daily.     [name]. lisinopril (PRINIVIL,ZESTRIL) 40 MG tablet Take 40 mg by mouth daily.      [name]. lovastatin (MEVACOR) 20 MG tablet TAKE 1 TABLET (20 MG TOTAL) BY MOUTH AT BEDTIME. 30 tablet 6  . omega-3 acid ethyl esters (LOVAZA) 1 g capsule Take 1 g by mouth 3 (three) times daily.    . metoprolol tartrate (LOPRESSOR) 25 MG tablet Take 1 tablet (25 mg total) by mouth 2 (two) times daily. 60 tablet 3  . nitroGLYCERIN (NITROSTAT) 0.4 MG SL tablet Place 1 tablet (0.4 mg total) under the tongue every 5 (five) minutes as needed for chest pain. 90 tablet 3   No current facility-administered medications for this visit.    Allergies: No Known Allergies  Social History: The patient  reports that he has been smoking Cigarettes.  He has been smoking about 1.00 pack per day. He has never used smokeless tobacco. He reports that he drinks alcohol. He reports that he does not use illicit drugs.   Family History: The patient's family history includes Heart failure in his father; Hypertension in his mother.  Review of Systems: Please see the history of present illness.   Otherwise, the review of systems is positive for none.   All other systems are reviewed and negative.   Physical Exam: VS:  BP 132/92 mmHg  Pulse 94  Ht  (1.651 m)  Wt 167 lb 12.8 oz (76.114 kg)  BMI 27.92 kg/m2 .  BMI Body mass index is 27.92 kg/(m^2).  Wt Readings from Last 3 Encounters:  12/18/15 167 lb 12.8 oz (76.114 kg)  09/17/15 160 lb (72.576 kg)  07/06/15 160 lb (72.576 kg)    General: Pleasant. Well developed, well nourished and in no acute distress.  HEENT: Normal except for poor dentition. Neck: Supple, no JVD, carotid bruits, or masses noted.  Cardiac: Regular rate and rhythm. No murmurs, rubs, or gallops. No edema.  Respiratory:  Lungs are clear to  auscultation bilaterally with normal work of breathing.  GI: Soft and nontender.  MS: No deformity or atrophy. Gait and ROM intact. Skin: Warm and dry. Color is normal.  Neuro:  Strength and sensation are intact and no gross focal deficits noted. But he does have a deformity of the left leg. He is using crutches.  Psych: Alert, appropriate and with normal affect.   LABORATORY DATA:  EKG:  EKG is ordered today. This demonstrates NSR with septal Q's  Lab Results  Component Value Date   WBC 14.3* 09/17/2015   HGB 15.2 09/17/2015   HCT 42.2 09/17/2015   PLT 230 09/17/2015   GLUCOSE 105* 09/17/2015   ALT 17 03/21/2013   AST 17 03/21/2013   NA 135 09/17/2015   K 4.0 09/17/2015   CL 100* 09/17/2015   CREATININE 0.75 09/17/2015   BUN 11 09/17/2015   CO2 25 09/17/2015   INR 1.0 05/02/2008    BNP (last 3 results) No results for input(s): BNP in the last 8760 hours.  ProBNP (last 3 results) No results for input(s): PROBNP in the last 8760 hours.   Other Studies Reviewed Today: Myoview Study Highlights from 09/2014    Mildly abnormal Lexiscan myoview with a small anterior non transmural scar with mild peri-infarct ischemia. The patient will follow up with Dr. Rennis Golden.      Assessment/Plan: 1. Chest pain - multiple CV risk factors - sounds like angina. Has had prior abnormal Myoview - cardiac catheterization recommended and discussed in depth with him. Adding Lopressor 25 mg BID and gave RX for NTG sl prn. He is asked to restrict his activities. The patient understands that risks include but are not limited to stroke (1 in 1000), death (1 in 1000), kidney failure [usually temporary] (1 in 500), bleeding (1 in 200), allergic reaction [possibly serious] (1 in 200), and agrees to proceed. He understands that he needs someone to drive him home (wife does not drive). 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   2. HTN - adding Lopressor today.   3. HLD - checking lab  today  4. Tobacco abuse - not ready to stop  Current medicines are reviewed with the patient today.  The patient does not have concerns regarding medicines other than what has been noted above.  The following changes have been made:  See above.  Labs/ tests ordered today include:    Orders Placed This Encounter  Procedures  . Basic metabolic panel  . CBC  . Protime-INR  . APTT  . EKG 12-Lead     Disposition:   Further disposition to follow. Cardiac cath arranged  for Monday with Dr. Clifton James due to patient's request. He is told multiple times to get a driver.    Patient is agreeable to this plan and will call if any problems develop in the interim.   Signed: Rosalio Macadamia, RN, ANP-C 12/18/2015 10:07 AM  [facility] [address] [place], [place]  [address] Phone: [phone] Fax: [phone]

## 2015-12-18 NOTE — Patient Instructions (Addendum)
We will be checking the following labs today - BMET, CBC, PT, HPF, lipids and PTT   Medication Instructions:    Continue with your current medicines. BUT  I am adding Lopressor 25 mg to take twice a day I am sending in RX for NTG - Use your NTG under your tongue for recurrent chest pain. May take one tablet every 5 minutes. If you are still having discomfort after 3 tablets in 15 minutes, call 911.     Testing/Procedures To Be Arranged:  Cardiac catheterization with Dr. Clifton JamesMcAlhany on Monday  You need to arrange for someone to drive you.   Other Special Instructions:  Your provider has recommended a cardiac catherization  You are scheduled for a cardiac catheterization on Monday, December 23, 2015 at 9 AM with Dr. Clifton JamesMcAlhany or associate.  [facility] Monday, July 24th at 7am.  Enter thru the [employer] [facility] entrance A No food or drink after midnight on Sunday. You may take your medications with a sip of water on the day of your procedure.   Coronary Angiogram A coronary angiogram, also called coronary angiography, is an X-ray procedure used to look at the arteries in the heart. In this procedure, a dye (contrast dye) is injected through a long, hollow tube (catheter). The catheter is about the size of a piece of cooked spaghetti and is inserted through your groin, wrist, or arm. The dye is injected into each artery, and X-rays are then taken to show if there is a blockage in the arteries of your heart.  LET [facility] KNOW ABOUT: Any allergies you have, including allergies to shellfish or contrast dye.  All medicines you are taking, including vitamins, herbs, eye drops, creams, and over-the-counter medicines.  Previous problems you or members of your family have had with the use of anesthetics.  Any blood disorders you have.  Previous surgeries you have had. History of kidney problems or failure.  Other medical conditions you  have.  RISKS AND COMPLICATIONS  Generally, a coronary angiogram is a safe procedure. However, about 1 person out of 1000 can have problems that may include: Allergic reaction to the dye. Bleeding/bruising from the access site or other locations. Kidney injury, especially in people with impaired kidney function. Stroke (rare). Heart attack (rare). Irregular rhythms (rare) Death (rare)  BEFORE THE PROCEDURE  Do not eat or drink anything after midnight the night before the procedure or as directed by your health care provider.  Ask your health care provider about changing or stopping your regular medicines. This is especially important if you are taking diabetes medicines or blood thinners.  PROCEDURE You may be given a medicine to help you relax (sedative) before the procedure. This medicine is given through an intravenous (IV) access tube that is inserted into one of your veins.  The area where the catheter will be inserted will be washed and shaved. This is usually done in the groin but may be done in the fold of your arm (near your elbow) or in the wrist.  A medicine will be given to numb the area where the catheter will be inserted (local anesthetic).  The health care provider will insert the catheter into an artery. The catheter will be guided by using a special type of X-ray (fluoroscopy) of the blood vessel being examined.  A special dye will then be injected into the catheter, and X-rays will be taken. The dye will help to show  where any narrowing or blockages are located in the heart arteries.    AFTER THE PROCEDURE  If the procedure is done through the leg, you will be kept in bed lying flat for several hours. You will be instructed to not bend or cross your legs. The insertion site will be checked frequently.  The pulse in your feet or wrist will be checked frequently.  Additional blood tests, X-rays, and an electrocardiogram may be done.      If you need a refill  on your cardiac medications before your next appointment, please call your pharmacy.   Call the [facility] office at [phone] if you have any questions, problems or concerns.

## 2015-12-19 ENCOUNTER — Other Ambulatory Visit: Payer: Self-pay | Admitting: Nurse Practitioner

## 2015-12-23 ENCOUNTER — Ambulatory Visit (HOSPITAL_COMMUNITY)
Admission: RE | Admit: 2015-12-23 | Discharge: 2015-12-24 | Disposition: A | Discharge status: Home / Self Care | Payer: Medicaid Other | Source: Ambulatory Visit | Attending: Cardiovascular Disease | Admitting: Cardiovascular Disease

## 2015-12-23 ENCOUNTER — Encounter (HOSPITAL_COMMUNITY)
Admission: RE | Disposition: A | Discharge status: Home / Self Care | Payer: Self-pay | Source: Ambulatory Visit | Attending: Cardiovascular Disease

## 2015-12-23 ENCOUNTER — Encounter (HOSPITAL_COMMUNITY): Payer: Self-pay

## 2015-12-23 DIAGNOSIS — E785 Hyperlipidemia, unspecified: Secondary | ICD-10-CM | POA: Insufficient documentation

## 2015-12-23 DIAGNOSIS — Z8249 Family history of ischemic heart disease and other diseases of the circulatory system: Secondary | ICD-10-CM | POA: Insufficient documentation

## 2015-12-23 DIAGNOSIS — A809 Acute poliomyelitis, unspecified: Secondary | ICD-10-CM | POA: Diagnosis present

## 2015-12-23 DIAGNOSIS — I2510 Atherosclerotic heart disease of native coronary artery without angina pectoris: Secondary | ICD-10-CM

## 2015-12-23 DIAGNOSIS — G14 Postpolio syndrome: Secondary | ICD-10-CM | POA: Insufficient documentation

## 2015-12-23 DIAGNOSIS — E7800 Pure hypercholesterolemia, unspecified: Secondary | ICD-10-CM | POA: Diagnosis present

## 2015-12-23 DIAGNOSIS — I25110 Atherosclerotic heart disease of native coronary artery with unstable angina pectoris: Secondary | ICD-10-CM

## 2015-12-23 DIAGNOSIS — Z9861 Coronary angioplasty status: Secondary | ICD-10-CM

## 2015-12-23 DIAGNOSIS — Z7982 Long term (current) use of aspirin: Secondary | ICD-10-CM | POA: Insufficient documentation

## 2015-12-23 DIAGNOSIS — I10 Essential (primary) hypertension: Secondary | ICD-10-CM | POA: Diagnosis not present

## 2015-12-23 DIAGNOSIS — I2582 Chronic total occlusion of coronary artery: Secondary | ICD-10-CM | POA: Diagnosis not present

## 2015-12-23 DIAGNOSIS — Z720 Tobacco use: Secondary | ICD-10-CM | POA: Diagnosis present

## 2015-12-23 DIAGNOSIS — F17210 Nicotine dependence, cigarettes, uncomplicated: Secondary | ICD-10-CM | POA: Diagnosis not present

## 2015-12-23 DIAGNOSIS — Z23 Encounter for immunization: Secondary | ICD-10-CM | POA: Diagnosis not present

## 2015-12-23 DIAGNOSIS — I200 Unstable angina: Secondary | ICD-10-CM | POA: Diagnosis present

## 2015-12-23 HISTORY — PX: CARDIAC CATHETERIZATION: SHX172

## 2015-12-23 HISTORY — DX: Atherosclerotic heart disease of native coronary artery without angina pectoris: I25.10

## 2015-12-23 HISTORY — PX: CORONARY STENT PLACEMENT: SHX1402

## 2015-12-23 LAB — POCT ACTIVATED CLOTTING TIME: Activated Clotting Time: 312 seconds

## 2015-12-23 SURGERY — LEFT HEART CATH AND CORONARY ANGIOGRAPHY

## 2015-12-23 MED ORDER — CLOPIDOGREL BISULFATE 300 MG PO TABS
ORAL_TABLET | ORAL | Status: AC
  Filled 2015-12-23: qty 1

## 2015-12-23 MED ORDER — HEPARIN (PORCINE) IN NACL 2-0.9 UNIT/ML-% IJ SOLN
INTRAMUSCULAR | Status: AC
  Filled 2015-12-23: qty 1000

## 2015-12-23 MED ORDER — NICOTINE 7 MG/24HR TD PT24: 7.0000 mg | MEDICATED_PATCH | Freq: Every day | TRANSDERMAL | Status: DC

## 2015-12-23 MED ORDER — ANGIOPLASTY BOOK
Freq: Once | Status: AC
  Administered 2015-12-23: 23:00:00
  Filled 2015-12-23: qty 1

## 2015-12-23 MED ORDER — SODIUM CHLORIDE 0.9 % IV SOLN
INTRAVENOUS | Status: DC
  Administered 2015-12-23: 08:00:00 via INTRAVENOUS

## 2015-12-23 MED ORDER — MIDAZOLAM HCL 2 MG/2ML IJ SOLN
INTRAMUSCULAR | Status: AC
  Filled 2015-12-23: qty 2

## 2015-12-23 MED ORDER — ASPIRIN 81 MG PO CHEW
81.0000 mg | CHEWABLE_TABLET | ORAL | Status: AC
  Administered 2015-12-23: 81 mg via ORAL

## 2015-12-23 MED ORDER — ONDANSETRON HCL 4 MG/2ML IJ SOLN: 4.0000 mg | Freq: Four times a day (QID) | INTRAMUSCULAR | Status: DC | PRN

## 2015-12-23 MED ORDER — SODIUM CHLORIDE 0.9 % IV SOLN: 250.0000 mL | INTRAVENOUS | Status: DC | PRN

## 2015-12-23 MED ORDER — ACETAMINOPHEN 325 MG PO TABS: 650.0000 mg | ORAL_TABLET | ORAL | Status: DC | PRN

## 2015-12-23 MED ORDER — CLOPIDOGREL BISULFATE 300 MG PO TABS
ORAL_TABLET | ORAL | Status: DC | PRN
  Administered 2015-12-23: 600 mg via ORAL

## 2015-12-23 MED ORDER — LIDOCAINE HCL (PF) 1 % IJ SOLN
INTRAMUSCULAR | Status: AC
  Filled 2015-12-23: qty 30

## 2015-12-23 MED ORDER — SODIUM CHLORIDE 0.9% FLUSH: 3.0000 mL | INTRAVENOUS | Status: DC | PRN

## 2015-12-23 MED ORDER — NITROGLYCERIN 0.4 MG SL SUBL: 0.4000 mg | SUBLINGUAL_TABLET | SUBLINGUAL | Status: DC | PRN

## 2015-12-23 MED ORDER — SODIUM CHLORIDE 0.9% FLUSH
3.0000 mL | Freq: Two times a day (BID) | INTRAVENOUS | Status: DC
  Administered 2015-12-23 (×2): 3 mL via INTRAVENOUS

## 2015-12-23 MED ORDER — SODIUM CHLORIDE 0.9 % IV SOLN
INTRAVENOUS | Status: AC
  Administered 2015-12-23: 11:00:00 via INTRAVENOUS

## 2015-12-23 MED ORDER — HEPARIN SODIUM (PORCINE) 1000 UNIT/ML IJ SOLN
INTRAMUSCULAR | Status: AC
  Filled 2015-12-23: qty 1

## 2015-12-23 MED ORDER — VERAPAMIL HCL 2.5 MG/ML IV SOLN
INTRAVENOUS | Status: DC | PRN
  Administered 2015-12-23: 10 mL via INTRA_ARTERIAL

## 2015-12-23 MED ORDER — MIDAZOLAM HCL 2 MG/2ML IJ SOLN
INTRAMUSCULAR | Status: DC | PRN
  Administered 2015-12-23: 1 mg via INTRAVENOUS

## 2015-12-23 MED ORDER — DIPHENHYDRAMINE HCL 25 MG PO CAPS
25.0000 mg | ORAL_CAPSULE | Freq: Once | ORAL | Status: AC
  Administered 2015-12-23: 23:00:00 25 mg via ORAL
  Filled 2015-12-23: qty 1

## 2015-12-23 MED ORDER — HEPARIN SODIUM (PORCINE) 1000 UNIT/ML IJ SOLN
INTRAMUSCULAR | Status: DC | PRN
  Administered 2015-12-23: 6500 [IU] via INTRAVENOUS
  Administered 2015-12-23: 3500 [IU] via INTRAVENOUS

## 2015-12-23 MED ORDER — PNEUMOCOCCAL VAC POLYVALENT 25 MCG/0.5ML IJ INJ
0.5000 mL | INJECTION | INTRAMUSCULAR | Status: AC
Start: 2015-12-24 — End: 2015-12-24
  Administered 2015-12-24: 12:00:00 0.5 mL via INTRAMUSCULAR
  Filled 2015-12-23: qty 0.5

## 2015-12-23 MED ORDER — ASPIRIN EC 81 MG PO TBEC
81.0000 mg | DELAYED_RELEASE_TABLET | Freq: Every day | ORAL | Status: DC
  Administered 2015-12-24: 09:00:00 81 mg via ORAL
  Filled 2015-12-23: qty 1

## 2015-12-23 MED ORDER — METOPROLOL TARTRATE 25 MG PO TABS
25.0000 mg | ORAL_TABLET | Freq: Two times a day (BID) | ORAL | Status: DC
  Administered 2015-12-23 – 2015-12-24 (×3): 25 mg via ORAL
  Filled 2015-12-23 (×3): qty 1

## 2015-12-23 MED ORDER — LIDOCAINE HCL (PF) 1 % IJ SOLN
INTRAMUSCULAR | Status: DC | PRN
  Administered 2015-12-23: 3 mL via INTRADERMAL

## 2015-12-23 MED ORDER — CYCLOBENZAPRINE HCL 10 MG PO TABS: 10.0000 mg | ORAL_TABLET | Freq: Two times a day (BID) | ORAL | Status: DC | PRN

## 2015-12-23 MED ORDER — IOPAMIDOL (ISOVUE-370) INJECTION 76%
INTRAVENOUS | Status: AC
  Filled 2015-12-23: qty 100

## 2015-12-23 MED ORDER — IOPAMIDOL (ISOVUE-370) INJECTION 76%
INTRAVENOUS | Status: DC | PRN
  Administered 2015-12-23: 110 mL via INTRA_ARTERIAL

## 2015-12-23 MED ORDER — DIAZEPAM 5 MG PO TABS
ORAL_TABLET | ORAL | Status: AC
  Administered 2015-12-23: 10 mg via ORAL
  Filled 2015-12-23: qty 2

## 2015-12-23 MED ORDER — CLOPIDOGREL BISULFATE 75 MG PO TABS
75.0000 mg | ORAL_TABLET | Freq: Every day | ORAL | Status: DC
  Administered 2015-12-24: 09:00:00 75 mg via ORAL
  Filled 2015-12-23: qty 1

## 2015-12-23 MED ORDER — PRAVASTATIN SODIUM 40 MG PO TABS
80.0000 mg | ORAL_TABLET | Freq: Every day | ORAL | Status: DC
  Administered 2015-12-23: 80 mg via ORAL
  Filled 2015-12-23: qty 2

## 2015-12-23 MED ORDER — HEPARIN (PORCINE) IN NACL 2-0.9 UNIT/ML-% IJ SOLN
INTRAMUSCULAR | Status: DC | PRN
  Administered 2015-12-23: 1000 mL

## 2015-12-23 MED ORDER — DIAZEPAM 5 MG PO TABS
10.0000 mg | ORAL_TABLET | ORAL | Status: AC
  Administered 2015-12-23: 10 mg via ORAL

## 2015-12-23 MED ORDER — ASPIRIN 81 MG PO CHEW
CHEWABLE_TABLET | ORAL | Status: AC
  Administered 2015-12-23: 81 mg via ORAL
  Filled 2015-12-23: qty 1

## 2015-12-23 MED ORDER — VERAPAMIL HCL 2.5 MG/ML IV SOLN
INTRAVENOUS | Status: AC
  Filled 2015-12-23: qty 2

## 2015-12-23 MED ORDER — NITROGLYCERIN 1 MG/10 ML FOR IR/CATH LAB
INTRA_ARTERIAL | Status: AC
  Filled 2015-12-23: qty 10

## 2015-12-23 SURGICAL SUPPLY — 19 items
BALLN EMERGE MR 2.25X12 (BALLOONS) ×2
BALLN ~~LOC~~ EMERGE MR 3.75X12 (BALLOONS) ×2
BALLOON EMERGE MR 2.25X12 (BALLOONS) ×1 IMPLANT
BALLOON ~~LOC~~ EMERGE MR 3.75X12 (BALLOONS) ×1 IMPLANT
CATH INFINITI 5 FR JL3.5 (CATHETERS) ×2 IMPLANT
CATH INFINITI 5FR ANG PIGTAIL (CATHETERS) ×2 IMPLANT
CATH INFINITI JR4 5F (CATHETERS) ×2 IMPLANT
CATH VISTA GUIDE 6FR XBLAD3.5 (CATHETERS) ×2 IMPLANT
DEVICE RAD COMP TR BAND LRG (VASCULAR PRODUCTS) ×2 IMPLANT
GLIDESHEATH SLEND SS 6F .021 (SHEATH) ×2 IMPLANT
KIT ENCORE 26 ADVANTAGE (KITS) ×2 IMPLANT
KIT HEART LEFT (KITS) ×2 IMPLANT
PACK CARDIAC CATHETERIZATION (CUSTOM PROCEDURE TRAY) ×2 IMPLANT
STENT SYNERGY DES 3.5X16 (Permanent Stent) ×2 IMPLANT
SYR MEDRAD MARK V 150ML (SYRINGE) ×2 IMPLANT
TRANSDUCER W/STOPCOCK (MISCELLANEOUS) ×2 IMPLANT
TUBING CIL FLEX 10 FLL-RA (TUBING) ×2 IMPLANT
WIRE HI TORQ BMW 190CM (WIRE) ×2 IMPLANT
WIRE SAFE-T 1.5MM-J .035X260CM (WIRE) ×2 IMPLANT

## 2015-12-23 NOTE — Interval H&P Note (Signed)
History and Physical Interval Note:  12/23/2015 8:33 AM  John Mcintyre  has presented today for cardiac cath with the diagnosis of unstable angina. The various methods of treatment have been discussed with the patient and family. After consideration of risks, benefits and other options for treatment, the patient has consented to  Procedure(s): Left Heart Cath and Coronary Angiography (N/A) as a surgical intervention .  The patient's history has been reviewed, patient examined, no change in status, stable for surgery.  I have reviewed the patient's chart and labs.  Questions were answered to the patient's satisfaction.    Cath Lab Visit (complete for each Cath Lab visit)  Clinical Evaluation Leading to the Procedure:   ACS: No.  Non-ACS:    Anginal Classification: CCS III  Anti-ischemic medical therapy: Minimal Therapy (1 class of medications)  Non-Invasive Test Results: No non-invasive testing performed  Prior CABG: No previous CABG         Verne Carrow

## 2015-12-23 NOTE — H&P (View-Only) (Signed)
CARDIOLOGY OFFICE NOTE  Date:  12/18/2015    John Mcintyre Date of Birth: 10/13/1970 Medical Record #[id number]  PCP:  Pcp Not In System  [facility]    Chief Complaint  Patient presents with  . Chest Pain    Work in visit - seen for Dr. Rennis GoldenHilty    History of Present Illness: John Mcintyre is a 45 y.o. male who presents today for a work in visit. Seen for Dr. Rennis GoldenHilty.   He has a history of HTN, HLD, tobacco abuse and prior polio with persistent weakness - requiring assistive devices to ambulate.  Not seen since June of 2016. Had had a Myoview - see below - elected to manage medically.   Phone call earlier this week - "Returned call to patient.He stated he has been having chest pain,tired ,no energy.Stated he would like to be seen this week.No chest pain at present.No appointments available with Dr.Hilty.Appointment scheduled with Norma FredricksonLori Twylah Bennetts NP 12/18/15 at 9:30 am [facility].Advised to go to ER if needed"  Thus added to my schedule for today.  Comes back today. Here alone. He notes that for the past several months he has had this "hard chest pain" - worse with minimal exertion. Radiates to his neck and back. Very tired. Cannot walk long distances or up stairs without having symptoms. No longer able to have sex due to chest pain.  This is getting worse. Was in the ER back in April - told it was musculoskeletal. Due to symptoms progressing - he called here. He continues to smoke about a pack a day - has done so for 25 years.   Past Medical History  Diagnosis Date  . Polio   . Hypertension   . Hypercholesteremia     Past Surgical History  Procedure Laterality Date  . Hip surgery Left 1987    for polio, not sure what was done  . Knee surgery Left 1987    for polio     Medications: Current Outpatient Prescriptions  Medication Sig Dispense Refill  . aspirin EC 81 MG tablet Take 81 mg by mouth daily.    . cyclobenzaprine (FLEXERIL) 10 MG tablet Take 1  tablet (10 mg total) by mouth 2 (two) times daily as needed for muscle spasms. 20 tablet 0  . hydrochlorothiazide (HYDRODIURIL) 25 MG tablet Take 25 mg by mouth daily.     [name]. lisinopril (PRINIVIL,ZESTRIL) 40 MG tablet Take 40 mg by mouth daily.      [name]. lovastatin (MEVACOR) 20 MG tablet TAKE 1 TABLET (20 MG TOTAL) BY MOUTH AT BEDTIME. 30 tablet 6  . omega-3 acid ethyl esters (LOVAZA) 1 g capsule Take 1 g by mouth 3 (three) times daily.    . metoprolol tartrate (LOPRESSOR) 25 MG tablet Take 1 tablet (25 mg total) by mouth 2 (two) times daily. 60 tablet 3  . nitroGLYCERIN (NITROSTAT) 0.4 MG SL tablet Place 1 tablet (0.4 mg total) under the tongue every 5 (five) minutes as needed for chest pain. 90 tablet 3   No current facility-administered medications for this visit.    Allergies: No Known Allergies  Social History: The patient  reports that he has been smoking Cigarettes.  He has been smoking about 1.00 pack per day. He has never used smokeless tobacco. He reports that he drinks alcohol. He reports that he does not use illicit drugs.   Family History: The patient's family history includes Heart failure in his father; Hypertension in his mother.  Review of Systems: Please see the history of present illness.   Otherwise, the review of systems is positive for none.   All other systems are reviewed and negative.   Physical Exam: VS:  BP 132/92 mmHg  Pulse 94  Ht  (1.651 m)  Wt 167 lb 12.8 oz (76.114 kg)  BMI 27.92 kg/m2 .  BMI Body mass index is 27.92 kg/(m^2).  Wt Readings from Last 3 Encounters:  12/18/15 167 lb 12.8 oz (76.114 kg)  09/17/15 160 lb (72.576 kg)  07/06/15 160 lb (72.576 kg)    General: Pleasant. Well developed, well nourished and in no acute distress.  HEENT: Normal except for poor dentition. Neck: Supple, no JVD, carotid bruits, or masses noted.  Cardiac: Regular rate and rhythm. No murmurs, rubs, or gallops. No edema.  Respiratory:  Lungs are clear to  auscultation bilaterally with normal work of breathing.  GI: Soft and nontender.  MS: No deformity or atrophy. Gait and ROM intact. Skin: Warm and dry. Color is normal.  Neuro:  Strength and sensation are intact and no gross focal deficits noted. But he does have a deformity of the left leg. He is using crutches.  Psych: Alert, appropriate and with normal affect.   LABORATORY DATA:  EKG:  EKG is ordered today. This demonstrates NSR with septal Q's  Lab Results  Component Value Date   WBC 14.3* 09/17/2015   HGB 15.2 09/17/2015   HCT 42.2 09/17/2015   PLT 230 09/17/2015   GLUCOSE 105* 09/17/2015   ALT 17 03/21/2013   AST 17 03/21/2013   NA 135 09/17/2015   K 4.0 09/17/2015   CL 100* 09/17/2015   CREATININE 0.75 09/17/2015   BUN 11 09/17/2015   CO2 25 09/17/2015   INR 1.0 05/02/2008    BNP (last 3 results) No results for input(s): BNP in the last 8760 hours.  ProBNP (last 3 results) No results for input(s): PROBNP in the last 8760 hours.   Other Studies Reviewed Today: Myoview Study Highlights from 09/2014    Mildly abnormal Lexiscan myoview with a small anterior non transmural scar with mild peri-infarct ischemia. The patient will follow up with Dr. Rennis Golden.      Assessment/Plan: 1. Chest pain - multiple CV risk factors - sounds like angina. Has had prior abnormal Myoview - cardiac catheterization recommended and discussed in depth with him. Adding Lopressor 25 mg BID and gave RX for NTG sl prn. He is asked to restrict his activities. The patient understands that risks include but are not limited to stroke (1 in 1000), death (1 in 1000), kidney failure [usually temporary] (1 in 500), bleeding (1 in 200), allergic reaction [possibly serious] (1 in 200), and agrees to proceed. He understands that he needs someone to drive him home (wife does not drive). 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   2. HTN - adding Lopressor today.   3. HLD - checking lab  today  4. Tobacco abuse - not ready to stop  Current medicines are reviewed with the patient today.  The patient does not have concerns regarding medicines other than what has been noted above.  The following changes have been made:  See above.  Labs/ tests ordered today include:    Orders Placed This Encounter  Procedures  . Basic metabolic panel  . CBC  . Protime-INR  . APTT  . EKG 12-Lead     Disposition:   Further disposition to follow. Cardiac cath arranged  for Monday with Dr. Clifton James due to patient's request. He is told multiple times to get a driver.    Patient is agreeable to this plan and will call if any problems develop in the interim.   Signed: Rosalio Macadamia, RN, ANP-C 12/18/2015 10:07 AM  [facility] [address] [place], [place]  [address] Phone: [phone] Fax: [phone]

## 2015-12-23 NOTE — Progress Notes (Signed)
TR BAND REMOVAL  LOCATION:    right radial  DEFLATED PER PROTOCOL:    Yes.    TIME BAND OFF / DRESSING APPLIED:   1400  SITE UPON ARRIVAL:    Level 0  SITE AFTER BAND REMOVAL:    Level 0  CIRCULATION SENSATION AND MOVEMENT:    Within Normal Limits   Yes.    COMMENTS:   Rechecked during shift with no change in assessment

## 2015-12-23 NOTE — Care Management Note (Signed)
Case Management Note  Patient Details  Name: John Mcintyre MRN: [id number] Date of Birth: 1971-02-15  Subjective/Objective:     Patient is from home, s/p stent intervention, NCM will cont to follow for dc needs.               Action/Plan:   Expected Discharge Date:                  Expected Discharge Plan:  Home/Self Care  In-House Referral:     Discharge planning Services  CM Consult  Post Acute Care Choice:    Choice offered to:     DME Arranged:    DME Agency:     HH Arranged:    HH Agency:     Status of Service:  In process, will continue to follow  If discussed at Long Length of Stay Meetings, dates discussed:    Additional Comments:  Leone Haven, RN 12/23/2015, 1:13 PM

## 2015-12-24 ENCOUNTER — Telehealth: Payer: Self-pay | Admitting: Internal Medicine

## 2015-12-24 DIAGNOSIS — I2510 Atherosclerotic heart disease of native coronary artery without angina pectoris: Secondary | ICD-10-CM | POA: Diagnosis not present

## 2015-12-24 DIAGNOSIS — I25110 Atherosclerotic heart disease of native coronary artery with unstable angina pectoris: Secondary | ICD-10-CM | POA: Diagnosis not present

## 2015-12-24 DIAGNOSIS — Z9861 Coronary angioplasty status: Secondary | ICD-10-CM

## 2015-12-24 DIAGNOSIS — I200 Unstable angina: Secondary | ICD-10-CM

## 2015-12-24 DIAGNOSIS — E7800 Pure hypercholesterolemia, unspecified: Secondary | ICD-10-CM | POA: Diagnosis not present

## 2015-12-24 DIAGNOSIS — A809 Acute poliomyelitis, unspecified: Secondary | ICD-10-CM

## 2015-12-24 DIAGNOSIS — E785 Hyperlipidemia, unspecified: Secondary | ICD-10-CM | POA: Diagnosis not present

## 2015-12-24 DIAGNOSIS — I10 Essential (primary) hypertension: Secondary | ICD-10-CM

## 2015-12-24 DIAGNOSIS — I2582 Chronic total occlusion of coronary artery: Secondary | ICD-10-CM | POA: Diagnosis not present

## 2015-12-24 LAB — CBC
HCT: 41.1 % (ref 39.0–52.0)
Hemoglobin: 13.5 g/dL (ref 13.0–17.0)
MCH: 29 pg (ref 26.0–34.0)
MCHC: 32.8 g/dL (ref 30.0–36.0)
MCV: 88.4 fL (ref 78.0–100.0)
PLATELETS: 233 10*3/uL (ref 150–400)
RBC: 4.65 MIL/uL (ref 4.22–5.81)
RDW: 13.2 % (ref 11.5–15.5)
WBC: 10.4 10*3/uL (ref 4.0–10.5)

## 2015-12-24 LAB — BASIC METABOLIC PANEL
Anion gap: 8 (ref 5–15)
BUN: 6 mg/dL (ref 6–20)
CALCIUM: 9.1 mg/dL (ref 8.9–10.3)
CHLORIDE: 104 mmol/L (ref 101–111)
CO2: 24 mmol/L (ref 22–32)
CREATININE: 0.6 mg/dL — AB (ref 0.61–1.24)
GFR calc non Af Amer: >60 mL/min (ref >60)
Glucose, Bld: 118 mg/dL — ABNORMAL HIGH (ref 65–99)
Potassium: 3.3 mmol/L — ABNORMAL LOW (ref 3.5–5.1)
Sodium: 136 mmol/L (ref 135–145)

## 2015-12-24 MED ORDER — POTASSIUM CHLORIDE CRYS ER 20 MEQ PO TBCR
40.0000 meq | EXTENDED_RELEASE_TABLET | Freq: Once | ORAL | Status: AC
  Administered 2015-12-24: 40 meq via ORAL
  Filled 2015-12-24: qty 2

## 2015-12-24 MED ORDER — NICOTINE 7 MG/24HR TD PT24: 7.0000 mg | MEDICATED_PATCH | Freq: Every day | TRANSDERMAL | 0 refills | Status: DC

## 2015-12-24 MED ORDER — CLOPIDOGREL BISULFATE 75 MG PO TABS: 75.0000 mg | ORAL_TABLET | Freq: Every day | ORAL | 3 refills | Status: DC

## 2015-12-24 MED ORDER — LISINOPRIL 5 MG PO TABS: 5.0000 mg | ORAL_TABLET | Freq: Every day | ORAL | 3 refills | Status: DC

## 2015-12-24 MED ORDER — ACETAMINOPHEN 325 MG PO TABS: 650.0000 mg | ORAL_TABLET | ORAL | Status: DC | PRN

## 2015-12-24 MED ORDER — LISINOPRIL 5 MG PO TABS
5.0000 mg | ORAL_TABLET | Freq: Every day | ORAL | Status: DC
  Administered 2015-12-24: 12:00:00 5 mg via ORAL
  Filled 2015-12-24: qty 1

## 2015-12-24 MED FILL — Verapamil HCl IV Soln 2.5 MG/ML: INTRAVENOUS | Qty: 2 | Status: AC

## 2015-12-24 MED FILL — Nitroglycerin IV Soln 100 MCG/ML in D5W: INTRA_ARTERIAL | Qty: 10 | Status: AC

## 2015-12-24 NOTE — Progress Notes (Signed)
Subjective:  No chest pain  Objective:  Vital Signs in the last 24 hours: Temp:  [97.1 F (36.2 C)-97.9 F (36.6 C)] 97.1 F (36.2 C) (07/25 0752) Pulse Rate:  [0-96] 79 (07/25 0752) Resp:  [4-23] 18 (07/25 0752) BP: (93-139)/(47-90) 117/77 (07/25 0752) SpO2:  [0 %-100 %] 99 % (07/25 0752) Weight:  [160 lb 15 oz (73 kg)] 160 lb 15 oz (73 kg) (07/25 0646)  Intake/Output from previous day:  Intake/Output Summary (Last 24 hours) at 12/24/15 0812 Last data filed at 12/24/15 0103  Gross per 24 hour  Intake             1410 ml  Output              400 ml  Net             1010 ml    Physical Exam: General appearance: alert, cooperative and no distress Neck: no carotid bruit and no JVD Lungs: clear to auscultation bilaterally Heart: regular rate and rhythm Extremities: Rt RA site without hematoma Skin: Skin color, texture, turgor normal. No rashes or lesions Neurologic: Grossly normal   Rate: 78  Rhythm: normal sinus rhythm  Lab Results:  Recent Labs  12/24/15 0322  WBC 10.4  HGB 13.5  PLT 233    Recent Labs  12/24/15 0322  NA 136  K 3.3*  CL 104  CO2 24  GLUCOSE 118*  BUN 6  CREATININE 0.60*   No results for input(s): TROPONINI in the last 72 hours.  Invalid input(s): CK, MB No results for input(s): INR in the last 72 hours.  Scheduled Meds: . aspirin EC  81 mg Oral Daily  . clopidogrel  75 mg Oral Q breakfast  . metoprolol tartrate  25 mg Oral BID  . nicotine  7 mg Transdermal Daily  . pneumococcal 23 valent vaccine  0.5 mL Intramuscular Tomorrow-1000  . potassium chloride  40 mEq Oral Once  . pravastatin  80 mg Oral q1800  . sodium chloride flush  3 mL Intravenous Q12H   Continuous Infusions:  PRN Meds:.sodium chloride, acetaminophen, cyclobenzaprine, nitroGLYCERIN, ondansetron (ZOFRAN) IV, sodium chloride flush   Imaging: Imaging results have been reviewed   Assessment/Plan:   45 y.o. 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 He saw Dr Rennis Golden in may 2016 for chest pain. Myoview then low risk with a small ant/apical defect. He was treated medically and did well until recently when he called complaining of exertional chest tightness, bilateral arm pain, and dyspnea. He was seen in the office and set up for coronary angiogram which was done 7/224/17.  This revealed CTO of the RCA with L-R collaterals from the CFX, 40% CFX, and 95% proximal LAD-treated with a DES. EF 45-50%.   Principal Problem:   Unstable angina [facility]) Active Problems:   CAD S/P LAD DES 12/23/15   Polio   Essential hypertension   Hypercholesteremia   Smoker   PLAN: Long discussion about smoking cessation. His Lipid panel is actually pretty good on his current OP statin. He is on Medicaid, not sure he can afford a different statin. DC today, f/u 1-2 wks. Ambulating with crutches after a radial cath may be an issue-will discuss with MD.   His B/P has been low and he has not been on Lisinopril or HCTZ since admission- consider holding HCTZ and Lisinopril till seen as OP.   K+ this am  Corine Shelter PA-C 12/24/2015,  8:12 AM [phone]

## 2015-12-24 NOTE — Discharge Summary (Signed)
Discharge Summary    Patient ID: John Mcintyre,  MRN: [id number], DOB/AGE: 12/17/1970 45 y.o.  Admit date: 12/23/2015 Discharge date: 12/24/2015  Primary Care Provider: Pcp Not In System Primary Cardiologist: Dr Rennis Golden  Discharge Diagnoses    Principal Problem:   Unstable angina [facility]) Active Problems:   CAD S/P LAD DES 12/23/15   Polio   Essential hypertension   Hypercholesteremia   Smoker   Allergies Allergies  Allergen Reactions  . 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 He saw Dr Rennis Golden in may 2016 for chest pain. Myoview then low risk with a small ant/apical defect. He was treated medically and did well until recently when he called complaining of exertional chest tightness, bilateral arm pain, and dyspnea. He was seen in the office 12/18/15 and set up for coronary angiogram which was done 12/23/15. This was done via Rt RA approach and revealed a CTO of the RCA with L-R collaterals from the CFX, a 40% CFX, and a 95% proximal LAD-treated with a DES. His EF was 45-50%. He tolerated this well and was seen by Dr Rennis Golden the morning of 12/23/15. He is stable for discharge. He will be instructed to try and not use Rt arm crutch for 24 hrs (he has polio and uses crutches). He was noted to be mildly hypotensive and it was decided to stop his HCTZ and Lisinopril 40 mg and discharge him on Lisinopril 5 mg. Both Dr Rennis Golden and myself discussed smoking cessation but the patient was only partially receptive to this- promising to cut back.   _____________  Discharge Vitals Blood pressure 117/77, pulse 79, temperature 97.1 F (36.2 C), temperature source Axillary, resp. rate 18, height 5\' 5"  (1.651 m), weight 160 lb 15 oz (73 kg), SpO2 99  %.  Filed Weights   12/23/15 0706 12/24/15 0646  Weight: 165 lb (74.8 kg) 160 lb 15 oz (73 kg)    Labs & Radiologic Studies    CBC  Recent Labs  12/24/15 0322  WBC 10.4  HGB 13.5  HCT 41.1  MCV 88.4  PLT 233   Basic Metabolic Panel  Recent Labs  12/24/15 0322  NA 136  K 3.3*  CL 104  CO2 24  GLUCOSE 118*  BUN 6  CREATININE 0.60*  CALCIUM 9.1   Liver Function Tests No results for input(s): AST, ALT, ALKPHOS, BILITOT, PROT, ALBUMIN in the last 72 hours. No results for input(s): LIPASE, AMYLASE in the last 72 hours. Cardiac Enzymes No results for input(s): CKTOTAL, CKMB, CKMBINDEX, TROPONINI in the last 72 hours. BNP Invalid input(s): POCBNP D-Dimer No results for input(s): DDIMER in the last 72 hours. Hemoglobin A1C No results for input(s): HGBA1C in the last 72 hours. Fasting Lipid Panel No results for input(s): CHOL, HDL, LDLCALC, TRIG, CHOLHDL, LDLDIRECT in the last 72 hours. Thyroid Function Tests No results for input(s): TSH, T4TOTAL, T3FREE, THYROIDAB in the last 72 hours.  Invalid input(s): FREET3 _____________  No results found. Disposition   Pt is being discharged home today in good condition.  Follow-up Plans & Appointments    Follow-up Information    Chrystie Nose, MD .   Specialty:  Cardiology Why:  office will contact you Contact information: [address] [place] [address] [phone]            Discharge Medications   Current Discharge Medication List    START taking these medications   Details  acetaminophen (TYLENOL) 325 MG tablet Take 2 tablets (650 mg total) by mouth every 4 (four) hours as needed for headache or mild pain.    clopidogrel (PLAVIX) 75 MG tablet Take 1 tablet (75 mg total) by mouth daily with breakfast. Qty: 90 tablet, Refills: 3    nicotine (NICODERM CQ - DOSED IN MG/24 HR) 7 mg/24hr patch Place 1 patch (7 mg total) onto the skin daily. Qty: 28 patch, Refills: 0        CONTINUE these medications which have CHANGED   Details  lisinopril (PRINIVIL,ZESTRIL) 5 MG tablet Take 1 tablet (5 mg total) by mouth daily. Qty: 90 tablet, Refills: 3      CONTINUE these medications which have NOT CHANGED   Details  aspirin EC 81 MG tablet Take 81 mg by mouth daily.    lovastatin (MEVACOR) 20 MG tablet TAKE 1 TABLET (20 MG TOTAL) BY MOUTH AT BEDTIME. Qty: 30 tablet, Refills: 6    metoprolol tartrate (LOPRESSOR) 25 MG tablet Take 1 tablet (25 mg total) by mouth 2 (two) times daily. Qty: 60 tablet, Refills: 3    nitroGLYCERIN (NITROSTAT) 0.4 MG SL tablet Place 1 tablet (0.4 mg total) under the tongue every 5 (five) minutes as needed for chest pain. Qty: 90 tablet, Refills: 3    omega-3 acid ethyl esters (LOVAZA) 1 g capsule Take 1 g by mouth 3 (three) times daily.    cyclobenzaprine (FLEXERIL) 10 MG tablet Take 1 tablet (10 mg total) by mouth 2 (two) times daily as needed for muscle spasms. Qty: 20 tablet, Refills: 0      STOP taking these medications     hydrochlorothiazide (HYDRODIURIL) 25 MG tablet          Aspirin prescribed at discharge?  Yes High Intensity Statin Prescribed? (Lipitor 40-80mg  or Crestor 20-40mg ): Yes Beta Blocker Prescribed? Yes For EF <40%, was ACEI/ARB Prescribed? No: NA ADP Receptor Inhibitor Prescribed? (i.e. Plavix etc.-Includes Medically Managed Patients): Yes For EF <40%, Aldosterone Inhibitor Prescribed? No: NA Was EF assessed during THIS hospitalization? Yes Was Cardiac Rehab II ordered? (Included Medically managed Patients): Yes   Outstanding Labs/Studies   None  Duration of Discharge Encounter   Greater than 30 minutes including physician time.  John Provost PA 12/24/2015, 10:29 AM

## 2015-12-24 NOTE — Care Management Note (Signed)
Case Management Note  Patient Details  Name: Hazael Ramrattan MRN: [id number] Date of Birth: May 09, 1971  Subjective/Objective:       Patient is for dc today, on plavix, no needs.             Action/Plan:   Expected Discharge Date:                  Expected Discharge Plan:  Home/Self Care  In-House Referral:     Discharge planning Services  CM Consult  Post Acute Care Choice:    Choice offered to:     DME Arranged:    DME Agency:     HH Arranged:    HH Agency:     Status of Service:  Completed, signed off  If discussed at [employer] of Stay Meetings, dates discussed:    Additional Comments:  Leone Haven, RN 12/24/2015, 11:47 AM

## 2015-12-24 NOTE — Progress Notes (Signed)
Did not ambulate pt due to his need for crutches and wrist restrictions. He did demonstrate how he can put his right arm around his wife's shoulder to limit resistance on right wrist. Pt has limited ambulation in general. Ed completed. He has a plan to cut down on smoking then quit with nicotine gum. He was not very receptive to any other tips or advice. Gave him the fake cigarette. Reinforced Plavix/ASA. Discussed CRPII and he believes he could do two or three of the machines and would be happy to get some exercise since he has limited leg mobility. He would like to see the facility today. Will send referral to [facility]. Did not give walking gl. [phone] Ethelda Chick CES, ACSM 10:41 AM 12/24/2015

## 2015-12-30 ENCOUNTER — Emergency Department (HOSPITAL_COMMUNITY): Payer: Medicaid Other

## 2015-12-30 ENCOUNTER — Emergency Department (HOSPITAL_COMMUNITY)
Admission: EM | Admit: 2015-12-30 | Discharge: 2015-12-31 | Disposition: A | Discharge status: Home / Self Care | Payer: Medicaid Other | Attending: Emergency Medicine | Admitting: Emergency Medicine

## 2015-12-30 ENCOUNTER — Encounter (HOSPITAL_COMMUNITY): Payer: Self-pay | Admitting: Emergency Medicine

## 2015-12-30 DIAGNOSIS — Z9861 Coronary angioplasty status: Secondary | ICD-10-CM | POA: Diagnosis not present

## 2015-12-30 DIAGNOSIS — I2510 Atherosclerotic heart disease of native coronary artery without angina pectoris: Secondary | ICD-10-CM | POA: Insufficient documentation

## 2015-12-30 DIAGNOSIS — F17210 Nicotine dependence, cigarettes, uncomplicated: Secondary | ICD-10-CM | POA: Diagnosis not present

## 2015-12-30 DIAGNOSIS — R1013 Epigastric pain: Secondary | ICD-10-CM

## 2015-12-30 DIAGNOSIS — Z7982 Long term (current) use of aspirin: Secondary | ICD-10-CM | POA: Diagnosis not present

## 2015-12-30 DIAGNOSIS — I10 Essential (primary) hypertension: Secondary | ICD-10-CM | POA: Insufficient documentation

## 2015-12-30 DIAGNOSIS — Z79899 Other long term (current) drug therapy: Secondary | ICD-10-CM | POA: Insufficient documentation

## 2015-12-30 DIAGNOSIS — R079 Chest pain, unspecified: Secondary | ICD-10-CM | POA: Diagnosis present

## 2015-12-30 NOTE — ED Triage Notes (Signed)
Pt states that he had a heart cath last week and is now having L sided chest pain. Alert and oriented.

## 2015-12-30 NOTE — Telephone Encounter (Signed)
Closed encounter °

## 2015-12-31 LAB — BASIC METABOLIC PANEL
Anion gap: 7 (ref 5–15)
BUN: 11 mg/dL (ref 6–20)
CHLORIDE: 108 mmol/L (ref 101–111)
CO2: 23 mmol/L (ref 22–32)
CREATININE: 0.68 mg/dL (ref 0.61–1.24)
Calcium: 9.2 mg/dL (ref 8.9–10.3)
GFR calc non Af Amer: >60 mL/min (ref >60)
Glucose, Bld: 116 mg/dL — ABNORMAL HIGH (ref 65–99)
Potassium: 3.5 mmol/L (ref 3.5–5.1)
Sodium: 138 mmol/L (ref 135–145)

## 2015-12-31 LAB — I-STAT TROPONIN, ED
TROPONIN I, POC: 0 ng/mL (ref 0.00–0.08)
Troponin i, poc: 0 ng/mL (ref 0.00–0.08)

## 2015-12-31 LAB — CBC
HEMATOCRIT: 39.9 % (ref 39.0–52.0)
Hemoglobin: 13.8 g/dL (ref 13.0–17.0)
MCH: 30.3 pg (ref 26.0–34.0)
MCHC: 34.6 g/dL (ref 30.0–36.0)
MCV: 87.7 fL (ref 78.0–100.0)
PLATELETS: 267 10*3/uL (ref 150–400)
RBC: 4.55 MIL/uL (ref 4.22–5.81)
RDW: 13.3 % (ref 11.5–15.5)
WBC: 12 10*3/uL — ABNORMAL HIGH (ref 4.0–10.5)

## 2015-12-31 MED ORDER — PANTOPRAZOLE SODIUM 40 MG PO TBEC: 40.0000 mg | DELAYED_RELEASE_TABLET | Freq: Every day | ORAL | 0 refills | Status: DC

## 2015-12-31 MED ORDER — PANTOPRAZOLE SODIUM 40 MG PO TBEC
40.0000 mg | DELAYED_RELEASE_TABLET | Freq: Once | ORAL | Status: AC
  Administered 2015-12-31: 40 mg via ORAL
  Filled 2015-12-31: qty 1

## 2015-12-31 MED ORDER — GI COCKTAIL ~~LOC~~
30.0000 mL | Freq: Once | ORAL | Status: AC
  Administered 2015-12-31: 30 mL via ORAL
  Filled 2015-12-31: qty 30

## 2015-12-31 NOTE — ED Provider Notes (Signed)
[facility]-EMERGENCY DEPT Provider Note   CSN: [id number] Arrival date & time: 12/30/15  2329  First Provider Contact:  First MD Initiated Contact with Patient 12/31/15 0030   By signing my name below, I, John Mcintyre, attest that this documentation has been prepared under the direction and in the presence of John Booze, MD. Electronically Signed: Bridgette Mcintyre, ED Scribe. 12/31/15. 12:41 AM.  History   Chief Complaint Chief Complaint  Patient presents with  . Chest Pain   HPI Comments: John Mcintyre is a 45 y.o. male with h/o CAD and HTN who presents to the Emergency Department complaining of sudden onset, constant, aching, 3/10 (prior to evaluation, it was 7/10) left sided chest pain radiating to the back onset at 10 pm today. Pt states the pain came on when he was arguing with his children. Pt states pain is exacerbated when he is ambulating and outside. Pt notes pain improves when he is sitting up. Pt took a NTG pill with mild relief. Pt had a heart stent put in one week ago. Pt notes his h/o chest pain feels different from the pain present at this time, he states this pain is less severe. Pt notes the pain has significantly improved at this time. Pt is a smoker. Denies shortness of breath, diaphoresis, nausea, or any other associated symptoms.   The history is provided by the patient. No language interpreter was used.   Past Medical History:  Diagnosis Date  . Coronary artery disease   . Hypercholesteremia   . Hypertension   . Polio    Patient Active Problem List   Diagnosis Date Noted  . CAD S/P LAD DES 12/23/15 12/23/2015  . Smoker 12/23/2015  . Unstable angina (HCC)   . Essential hypertension 10/11/2014  . Hypercholesteremia 10/11/2014  . Chest pain 10/11/2014  . Polio    Past Surgical History:  Procedure Laterality Date  . CARDIAC CATHETERIZATION N/A 12/23/2015   Procedure: Left Heart Cath and Coronary Angiography;  Surgeon: Kathleene Hazel, MD;  Location: [facility] INVASIVE CV  LAB;  Service: Cardiovascular;  Laterality: N/A;  . CARDIAC CATHETERIZATION N/A 12/23/2015   Procedure: Coronary Stent Intervention;  Surgeon: Kathleene Hazel, MD;  Location: [facility] INVASIVE CV LAB;  Service: Cardiovascular;  Laterality: N/A;  . CORONARY STENT PLACEMENT  12/23/2015   Successful PTCA/DES x 1 proximal LAD  . HIP SURGERY Left 1987   for polio, not sure what was done  . KNEE SURGERY Left 1987   for polio     Home Medications    Prior to Admission medications   Medication Sig Start Date End Date Taking? Authorizing Provider  acetaminophen (TYLENOL) 325 MG tablet Take 2 tablets (650 mg total) by mouth every 4 (four) hours as needed for headache or mild pain. 12/24/15  Yes Abelino Derrick, PA-C  aspirin EC 81 MG tablet Take 81 mg by mouth daily.   Yes Historical Provider, MD  clopidogrel (PLAVIX) 75 MG tablet Take 1 tablet (75 mg total) by mouth daily with breakfast. 12/24/15  Yes Abelino Derrick, PA-C  cyclobenzaprine (FLEXERIL) 10 MG tablet Take 1 tablet (10 mg total) by mouth 2 (two) times daily as needed for muscle spasms. 09/17/15  Yes Samantha Tripp Dowless, PA-C  lisinopril (PRINIVIL,ZESTRIL) 5 MG tablet Take 1 tablet (5 mg total) by mouth daily. 12/24/15  Yes Luke K Kilroy, PA-C  lovastatin (MEVACOR) 20 MG tablet TAKE 1 TABLET (20 MG TOTAL) BY MOUTH AT BEDTIME. 11/08/15  Yes Chrystie Nose, MD  metoprolol tartrate (LOPRESSOR) 25 MG tablet Take 1 tablet (25 mg total) by mouth 2 (two) times daily. 12/18/15  Yes Rosalio Macadamia, NP  nitroGLYCERIN (NITROSTAT) 0.4 MG SL tablet Place 1 tablet (0.4 mg total) under the tongue every 5 (five) minutes as needed for chest pain. 12/18/15  Yes Rosalio Macadamia, NP  omega-3 acid ethyl esters (LOVAZA) 1 g capsule Take 1 g by mouth 3 (three) times daily.   Yes Historical Provider, MD  varenicline (CHANTIX PAK) 0.5 MG X 11 & 1 MG X 42 tablet Take 1 mg by mouth 2 (two) times daily. Take one 0.5 mg tablet by mouth once daily for 3 days, then increase to  one 0.5 mg tablet twice daily for 4 days, then increase to one 1 mg tablet twice daily.   Yes Historical Provider, MD  nicotine (NICODERM CQ - DOSED IN MG/24 HR) 7 mg/24hr patch Place 1 patch (7 mg total) onto the skin daily. Patient not taking: Reported on 12/30/2015 12/24/15   Abelino Derrick, PA-C   Family History Family History  Problem Relation Age of Onset  . Hypertension Mother   . Heart failure Father    Social History Social History  Substance Use Topics  . Smoking status: Current Every Day Smoker    Packs/day: 1.00    Years: 30.00    Types: Cigarettes  . Smokeless tobacco: Never Used  . Alcohol use Yes     Comment: occ   Allergies   Pork-derived products Review of Systems Review of Systems  Constitutional: Negative for diaphoresis and fever.  Respiratory: Negative for shortness of breath.   Cardiovascular: Positive for chest pain.  Gastrointestinal: Negative for nausea.  All other systems reviewed and are negative.  Physical Exam Updated Vital Signs BP 118/76 (BP Location: Left Arm)   Pulse 81   Temp 98.2 F (36.8 C) (Oral)   Resp 19   SpO2 97%   Physical Exam  Constitutional: He is oriented to person, place, and time. He appears well-developed and well-nourished.  HENT:  Head: Normocephalic and atraumatic.  Eyes: EOM are normal. Pupils are equal, round, and reactive to light.  Neck: Normal range of motion. Neck supple. No JVD present.  Cardiovascular: Normal rate, regular rhythm and normal heart sounds.   No murmur heard. Pulmonary/Chest: Effort normal and breath sounds normal. He has no wheezes. He has no rales. He exhibits no tenderness.  Abdominal: Soft. He exhibits no distension and no mass. There is tenderness. There is no rebound and no guarding.  Mild epigastric tenderness.  Musculoskeletal: Normal range of motion. He exhibits no edema.  Lymphadenopathy:    He has no cervical adenopathy.  Neurological: He is alert and oriented to person, place, and  time. No cranial nerve deficit. He exhibits normal muscle tone. Coordination normal.  Skin: Skin is warm and dry. No rash noted.  Psychiatric: He has a normal mood and affect. His behavior is normal. Judgment and thought content normal.  Nursing note and vitals reviewed.  ED Treatments / Results  DIAGNOSTIC STUDIES: Oxygen Saturation is 97% on RA, adequate by my interpretation.    COORDINATION OF CARE: 12:33 AM Discussed treatment plan with pt at bedside which includes cardiac monitoring and repeat bloodwork and pt agreed to plan.  Labs (all labs ordered are listed, but only abnormal results are displayed) Labs Reviewed  BASIC METABOLIC PANEL - Abnormal; Notable for the following:       Result Value   Glucose, Bld 116 (*)  All other components within normal limits  CBC - Abnormal; Notable for the following:    WBC 12.0 (*)    All other components within normal limits  I-STAT TROPOININ, ED  I-STAT TROPOININ, ED   EKG  EKG Interpretation  Date/Time:  Monday December 30 2015 23:52:48 EDT Ventricular Rate:  85 PR Interval:    QRS Duration: 97 QT Interval:  354 QTC Calculation: 421 R Axis:   77 Text Interpretation:  Sinus rhythm Anteroseptal infarct, age indeterminate ST elevation, consider inferior injury No STEMI. Similar to 12/23/15 tracing Confirmed by LONG MD, JOSHUA [phone]) on 12/31/2015 12:02:41 AM      Radiology Dg Chest 2 View  Result Date: 12/31/2015 CLINICAL DATA:  Sudden onset of left-sided chest pain radiating to the left arm for several hours. Recent cardiac catheterization. EXAM: CHEST  2 VIEW COMPARISON:  Radiographs and CT 09/17/2015 FINDINGS: The cardiomediastinal contours are normal. Coronary stent in place. The lungs are clear. Pulmonary vasculature is normal. No consolidation, pleural effusion, or pneumothorax. No acute osseous abnormalities are seen. IMPRESSION: No active cardiopulmonary disease. Electronically Signed   By: Rubye Oaks M.D.   On: 12/31/2015  00:11   ProcedurChest pain which is actually more epigastric es Procedures (including critical care time)  Medications Ordered in ED Medications  gi cocktail (Maalox,Lidocaine,Donnatal) (30 mLs Oral Given 12/31/15 0114)  pantoprazole (PROTONIX) EC tablet 40 mg (40 mg Oral Given 12/31/15 0113)    Initial Impression / Assessment and Plan / ED Course  I have reviewed the triage vital signs and the nursing notes.  Pertinent labs & imaging results that were available during my care of the patient were reviewed by me and considered in my medical decision making (see chart for details).  Clinical Course    Epigastric pain of 10 tenderness. Old records are reviewed, and patient had been admitted to the hospital within the last week and had stent placed in the LAD. Residual 40% stenosis in the circumflex was noted. Right coronary artery was completely occluded and fed by collaterals. Pain today does not appear to be cardiac. He is given a GI cocktail with excellent relief of symptoms. He was also given oral pantoprazole. Screening labs are unremarkable including normal troponin. He was kept in the ED for a delta troponin which was also normal. He is discharged with a prescription for pantoprazole.  Final Clinical Impressions(s) / ED Diagnoses   Final diagnoses:  None   New Prescriptions New Prescriptions   No medications on file  I personally performed the services described in this documentation, which was scribed in my presence. The recorded information has been reviewed and is accurate.       John Booze, MD 12/31/15 [phone]

## 2015-12-31 NOTE — ED Notes (Addendum)
Pt stated "I had heart cath last week."  Dr. Preston Fleeting in to room.

## 2016-01-24 ENCOUNTER — Ambulatory Visit (INDEPENDENT_AMBULATORY_CARE_PROVIDER_SITE_OTHER): Payer: Medicaid Other | Admitting: Cardiology

## 2016-01-24 ENCOUNTER — Encounter: Payer: Self-pay | Admitting: Cardiology

## 2016-01-24 VITALS — BP 132/88 | HR 83 | Ht 65.0 in | Wt 170.4 lb

## 2016-01-24 DIAGNOSIS — I10 Essential (primary) hypertension: Secondary | ICD-10-CM

## 2016-01-24 DIAGNOSIS — R0789 Other chest pain: Secondary | ICD-10-CM

## 2016-01-24 DIAGNOSIS — I2510 Atherosclerotic heart disease of native coronary artery without angina pectoris: Secondary | ICD-10-CM

## 2016-01-24 DIAGNOSIS — E7800 Pure hypercholesterolemia, unspecified: Secondary | ICD-10-CM

## 2016-01-24 DIAGNOSIS — Z9861 Coronary angioplasty status: Secondary | ICD-10-CM

## 2016-01-24 DIAGNOSIS — F17200 Nicotine dependence, unspecified, uncomplicated: Secondary | ICD-10-CM

## 2016-01-24 DIAGNOSIS — A809 Acute poliomyelitis, unspecified: Secondary | ICD-10-CM

## 2016-01-24 DIAGNOSIS — Z720 Tobacco use: Secondary | ICD-10-CM

## 2016-01-24 MED ORDER — LISINOPRIL 10 MG PO TABS: 10.0000 mg | ORAL_TABLET | Freq: Every day | ORAL | 0 refills | Status: DC

## 2016-01-24 NOTE — Patient Instructions (Signed)
Medications  Increase Lisinopril to 10mg  daily.   Follow-Up  Your physician recommends that you schedule a follow-up appointment in: 3 months with Dr. Rennis Golden.  If you need a refill on your cardiac medications before your next appointment, please call your pharmacy.  _________________________________________________________________________________________________   What Is CoQ10? The name may not sound very natural, but CoQ10 is in fact an essential nutrient that works like an antioxidant in the body. In its active form, it's called ubiquinone or ubiquinol. It's synthesized within the body naturally and used for important functions, such as supplying cells with energy, transporting electrons and regulating blood pressure levels. (1) The reason it's not considered to be a "vitamin" is because all animals, including humans, can make small amounts of coenzymes on their own even without the help of food.  How CoQ10 Works: To sustain enough energy to perform bodily functions, inside our cells tiny organelles called mitochondria take fat and other nutrients and turn them into useable sources of energy. This conversion process requires the presence of CoQ10.  As a "coenzyme," CoQ10 also helps other enzymes work to digest food properly.  CoQ10 is not only necessary for producing cellular energy, but also for defending cells from damage caused by harmful free radicals.  Coenzyme Q10 can exist in three different oxidation states, and the ability in some forms to accept and donate electrons is a critical feature in its biochemical functions that cancel out free radical damage.  As a powerful antioxidant, CoQ10 can increase absorption of other essential nutrients. It's been shown that it helps recycle vitamin C and vitamin E, further maximizing the effects of vitamins and antioxidants that are already at work in the body.  Although the body has the ability to make some CoQ10 on its own, production naturally  declines as we age - just when we need our cells to help defend [name] most. This means we can all benefit from consuming more CoQ10, both naturally within from our diets, and also from high-quality supplements.   Who Should Take CoQ10? According to work done by [employer], natural synthesis of CoQ10, plus dietary intake, appears to provide sufficient amounts to help prevent deficiency in healthy people - however as explained above, the body produces less CoQ10 as someone gets older. (2) The natural ability to convert CoQ10 into its active form called ubiquinol declines during the aging process. This decline is most apparent in people over the age of 80, particularly those taking statin drugs. It's also been found that people with diabetes, cancer and congestive heart failure tend to have decreased plasma levels of coenzyme Q10. For these reasons, CoQ10 is recommended most for people with heart problems. This can include anyone suffering from: A history of heart attacks or coronary heart disease  High cholesterol (especially when taking statin drugs!)  High blood pressure  Atherosclerosis  Angina  Mitral valve prolapse In addition to supporting a healthy cardiovascular system, CoQ10 has also been found to have the following benefits: Helps lower fatigue and boosts stamina  Defends against free radicals and typical signs of aging, including muscle loss and skin changes  Restores the power of antioxidants, including vitamin E and vitamin C  Stabilizes blood sugar  Supports healthy gums  Reduces muscular dystrophy  Helps treat cognitive disorders, including Parkinson's disease and Alzheimer's  Results in metabolic improvement in patients with hereditary mitochondrial disorders  May be able to help treat other conditions, including cancer, hormone imbalances, diabetes, viruses and infections   6  Benefits of CoQ10 1. Sustains Natural Energy CoQ10 plays a role in "mitochondrial ATP  synthesis," which is the conversion of raw energy from foods (carbohydrates and fats) into the form of energy that our cells use called adenosine triphosphate (ATP). This conversion process requires the presence of coenzyme Q in the inner mitochondrial membrane. One of its roles is to accept electrons during fatty acid and glucose metabolism and then transfer them to electron acceptors. (3) The process of making ATP has many benefits, from preserving muscle mass to helping regulate appetite and body weight. 2. Reduces Free Radical Damage  Oxidative damage (or free radical damage) of cell structures plays an important role in the functional declines that accompany aging and cause disease. As a fat-soluble antioxidant, CoQ10 has been found to inhibit lipid peroxidation, which occurs when cell membranes and low-density lipoproteins are exposed to oxidizing conditions that enter from outside the body. In fact, when LDL is oxidized, CoQ10 is one of the first antioxidants consumed to help offset the effects. Within mitochondria, coenzyme Q10 has been found to protect membrane proteins and DNA from the oxidative damage that accompanies lipid peroxidation and neutralize free radicals directly that contribute to nearly all age-related diseases (heart disease, cancer, diabetes, etc.). 3. Can Improve Heart Health and Offset Effects of Statin Drugs Although experts feel that additional well-controlled clinical trials are still needed to prove its effects, CoQ10 has strong potential for prevention and treatment of heart ailments by improving cellular bioenergetics, acting as an antioxidant and boosting free radical-scavenging abilities. A 2015 report published in [employer] in [place] referenced earlier stated that "CoQ10 deficiencies are due to autosomal recessive mutations, mitochondrial diseases, aging-related oxidative stress and carcinogenesis processes, and also a secondary effect of statin treatment." What we do  know is that CoQ10 supplementation seems to be useful for those taking statins, since it lowers side effects that they often cause. Statins are used to reduce an enzyme in the liver that not only decreases the production of cholesterol, but also further lowers the natural production of CoQ10. It's now widely accepted that CoQ10 can interact with lipid lowering medications that inhibit the activity of HMG-CoA reductase, a critical enzyme in both cholesterol and coenzyme Q10 biosynthesis. A supplement of CoQ10 is therefore essential to restore natural levels to their optimum and counter the effects of statin drugs. 4. Slows Down Effects of Aging Mitochondrial ATP synthesis is an important function for maintaining a fast metabolism, strength of muscles, strong bones, youthful skin and healthy tissue. Tissue levels of coenzyme Q10 have been reported to decline with age, and this is believed to contribute to declines in energy metabolism and degeneration of organs, such as the liver and heart, and skeletal muscle. Although supplementing with CoQ10 has not been shown to increase the life span of animals that have been tested with it, researchers believe it can slow down the age-related increase in DNA damage that naturally affect [name] all. More research is still needed to draw conclusions, but possible anti-aging benefits of consuming more CoQ10 include decreased muscle loss, less signs of skin damage, and protection from bone or joint injuries. 5. Helps Maintain Optimal pH Levels Within cells, CoQ10 helps transport proteins across membranes and separate certain digestive enzymes from the rest of the cell, which helps maintain optimal pH. It's believed that diseases develop more easily in environments that don't have proper pH levels, specifically those that are not overly acidic. For several reasons, likely including its ability to maintain proper pH, several  studies have found that CoQ10 can help improve overall  immune function and might even lower risk for cancer. Starting around the 1960s, researchers began testing the effects of CoQ10 on immune function and found that people with certain types of cancers (myeloma, lymphoma, breast, lung, prostate, pancreas and colon) had reduced levels in their blood. Recently, studies involving adult women with breast cancer found that when patients supplemented with CoQ10, the women's conditions improved. (4) 6. Protects Cognitive Health In those with cognitive impairments, such as Parkinson's disease, increased oxidative stress in a part of the brain called the substantia nigra is thought to contribute to symptoms. CoQ10 has been shown to offset decreases in activity of mitochondrial electron transport chains that affect nerve channels and brain function, and studies show that people with cognitive disorders tend to have reduced levels of CoQ10 in their blood. (5) Several studies have investigated the effects of CoQ10 in individuals with Parkinson's disease. One randomized, placebo-controlled trial that evaluated the efficacy of 300, 600 or 1,200 milligrams a day given to 80 people with early Parkinson's disease found that supplementation was well-tolerated and associated with slower deterioration of cognitive functions compared to the placebo. Other trials have shown that around 360 milligrams a day taken for four weeks moderately benefited Parkinson's disease patients. (6)

## 2016-01-24 NOTE — Assessment & Plan Note (Signed)
He has cut back but does not think he can quit, encouraged to cut back further

## 2016-01-24 NOTE — Assessment & Plan Note (Signed)
Residual CTO RCA with collaterals and 40% CFX-EF 45-50%, plan is for medical Rx for residual CAD

## 2016-01-24 NOTE — Assessment & Plan Note (Signed)
Pt requires crutches to ambulate

## 2016-01-24 NOTE — Progress Notes (Signed)
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 He has a history of Polio and uses crutches. He saw Dr Rennis Golden in may 2016 for chest pain. Myoview then low risk with a small ant/apical defect. He was treated medically and did well until recently when he called complaining of exertional chest tightness, bilateral arm pain, and dyspnea. He was seen in the office 12/18/15 and set up for coronary angiogram which was done 12/23/15. This was done via Rt RA approach and revealed a CTO of the RCA with L-R collaterals from the CFX, a 40% CFX, and a 95% proximal LAD-treated with a DES. His EF was 45-50%.  He was noted to be mildly hypotensive and it was decided to stop his HCTZ and Lisinopril 40 mg and discharge him on Lisinopril 5 mg.            He is in the office today for follow up. He denies any chest pain that is similar to pre PCI symptoms (pressure, SOB). He has cut back from 2 ppd to 1 ppd.    Current Outpatient Prescriptions  Medication Sig Dispense Refill  . aspirin EC 81 MG tablet Take 81 mg by mouth daily.    . clopidogrel (PLAVIX) 75 MG tablet Take 1 tablet (75 mg total) by mouth daily with breakfast. 90 tablet 3  . lisinopril (PRINIVIL,ZESTRIL) 10 MG tablet Take 1 tablet (10 mg total) by mouth daily. 90 tablet 0  . lovastatin (MEVACOR) 20 MG tablet TAKE 1 TABLET (20 MG TOTAL) BY MOUTH AT BEDTIME. 30 tablet 6  . metoprolol tartrate (LOPRESSOR) 25 MG tablet Take 1 tablet (25 mg total) by mouth 2 (two) times daily. 60 tablet 3  . nitroGLYCERIN (NITROSTAT) 0.4 MG SL tablet Place 1 tablet (0.4 mg total) under the tongue every 5 (five) minutes as needed for chest pain. 90 tablet 3  . omega-3 acid ethyl esters (LOVAZA) 1 g capsule Take 1 g by mouth 3 (three) times daily.     No current facility-administered medications for this visit.     Allergies    Allergen Reactions  . Pork-Derived Products     Patient does not eat pork because of his culture    Social History   Social History  . Marital status: Married    Spouse name: N/A  . Number of children: N/A  . Years of education: N/A   Occupational History  . Not on file.   Social History Main Topics  . Smoking status: Current Every Day Smoker    Packs/day: 1.00    Years: 30.00    Types: Cigarettes  . Smokeless tobacco: Never Used  . Alcohol use Yes     Comment: occ  . Drug use: No  . Sexual activity: Not on file   Other Topics Concern  . Not on file   Social History Narrative  . No narrative on file     Review of Systems: General: negative for chills, fever, night sweats or weight changes.  Cardiovascular: negative for chest pain, dyspnea on exertion, edema, orthopnea, palpitations, paroxysmal nocturnal dyspnea or shortness of breath Dermatological: negative for rash Respiratory: negative for cough or wheezing Urologic: negative for hematuria Abdominal: negative for nausea, vomiting, diarrhea, bright red blood per rectum, melena, or hematemesis Neurologic: negative for visual changes, syncope, or dizziness All other systems reviewed  and are otherwise negative except as noted above.    Blood pressure 132/88, pulse 83, height 5\' 5"  (1.651 m), weight 170 lb 6.4 oz (77.3 kg).  General appearance: alert, cooperative and no distress Neck: no carotid bruit and no JVD Lungs: clear to auscultation bilaterally Heart: regular rate and rhythm Skin: Skin color, texture, turgor normal. No rashes or lesions Neurologic: Grossly normal  EKG NSR, Q in V2  ASSESSMENT AND PLAN:   CAD S/P LAD DES 12/23/15 Residual CTO RCA with collaterals and 40% CFX-EF 45-50%, plan is for medical Rx for residual CAD  Essential hypertension B/P has drifted up since discharge, increase Lisinopril to 10 mg  Hypercholesteremia LDL 73, HDL 23, continue statin Rx  Polio Pt requires crutches  to ambulate  Smoker He has cut back but does not think he can quit, encouraged to cut back further   PLAN  Increase Lisinopril to 10 mg. F/U Dr Rennis GoldenHilty in 3 months.  Corine ShelterLuke Nelta Caudill PA-C 01/24/2016 12:12 PM

## 2016-01-24 NOTE — Assessment & Plan Note (Signed)
LDL 73, HDL 23, continue statin Rx

## 2016-01-24 NOTE — Assessment & Plan Note (Signed)
B/P has drifted up since discharge, increase Lisinopril to 10 mg

## 2016-04-12 IMAGING — CR DG CHEST 2V
2 series · 2 of 2 positions shown · non-contrast
Comparison: Chest radiograph performed 03/21/2013

CLINICAL DATA: Cough and chest pain, radiating to both arms.
History of smoking.

EXAM:
CHEST  2 VIEW

[w chest pa]
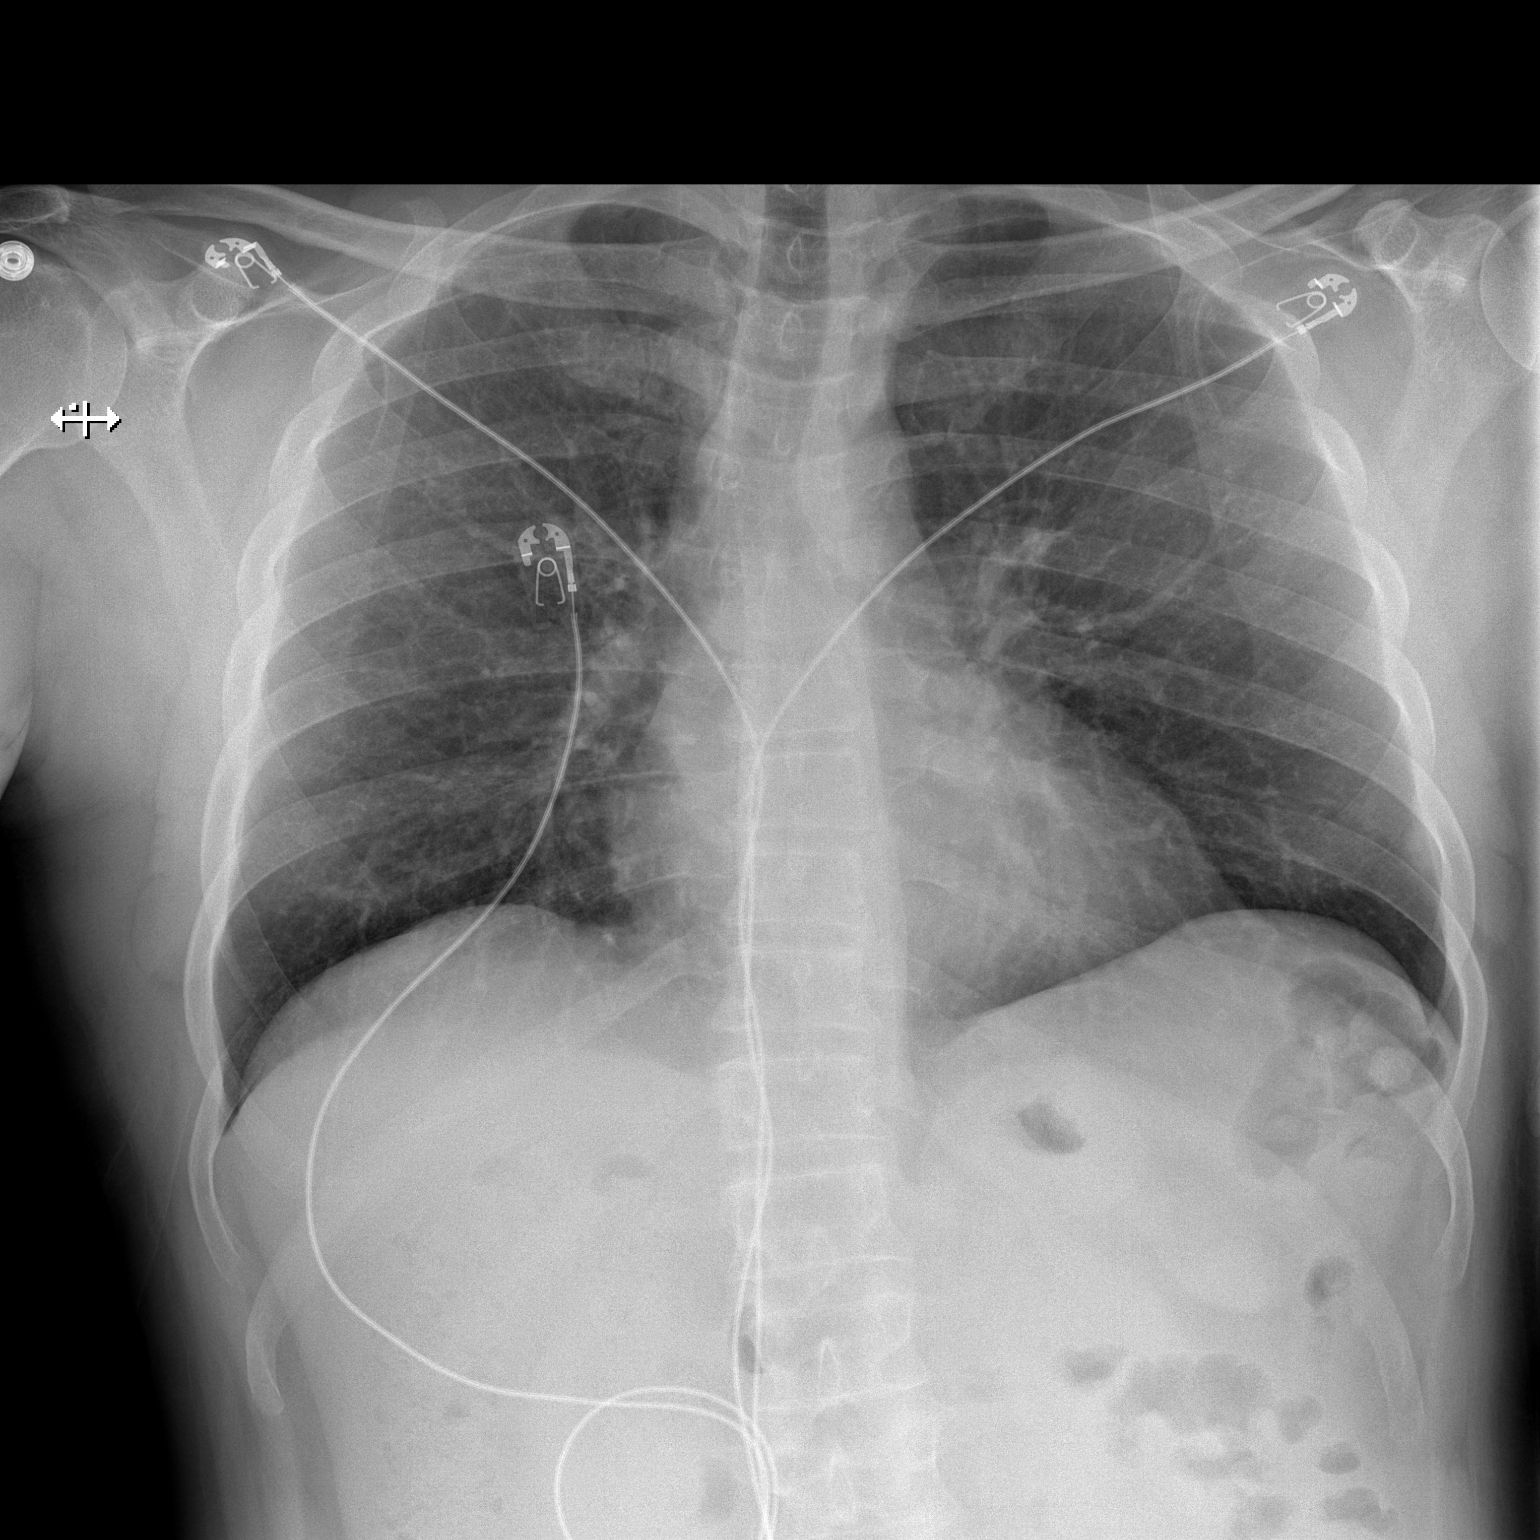

[w chest lat]
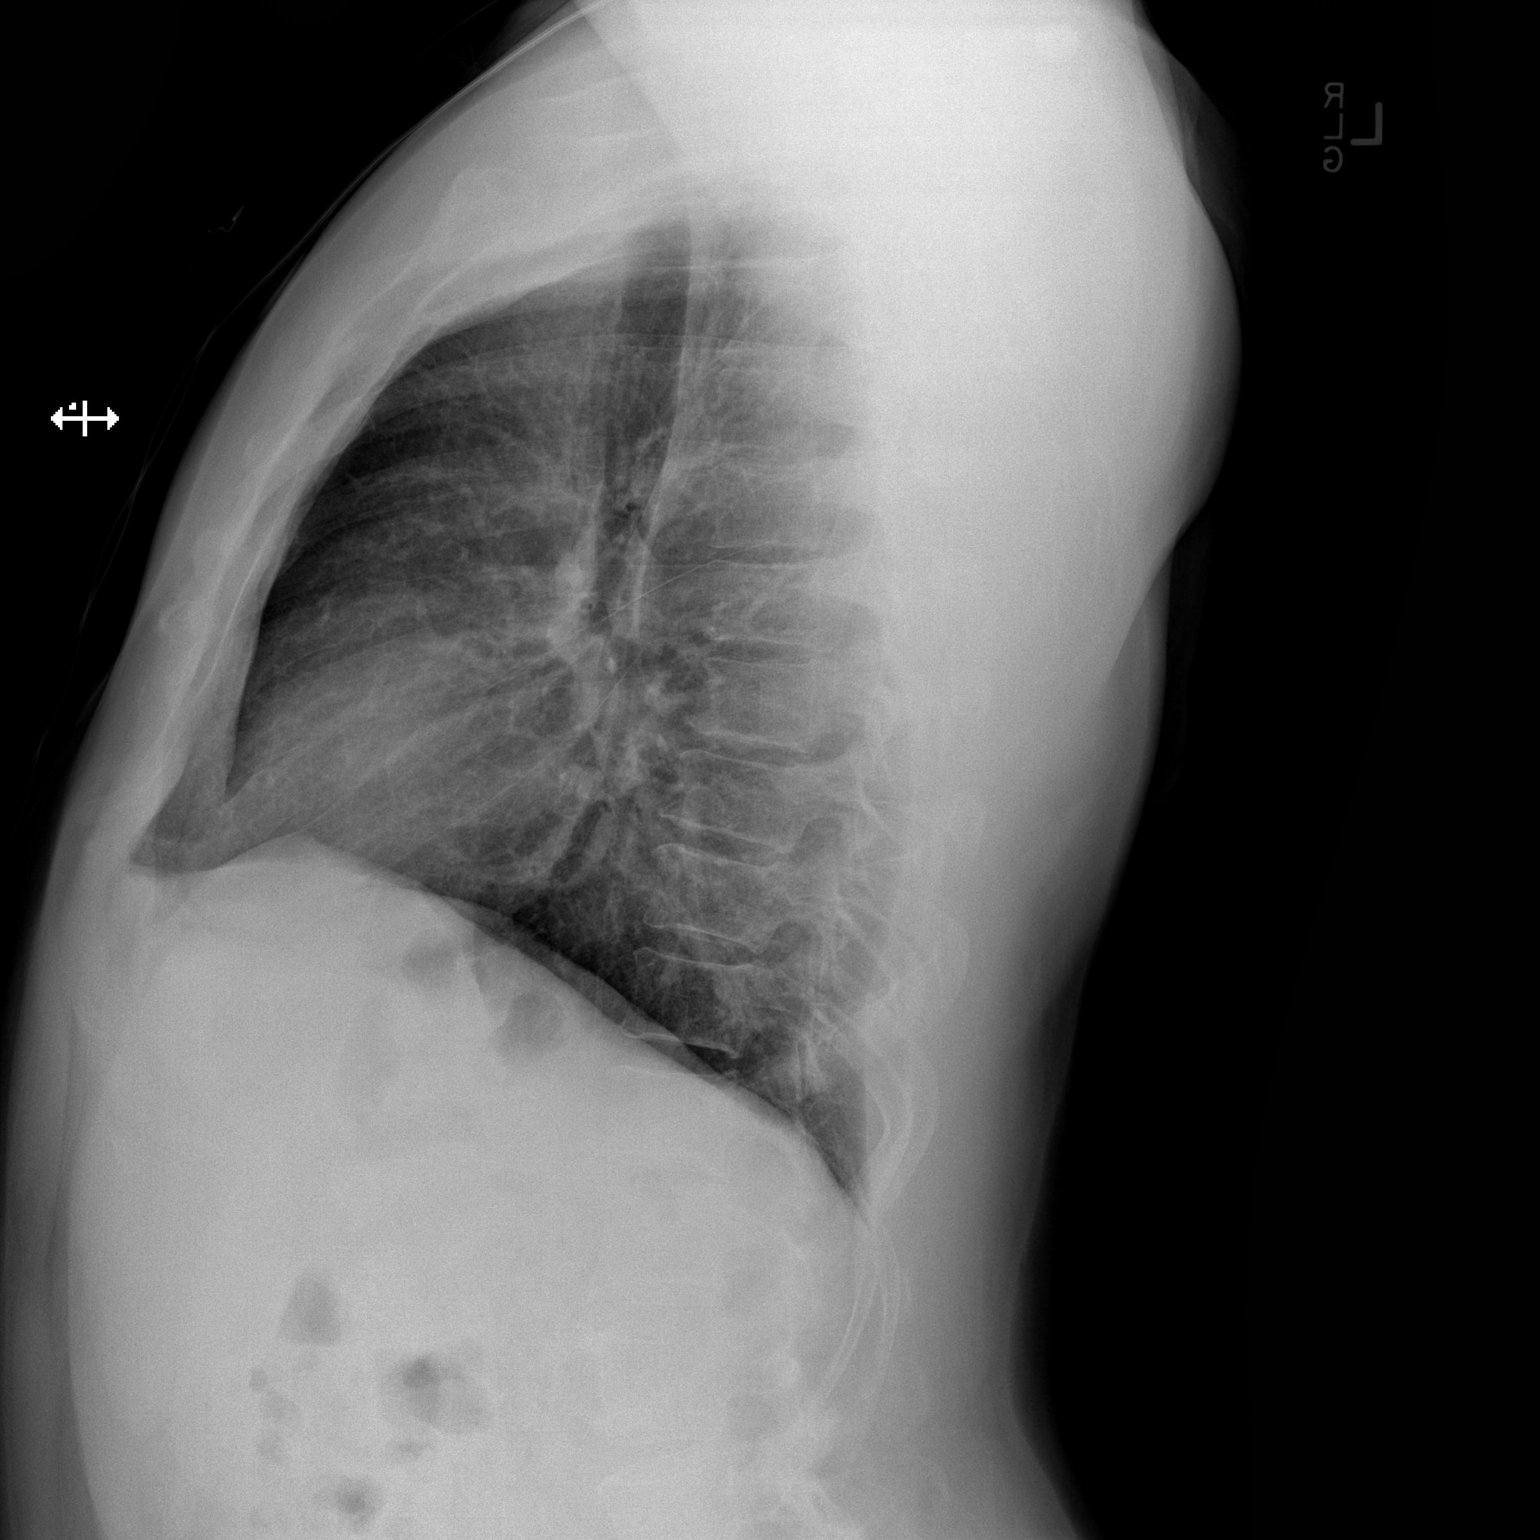

[2 of 2 positions shown; findings below may reference images not displayed]

FINDINGS: The lungs are well-aerated and clear. There is no evidence of focal
opacification, pleural effusion or pneumothorax.

The heart is normal in size; the mediastinal contour is within
normal limits. No acute osseous abnormalities are seen.
IMPRESSION: No acute cardiopulmonary process seen.

## 2016-04-19 ENCOUNTER — Emergency Department (HOSPITAL_COMMUNITY)
Admission: EM | Admit: 2016-04-19 | Discharge: 2016-04-19 | Disposition: A | Discharge status: Home / Self Care | Payer: Medicaid Other | Attending: Cardiology | Admitting: Cardiology

## 2016-04-19 ENCOUNTER — Emergency Department (HOSPITAL_COMMUNITY): Payer: Medicaid Other

## 2016-04-19 ENCOUNTER — Encounter (HOSPITAL_COMMUNITY): Payer: Self-pay | Admitting: Emergency Medicine

## 2016-04-19 DIAGNOSIS — Z955 Presence of coronary angioplasty implant and graft: Secondary | ICD-10-CM | POA: Insufficient documentation

## 2016-04-19 DIAGNOSIS — R079 Chest pain, unspecified: Secondary | ICD-10-CM | POA: Diagnosis present

## 2016-04-19 DIAGNOSIS — Z9861 Coronary angioplasty status: Secondary | ICD-10-CM

## 2016-04-19 DIAGNOSIS — I2510 Atherosclerotic heart disease of native coronary artery without angina pectoris: Secondary | ICD-10-CM | POA: Diagnosis not present

## 2016-04-19 DIAGNOSIS — I10 Essential (primary) hypertension: Secondary | ICD-10-CM | POA: Diagnosis not present

## 2016-04-19 DIAGNOSIS — F17210 Nicotine dependence, cigarettes, uncomplicated: Secondary | ICD-10-CM | POA: Insufficient documentation

## 2016-04-19 DIAGNOSIS — R0789 Other chest pain: Secondary | ICD-10-CM

## 2016-04-19 DIAGNOSIS — Z7982 Long term (current) use of aspirin: Secondary | ICD-10-CM | POA: Insufficient documentation

## 2016-04-19 DIAGNOSIS — I160 Hypertensive urgency: Secondary | ICD-10-CM | POA: Diagnosis present

## 2016-04-19 DIAGNOSIS — I200 Unstable angina: Secondary | ICD-10-CM

## 2016-04-19 DIAGNOSIS — I25110 Atherosclerotic heart disease of native coronary artery with unstable angina pectoris: Secondary | ICD-10-CM | POA: Insufficient documentation

## 2016-04-19 DIAGNOSIS — Z79899 Other long term (current) drug therapy: Secondary | ICD-10-CM | POA: Insufficient documentation

## 2016-04-19 LAB — BASIC METABOLIC PANEL
ANION GAP: 11 (ref 5–15)
BUN: 5 mg/dL — ABNORMAL LOW (ref 6–20)
CHLORIDE: 106 mmol/L (ref 101–111)
CO2: 22 mmol/L (ref 22–32)
Calcium: 9.4 mg/dL (ref 8.9–10.3)
Creatinine, Ser: 0.74 mg/dL (ref 0.61–1.24)
GFR calc non Af Amer: >60 mL/min (ref >60)
Glucose, Bld: 91 mg/dL (ref 65–99)
POTASSIUM: 3.5 mmol/L (ref 3.5–5.1)
SODIUM: 139 mmol/L (ref 135–145)

## 2016-04-19 LAB — CBC
HCT: 46 % (ref 39.0–52.0)
HEMOGLOBIN: 15.6 g/dL (ref 13.0–17.0)
MCH: 29.5 pg (ref 26.0–34.0)
MCHC: 33.9 g/dL (ref 30.0–36.0)
MCV: 87 fL (ref 78.0–100.0)
Platelets: 217 10*3/uL (ref 150–400)
RBC: 5.29 MIL/uL (ref 4.22–5.81)
RDW: 13.6 % (ref 11.5–15.5)
WBC: 11.3 10*3/uL — AB (ref 4.0–10.5)

## 2016-04-19 LAB — I-STAT TROPONIN, ED
TROPONIN I, POC: 0.01 ng/mL (ref 0.00–0.08)
Troponin i, poc: 0.01 ng/mL (ref 0.00–0.08)

## 2016-04-19 LAB — PROTIME-INR
INR: 1.01
PROTHROMBIN TIME: 13.3 s (ref 11.4–15.2)

## 2016-04-19 MED ORDER — METOPROLOL TARTRATE 25 MG PO TABS
50.0000 mg | ORAL_TABLET | Freq: Once | ORAL | Status: AC
  Administered 2016-04-19: 50 mg via ORAL
  Filled 2016-04-19: qty 2

## 2016-04-19 MED ORDER — METOPROLOL TARTRATE 5 MG/5ML IV SOLN
5.0000 mg | Freq: Once | INTRAVENOUS | Status: AC
  Administered 2016-04-19: 5 mg via INTRAVENOUS
  Filled 2016-04-19: qty 5

## 2016-04-19 MED ORDER — LISINOPRIL 20 MG PO TABS
20.0000 mg | ORAL_TABLET | Freq: Once | ORAL | Status: AC
  Administered 2016-04-19: 20 mg via ORAL
  Filled 2016-04-19: qty 1

## 2016-04-19 MED ORDER — LISINOPRIL 10 MG PO TABS: 10.0000 mg | ORAL_TABLET | Freq: Every day | ORAL | 0 refills | Status: DC

## 2016-04-19 MED ORDER — ASPIRIN 81 MG PO CHEW
324.0000 mg | CHEWABLE_TABLET | Freq: Once | ORAL | Status: AC
  Administered 2016-04-19: 324 mg via ORAL
  Filled 2016-04-19: qty 4

## 2016-04-19 NOTE — ED Notes (Signed)
Dr Hilty at bedside. 

## 2016-04-19 NOTE — ED Notes (Signed)
Dr Ray at bedside. 

## 2016-04-19 NOTE — ED Triage Notes (Signed)
Pt. reports intermittent left chest pain onset this week radiating to both arms and posterior neck , mild SOB , no nausea or diaphoresis , history of CAD/Coronary stent placement , he is hypertensive at arrival .

## 2016-04-19 NOTE — Discharge Instructions (Signed)
Recheck with Dr. Rennis GoldenHilty at 945 tomorrow morning as discussed. Increase lisinopril to 20 mg per day. Return if worsening chest pain or additional symptoms such as shortness of breath.

## 2016-04-19 NOTE — ED Notes (Signed)
Pt would like to leave. This RN explained to the pt that Dr. Rosalia Hammersay and Cardiology wanted to admit the pt. The pt stated that he understood but that he needed to go home, he is the only provider in his house and has children he has to take care of. Dr. Rosalia Hammersay notified that the pt is still expressing interest in leaving after this RN explained to him that leaving would be AMA and not recommended. Dr. Rosalia Hammersay will speak to the pt.

## 2016-04-19 NOTE — ED Provider Notes (Addendum)
[facility]-EMER[facility]ENCY DEPT Provider Note   CSN: [id number] Arrival date & time: 04/19/16  [id number]     History   Chief Complaint Chief Complaint  Patient presents with  . Chest Pain    HPI John Mcintyre is a 45 y.o. male.  HPI  This is a 45 year old man history of coronary artery disease presents today stating he has had chest pain for 2 days. Pains in the left chest area and radiates to his neck, bilateral shoulders and numbness in bilateral hands. Patient has a history of coronary artery disease with unstable angina treated in July with balloon dilation of the proximal LAD and stent placement. Patient now with intermittent chest pain for the past 2 days. He denies cough, fever, dyspnea, nausea, vomiting, or diarrhea. He does complain of some headache neck pain and hand pain which has been somewhat chronic per his records. He denies any recent injury. He states he has been taking his medications as prescribed. 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  Past Medical History:  Diagnosis Date  . Coronary artery disease   . Hypercholesteremia   . Hypertension   . Polio     Patient Active Problem List   Diagnosis Date Noted  . CAD S/P LAD DES 12/23/15 12/23/2015  . Smoker 12/23/2015  . Unstable angina (HCC)   . Essential hypertension 10/11/2014  . Hypercholesteremia 10/11/2014  . Chest pain 10/11/2014  . Polio     Past Surgical History:  Procedure Laterality Date  . CARDIAC CATHETERIZATION N/A 12/23/2015   Procedure: Left Heart Cath and Coronary Angiography;  Surgeon: Kathleene Hazelhristopher D McAlhany, MD;  [facility];  Service: Cardiovascular;  Laterality: N/A;  . CARDIAC CATHETERIZATION N/A 12/23/2015   Procedure: Coronary Stent Intervention;  Surgeon: Kathleene Hazelhristopher D McAlhany, MD;  Location: [facility] INVASIVE CV LAB;  Service: Cardiovascular;  Laterality: N/A;  . CORONARY STENT PLACEMENT  12/23/2015   Successful PTCA/DES x 1 proximal LAD  . HIP  SURGERY Left 1987   for polio, not sure what was done  . KNEE SURGERY Left 1987   for polio       Home Medications    Prior to Admission medications   Medication Sig Start Date End Date Taking? Authorizing Provider  aspirin EC 81 MG tablet Take 81 mg by mouth daily.    Historical Provider, MD  clopidogrel (PLAVIX) 75 MG tablet Take 1 tablet (75 mg total) by mouth daily with breakfast. 12/24/15   Abelino DerrickLuke K Kilroy, PA-C  lisinopril (PRINIVIL,ZESTRIL) 10 MG tablet Take 1 tablet (10 mg total) by mouth daily. 01/24/16   Abelino DerrickLuke K Kilroy, PA-C  lovastatin (MEVACOR) 20 MG tablet TAKE 1 TABLET (20 MG TOTAL) BY MOUTH AT BEDTIME. 11/08/15   Chrystie NoseKenneth C Hilty, MD  metoprolol tartrate (LOPRESSOR) 25 MG tablet Take 1 tablet (25 mg total) by mouth 2 (two) times daily. 12/18/15   Rosalio MacadamiaLori C Gerhardt, NP  nitroGLYCERIN (NITROSTAT) 0.4 MG SL tablet Place 1 tablet (0.4 mg total) under the tongue every 5 (five) minutes as needed for chest pain. 12/18/15   Rosalio MacadamiaLori C Gerhardt, NP  omega-3 acid ethyl esters (LOVAZA) 1 g capsule Take 1 g by mouth 3 (three) times daily.    Historical Provider, MD    Family History Family History  Problem Relation Age of Onset  . Hypertension Mother   . Heart failure Father     Social History Social History  Substance Use Topics  . Smoking  status: Current Every Day Smoker    Packs/day: 0.00    Types: Cigarettes  . Smokeless tobacco: Never Used  . Alcohol use Yes     Comment: occ     Allergies   Pork-derived products   Review of Systems Review of Systems  All other systems reviewed and are negative.    Physical Exam Updated Vital Signs BP (!) 174/113   Pulse 70   Temp 97.6 F (36.4 C) (Oral)   Resp 17   SpO2 99%   Physical Exam  Constitutional: He is oriented to person, place, and time. He appears well-developed and well-nourished.  HENT:  Head: Normocephalic and atraumatic.  Right Ear: External ear normal.  Left Ear: External ear normal.  Nose: Nose normal.   Mouth/Throat: Oropharynx is clear and moist.  Eyes: Conjunctivae and EOM are normal. Pupils are equal, round, and reactive to light.  Neck: Normal range of motion. Neck supple.  Cardiovascular: Normal rate, regular rhythm, normal heart sounds and intact distal pulses.   Pulmonary/Chest: Effort normal and breath sounds normal.  Abdominal: Soft. Bowel sounds are normal.  Musculoskeletal: Normal range of motion.  Bilateral lower extremity atrophy consistent with history of polio  Neurological: He is alert and oriented to person, place, and time. He has normal reflexes.  Skin: Skin is warm and dry.  Psychiatric: He has a normal mood and affect. His behavior is normal. Judgment and thought content normal.  Nursing note and vitals reviewed.    ED Treatments / Results  Labs (all labs ordered are listed, but only abnormal results are displayed) Labs Reviewed  BASIC METABOLIC PANEL - Abnormal; Notable for the following:       Result Value   BUN 5 (*)    All other components within normal limits  CBC - Abnormal; Notable for the following:    WBC 11.3 (*)    All other components within normal limits  Beverlee Nims, ED    EKG  EKG Interpretation  Date/Time:  Sunday April 19 2016 06:41:47 EST Ventricular Rate:  80 PR Interval:  160 QRS Duration: 88 QT Interval:  392 QTC Calculation: 452 R Axis:   46 Text Interpretation:  Normal sinus rhythm Cannot rule out Anterior infarct , age undetermined Abnormal ECG No significant change since last tracing Confirmed by Gwendloyn Forsee MD, Duwayne Heck [phone]) on 04/19/2016 7:25:15 AM       Radiology Dg Chest 2 View  Result Date: 04/19/2016 CLINICAL DATA:  Chest pain for 2 days on the left side. EXAM: CHEST  2 VIEW COMPARISON:  12/31/2015 FINDINGS: Heart and mediastinal contours are within normal limits. No focal opacities or effusions. No acute bony abnormality. IMPRESSION: No active cardiopulmonary disease. Electronically Signed   By:  Charlett Nose M.D.   On: 04/19/2016 07:37    Procedures Procedures (including critical care time)  Medications Ordered in ED Medications  aspirin chewable tablet 324 mg (324 mg Oral Given 04/19/16 0834)  metoprolol (LOPRESSOR) injection 5 mg (5 mg Intravenous Given 04/19/16 0841)  metoprolol tartrate (LOPRESSOR) tablet 50 mg (50 mg Oral Given 04/19/16 4818)     Initial Impression / Assessment and Plan / ED Course  I have reviewed the triage vital signs and the nursing notes.  Pertinent labs & imaging results that were available during my care of the patient were reviewed by me and considered in my medical decision making (see chart for details).  Clinical Course    Patient pain free here.  Iv lopressor  and po am dose given with bp decreaed to 151/104.  ASA given here.  Given patient's known cad plan cardiology consult for admission.  Reviewed labs and EKG and chest x-Keianna Signer. No acute changes noted. Patient hemodynamically stable here. Discussed with Dr. Mayford Knifeurner on call for cardiology and they will see and assess.  Final Clinical Impressions(s) / ED Diagnoses   Final diagnoses:  Unstable angina (HCC)  Hypertension, unspecified type  Coronary artery disease involving native heart without angina pectoris, unspecified vessel or lesion type    New Prescriptions Discharge Medication List as of 04/19/2016  2:27 PM    Dr. Rennis GoldenHilty here and patient wishes to leave.  He plans increasing lisinopril and he will see in office at 0945.  Will check delta troponin begin lisinopril here.    Margarita Grizzleanielle Jenette Rayson, MD 04/19/16 [id number]    Margarita Grizzleanielle Ermon Sagan, MD 04/19/16 [phone]

## 2016-04-19 NOTE — Consult Note (Signed)
Consultation Note  Chief Complaint:  Chest pain  Cardiologist: Isaac Dubie  Primary Care Physician: Pcp Not In System  HPI:  This is a 45 y.o. male originally from [place] with a past medical history significant for polio and this affected his legs significantly causing weakness in the need to use crutches to walk. 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 Recently he's been having worsening pain in his upper chest shoulders and down both arms. Some the pain is in the back of his neck and even radiates to the right half of his face. While some of those symptoms certainly sound neurologic, he also has some associated fatigue and shortness of breath. He is a smoker and has a history of hypertension and dyslipidemia which is not been well controlled. He was recently started on lovastatin. Triglycerides were noted to be elevated greater than 700 and recently came down to the 300s. His father has a history of heart disease and sounds like he died from heart failure in his 43s. He underwent a nuclear stress test in 2016 which was interpreted as low risk. There was a very small distal anteroapical fixed defect which was suggestive of possible scar. EF was low normal at 52%. No reversible ischemia was seen. Subsequently he presented with worsening chest pain and was referred by Truitt Merle, NP for cardiac catheterization in 11/2015. 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 There was a CTO of the RCA with left to right collaterals and severe proximal LAD stenosis. He had successful PCI to the proximal LAD and was chest pain free thereafter.  He now presents with recurrent chest pain for the past 2 days which is similar to his prior anginal symptoms. He reports compliance with his medications, but did not take it this morning. BP was 174/113 on admission. He was seen in the office in 12/2015 and his lisinopril was increased to 10 mg daily. Initial troponin was negative. Labs  indicate a mildly elevated WBC count at 11.3. CXR unremarkable. EKG shows NSR without ischemic changes. Cardiology is asked to assess regarding chest pain. He has a scheduled follow-up appointment with me on 11/29.  PMHx:  Past Medical History:  Diagnosis Date  . Coronary artery disease   . Hypercholesteremia   . Hypertension   . Polio     Past Surgical History:  Procedure Laterality Date  . CARDIAC CATHETERIZATION N/A 12/23/2015   Procedure: Left Heart Cath and Coronary Angiography;  Surgeon: Burnell Blanks, MD;  Location: [place] [facility];  Service: Cardiovascular;  Laterality: N/A;  . CARDIAC CATHETERIZATION N/A 12/23/2015   Procedure: Coronary Stent Intervention;  Surgeon: Burnell Blanks, MD;  Location: [place] [facility];  Service: Cardiovascular;  Laterality: N/A;  . CORONARY STENT PLACEMENT  12/23/2015   Successful PTCA/DES x 1 proximal LAD  . HIP SURGERY Left 1987   for polio, not sure what was done  . KNEE SURGERY Left 1987   for polio    FAMHx:  Family History  Problem Relation Age of Onset  . Hypertension Mother   . Heart failure Father     SOCHx:   reports that he has been smoking Cigarettes.  He has been smoking about 0.00 packs per day. He has never used smokeless tobacco. He reports that he drinks alcohol. He reports that he does not use drugs.  ALLERGIES:  Allergies  Allergen Reactions  . Pork-Derived Products  Patient does not eat pork because of his culture    ROS: Pertinent items noted in HPI and remainder of comprehensive ROS otherwise negative.  HOME MEDS: No current facility-administered medications on file prior to encounter.    Current Outpatient Prescriptions on File Prior to Encounter  Medication Sig Dispense Refill  . aspirin EC 81 MG tablet Take 81 mg by mouth daily.    . clopidogrel (PLAVIX) 75 MG tablet Take 1 tablet (75 mg total) by mouth daily with breakfast. 90 tablet 3  . lisinopril (PRINIVIL,ZESTRIL) 10 MG  tablet Take 1 tablet (10 mg total) by mouth daily. 90 tablet 0  . lovastatin (MEVACOR) 20 MG tablet TAKE 1 TABLET (20 MG TOTAL) BY MOUTH AT BEDTIME. 30 tablet 6  . metoprolol tartrate (LOPRESSOR) 25 MG tablet Take 1 tablet (25 mg total) by mouth 2 (two) times daily. 60 tablet 3  . nitroGLYCERIN (NITROSTAT) 0.4 MG SL tablet Place 1 tablet (0.4 mg total) under the tongue every 5 (five) minutes as needed for chest pain. 90 tablet 3  . omega-3 acid ethyl esters (LOVAZA) 1 g capsule Take 1 g by mouth 3 (three) times daily.      LABS/IMAGING: Results for orders placed or performed during the hospital encounter of 04/19/16 (from the past 48 hour(s))  Basic metabolic panel     Status: Abnormal   Collection Time: 04/19/16  7:00 AM  Result Value Ref Range   Sodium 139 135 - 145 mmol/L   Potassium 3.5 3.5 - 5.1 mmol/L   Chloride 106 101 - 111 mmol/L   CO2 22 22 - 32 mmol/L   Glucose, Bld 91 65 - 99 mg/dL   BUN 5 (L) 6 - 20 mg/dL   Creatinine, Ser 0.74 0.61 - 1.24 mg/dL   Calcium 9.4 8.9 - 10.3 mg/dL   GFR calc non Af Amer >60 >60 mL/min   GFR calc Af Amer >60 >60 mL/min    Comment: (NOTE) The eGFR has been calculated using the CKD EPI equation. This calculation has not been validated in all clinical situations. eGFR's persistently <60 mL/min signify possible Chronic Kidney Disease.    Anion gap 11 5 - 15  CBC     Status: Abnormal   Collection Time: 04/19/16  7:00 AM  Result Value Ref Range   WBC 11.3 (H) 4.0 - 10.5 K/uL   RBC 5.29 4.22 - 5.81 MIL/uL   Hemoglobin 15.6 13.0 - 17.0 g/dL   HCT 46.0 39.0 - 52.0 %   MCV 87.0 78.0 - 100.0 fL   MCH 29.5 26.0 - 34.0 pg   MCHC 33.9 30.0 - 36.0 g/dL   RDW 13.6 11.5 - 15.5 %   Platelets 217 150 - 400 K/uL  Protime-INR (order if Patient is taking Coumadin / Warfarin)     Status: None   Collection Time: 04/19/16  7:00 AM  Result Value Ref Range   Prothrombin Time 13.3 11.4 - 15.2 seconds   INR 1.01   I-stat troponin, ED     Status: None    Collection Time: 04/19/16  7:06 AM  Result Value Ref Range   Troponin i, poc 0.01 0.00 - 0.08 ng/mL   Comment 3            Comment: Due to the release kinetics of cTnI, a negative result within the first hours of the onset of symptoms does not rule out myocardial infarction with certainty. If myocardial infarction is still suspected, repeat the test at appropriate  intervals.    Dg Chest 2 View  Result Date: 04/19/2016 CLINICAL DATA:  Chest pain for 2 days on the left side. EXAM: CHEST  2 VIEW COMPARISON:  12/31/2015 FINDINGS: Heart and mediastinal contours are within normal limits. No focal opacities or effusions. No acute bony abnormality. IMPRESSION: No active cardiopulmonary disease. Electronically Signed   By: Rolm Baptise M.D.   On: 04/19/2016 07:37    VITALS: Vitals:   04/19/16 1230 04/19/16 1245  BP: (!) 156/103 (!) 149/102  Pulse: 61 (!) 58  Resp: 15 20  Temp:      EXAM: General appearance: alert and no distress Lungs: clear to auscultation bilaterally Heart: regular rate and rhythm Extremities: extremities normal, atraumatic, no cyanosis or edema Neurologic: Mental status: Alert, oriented, thought content appropriate  IMPRESSION: Principal Problem:   Hypertensive urgency Active Problems:   Essential hypertension   Chest pain   CAD S/P LAD DES 12/23/15   PLAN: 1. Mr. Rideaux presents with 2 days of left chest, posterior occiptal pain and headache. BP has been elevated at 170/110 on admission - reports medication compliance. I suspect his pain is related to hypertensive urgency. His coronary anatomy is known and includes a CTO of the RCA with collaterals. These are likely to underfill causing angina in the setting of hypertensive urgency. Recently he had an increase in lisinopril to 10 mg daily. He reports he has to pick up his daughter from work and does not want to be admitted to the hospital for bp management. I spoke with Dr. Jeanell Sparrow the ER attending and am  recommending that we increase his lisinopril to 20 mg daily and give that now. Also, check a delta troponin and if that is negative, he can be discharged. I will arrange to see him in follow-up in the [facility] office tomorrow morning at 9:45 as an add-on for further medication titratation.  Thanks for the consultation.  Pixie Casino, MD, [facility] Attending Cardiologist [place] C Dannisha Eckmann 04/19/2016, 1:06 PM

## 2016-04-19 NOTE — ED Notes (Signed)
RN informed of pt BP

## 2016-04-20 ENCOUNTER — Encounter: Payer: Self-pay | Admitting: Internal Medicine

## 2016-04-20 ENCOUNTER — Ambulatory Visit (INDEPENDENT_AMBULATORY_CARE_PROVIDER_SITE_OTHER): Payer: Medicaid Other | Admitting: Internal Medicine

## 2016-04-20 VITALS — BP 152/90 | HR 92 | Ht 65.0 in | Wt 166.0 lb

## 2016-04-20 DIAGNOSIS — I10 Essential (primary) hypertension: Secondary | ICD-10-CM | POA: Diagnosis not present

## 2016-04-20 DIAGNOSIS — Z9861 Coronary angioplasty status: Secondary | ICD-10-CM

## 2016-04-20 DIAGNOSIS — A809 Acute poliomyelitis, unspecified: Secondary | ICD-10-CM | POA: Diagnosis not present

## 2016-04-20 DIAGNOSIS — R0789 Other chest pain: Secondary | ICD-10-CM | POA: Diagnosis not present

## 2016-04-20 DIAGNOSIS — I2510 Atherosclerotic heart disease of native coronary artery without angina pectoris: Secondary | ICD-10-CM

## 2016-04-20 MED ORDER — LISINOPRIL 20 MG PO TABS: 20.0000 mg | ORAL_TABLET | Freq: Every day | ORAL | 6 refills | Status: DC

## 2016-04-20 NOTE — Progress Notes (Signed)
OFFICE NOTE  Chief Complaint:  Follow-up hospitalization  Primary Care Physician: Pcp Not In System  HPI:  John Mcintyre is a pleasant 45 year old male [place] He says for almost all of his life he had polio and this affected his legs significantly causing weakness in the need to use crutches to walk. 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 Recently he's been having worsening pain in his upper chest shoulders and down both arms. Some the pain is in the back of his neck and even radiates to the right half of his face. While some of those symptoms certainly sound neurologic, he also has some associated fatigue and shortness of breath. He is a smoker and has a history of hypertension and dyslipidemia which is not been well controlled. He was recently started on lovastatin. Triglycerides were noted to be elevated greater than 700 and recently came down to the 300s. His father has a history of heart disease and sounds like he died from heart failure in his 65s. I had the pleasure seeing John Mcintyre back in the office today. He underwent a nuclear stress test which was interpreted as low risk. There was a very small distal anteroapical fixed defect which was suggestive of possible scar. EF was low normal at 52%. No reversible ischemia was seen. He reports no further significant symptoms. Subsequently he presented with worsening chest pain and was referred by Truitt Merle, NP for cardiac catheterization in 11/2015. 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 There was a CTO of the RCA with left to right collaterals and severe proximal LAD stenosis. He had successful PCI to the proximal LAD and was chest pain free thereafter.   04/20/2016  He was seen in the ER yesterday with recurrent chest pain for the past 2 days which is similar to his prior anginal symptoms. He reports compliance with his medications, but did not take it this morning. BP  was 174/113 on admission. He was seen in the office in 12/2015 and his lisinopril was increased to 10 mg daily. Initial troponin was negative. Labs indicate a mildly elevated WBC count at 11.3. CXR unremarkable. EKG shows NSR without ischemic changes. He ruled out for acute MI and I increased his lisinopril to 20 mg daily. He said he try to get to the pharmacy for that prescription, but was somewhat confused that I wanted him to take twice the dose that he currently takes. This morning he took 10 mg of lisinopril blood pressure was 152/90. He denies any further chest pain. He took Aleve over-the-counter and reports improvement in his neck pain and shoulder pain. He continues to have difficulty ambulating, particularly with worsening weakness of his legs secondary to polio.  PMHx:  Past Medical History:  Diagnosis Date  . Coronary artery disease   . Hypercholesteremia   . Hypertension   . Polio     Past Surgical History:  Procedure Laterality Date  . CARDIAC CATHETERIZATION N/A 12/23/2015   Procedure: Left Heart Cath and Coronary Angiography;  Surgeon: Burnell Blanks, MD;  Location: [place] [facility];  Service: Cardiovascular;  Laterality: N/A;  . CARDIAC CATHETERIZATION N/A 12/23/2015   Procedure: Coronary Stent Intervention;  Surgeon: Burnell Blanks, MD;  Location: [place] [facility];  Service: Cardiovascular;  Laterality: N/A;  . CORONARY STENT PLACEMENT  12/23/2015   Successful PTCA/DES x 1 proximal LAD  . HIP SURGERY Left 1987  for polio, not sure what was done  . KNEE SURGERY Left 1987   for polio    FAMHx:  Family History  Problem Relation Age of Onset  . Hypertension Mother   . Heart failure Father     SOCHx:   reports that he has been smoking Cigarettes.  He has been smoking about 0.00 packs per day. He has never used smokeless tobacco. He reports that he drinks alcohol. He reports that he does not use drugs.  ALLERGIES:  Allergies  Allergen Reactions  .  Pork-Derived Products     Patient does not eat pork because of his culture    ROS: Pertinent items noted in HPI and remainder of comprehensive ROS otherwise negative.  HOME MEDS: Current Outpatient Prescriptions  Medication Sig Dispense Refill  . aspirin EC 81 MG tablet Take 81 mg by mouth daily.    . clopidogrel (PLAVIX) 75 MG tablet Take 1 tablet (75 mg total) by mouth daily with breakfast. 90 tablet 3  . lisinopril (PRINIVIL,ZESTRIL) 10 MG tablet Take 1 tablet (10 mg total) by mouth daily. Increase to two tablets daily 90 tablet 0  . lovastatin (MEVACOR) 20 MG tablet TAKE 1 TABLET (20 MG TOTAL) BY MOUTH AT BEDTIME. 30 tablet 6  . metoprolol tartrate (LOPRESSOR) 25 MG tablet Take 1 tablet (25 mg total) by mouth 2 (two) times daily. 60 tablet 3  . nitroGLYCERIN (NITROSTAT) 0.4 MG SL tablet Place 1 tablet (0.4 mg total) under the tongue every 5 (five) minutes as needed for chest pain. 90 tablet 3  . omega-3 acid ethyl esters (LOVAZA) 1 g capsule Take 1 g by mouth 3 (three) times daily.     No current facility-administered medications for this visit.     LABS/IMAGING: Results for orders placed or performed during the hospital encounter of 04/19/16 (from the past 48 hour(s))  Basic metabolic panel     Status: Abnormal   Collection Time: 04/19/16  7:00 AM  Result Value Ref Range   Sodium 139 135 - 145 mmol/L   Potassium 3.5 3.5 - 5.1 mmol/L   Chloride 106 101 - 111 mmol/L   CO2 22 22 - 32 mmol/L   Glucose, Bld 91 65 - 99 mg/dL   BUN 5 (L) 6 - 20 mg/dL   Creatinine, Ser 0.74 0.61 - 1.24 mg/dL   Calcium 9.4 8.9 - 10.3 mg/dL   GFR calc non Af Amer >60 >60 mL/min   GFR calc Af Amer >60 >60 mL/min    Comment: (NOTE) The eGFR has been calculated using the CKD EPI equation. This calculation has not been validated in all clinical situations. eGFR's persistently <60 mL/min signify possible Chronic Kidney Disease.    Anion gap 11 5 - 15  CBC     Status: Abnormal   Collection Time:  04/19/16  7:00 AM  Result Value Ref Range   WBC 11.3 (H) 4.0 - 10.5 K/uL   RBC 5.29 4.22 - 5.81 MIL/uL   Hemoglobin 15.6 13.0 - 17.0 g/dL   HCT 46.0 39.0 - 52.0 %   MCV 87.0 78.0 - 100.0 fL   MCH 29.5 26.0 - 34.0 pg   MCHC 33.9 30.0 - 36.0 g/dL   RDW 13.6 11.5 - 15.5 %   Platelets 217 150 - 400 K/uL  Protime-INR (order if Patient is taking Coumadin / Warfarin)     Status: None   Collection Time: 04/19/16  7:00 AM  Result Value Ref Range   Prothrombin Time  13.3 11.4 - 15.2 seconds   INR 1.01   I-stat troponin, ED     Status: None   Collection Time: 04/19/16  7:06 AM  Result Value Ref Range   Troponin i, poc 0.01 0.00 - 0.08 ng/mL   Comment 3            Comment: Due to the release kinetics of cTnI, a negative result within the first hours of the onset of symptoms does not rule out myocardial infarction with certainty. If myocardial infarction is still suspected, repeat the test at appropriate intervals.   I-stat troponin, ED     Status: None   Collection Time: 04/19/16  1:11 PM  Result Value Ref Range   Troponin i, poc 0.01 0.00 - 0.08 ng/mL   Comment 3            Comment: Due to the release kinetics of cTnI, a negative result within the first hours of the onset of symptoms does not rule out myocardial infarction with certainty. If myocardial infarction is still suspected, repeat the test at appropriate intervals.    Dg Chest 2 View  Result Date: 04/19/2016 CLINICAL DATA:  Chest pain for 2 days on the left side. EXAM: CHEST  2 VIEW COMPARISON:  12/31/2015 FINDINGS: Heart and mediastinal contours are within normal limits. No focal opacities or effusions. No acute bony abnormality. IMPRESSION: No active cardiopulmonary disease. Electronically Signed   By: Rolm Baptise M.D.   On: 04/19/2016 07:37    WEIGHTS: Wt Readings from Last 3 Encounters:  04/20/16 166 lb (75.3 kg)  01/24/16 170 lb 6.4 oz (77.3 kg)  12/24/15 160 lb 15 oz (73 kg)    VITALS: BP (!) 152/90   Pulse  92   Ht '5\' 5"'  (1.651 m)   Wt 166 lb (75.3 kg)   BMI 27.62 kg/m   EXAM: Deferred  EKG: Deferred  ASSESSMENT: 1. Chest pain- recent PCI to the LAD (11/2015) and noted CTO of the RCA with left to right collaterals 2. Hypertension 3. Dyslipidemia 4. Tobacco abuse 5. 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   Mr. Hinkle ruled out for acute MI yesterday in the emergency department and his symptoms were likely due to hypertensive urgency. He's now on increased dose lisinopril. Unfortunately he only took his normal dose this morning. I advised him to take additional lisinopril and increase the dose to 20 mg daily. He will need a blood pressure follow-up in 2-3 weeks with our pharmacist. He's pain is better now after taking some Aleve suggesting musculoskeletal component. He also reports worsening ambulation related to polio and weakness in his legs. He wants to see about possible bracing options. I like to refer him to Dr. Letta Pate, with physical medicine rehabilitation. Follow-up with me 6 months.  Pixie Casino, MD, [facility] Attending Cardiologist [place] C Yarenis Cerino 04/20/2016, 10:31 AM

## 2016-04-20 NOTE — Patient Instructions (Addendum)
Medication Instructions:  INCREASE Lisinopril to 20mg  Once a day--START TOMORROW  Labwork: None   Testing/Procedures: None   Follow-Up: Your physician recommends that you schedule a follow-up appointment in: 3 WEEKS WITH HYPERTENSION CLINIC   Your physician wants you to follow-up in: 6 MONTHS WITH DR HILTY. You will receive a reminder letter in the mail two months in advance. If you don't receive a letter, please call our office to schedule the follow-up appointment.  Any Other Special Instructions Will Be Listed Below (If Applicable).  Tonight take a extra Lisinopril 10mg  tonight and TOMORROW start Lisinopril 20mg  Once a day  You have been referred to DR Claudette LawsANDREW KIRSTEINS FOR POLIO AND POSSIBLE BRACES FOR LEGS FOR DIFFICULTY WALKING   If you need a refill on your cardiac medications before your next appointment, please call your pharmacy.

## 2016-04-29 ENCOUNTER — Ambulatory Visit: Payer: Medicaid Other | Admitting: Internal Medicine

## 2016-05-14 ENCOUNTER — Ambulatory Visit: Payer: Medicaid Other

## 2016-05-14 NOTE — Progress Notes (Deleted)
Patient ID: John Mcintyre                 DOB: 09/14/1970                      MRN: [id number]     HPI: John Garnetasih Haberman is a 45 y.o. male referred by Dr. Rennis GoldenHilty  to HTN clinic.  PMI positive for chest pain with PCI, CAD, hypertension, dyslipidemia, polio, and tobacco abuse.    Current HTN meds: lisinopril 20mg  ??, metoprolol 25mg  BID??,   Previously tried:    BP goal: 130/80  Family History: Father - heart failure; mother - hypertesion  Social History: Smoke ??; alcohol ??; illicit drugs ??  Diet:   Exercise:   Home BP readings:   Wt Readings from Last 3 Encounters:  04/20/16 166 lb (75.3 kg)  01/24/16 170 lb 6.4 oz (77.3 kg)  12/24/15 160 lb 15 oz (73 kg)   BP Readings from Last 3 Encounters:  04/20/16 (!) 152/90  04/19/16 155/99  01/24/16 132/88   Pulse Readings from Last 3 Encounters:  04/20/16 92  04/19/16 68  01/24/16 83    Past Medical History:  Diagnosis Date  . Coronary artery disease   . Hypercholesteremia   . Hypertension   . Polio     Current Outpatient Prescriptions on File Prior to Visit  Medication Sig Dispense Refill  . aspirin EC 81 MG tablet Take 81 mg by mouth daily.    . clopidogrel (PLAVIX) 75 MG tablet Take 1 tablet (75 mg total) by mouth daily with breakfast. 90 tablet 3  . lisinopril (PRINIVIL,ZESTRIL) 20 MG tablet Take 1 tablet (20 mg total) by mouth daily. 30 tablet 6  . lovastatin (MEVACOR) 20 MG tablet TAKE 1 TABLET (20 MG TOTAL) BY MOUTH AT BEDTIME. 30 tablet 6  . metoprolol tartrate (LOPRESSOR) 25 MG tablet Take 1 tablet (25 mg total) by mouth 2 (two) times daily. 60 tablet 3  . nitroGLYCERIN (NITROSTAT) 0.4 MG SL tablet Place 1 tablet (0.4 mg total) under the tongue every 5 (five) minutes as needed for chest pain. 90 tablet 3  . omega-3 acid ethyl esters (LOVAZA) 1 g capsule Take 1 g by mouth 3 (three) times daily.     No current facility-administered medications on file prior to visit.     Allergies  Allergen Reactions  .  Pork-Derived Products     Patient does not eat pork because of his culture     Assessment/Plan:  1. Hypertension - BMET and CBC on 04/19/2016

## 2016-06-04 ENCOUNTER — Telehealth: Payer: Self-pay | Admitting: Internal Medicine

## 2016-06-04 NOTE — Telephone Encounter (Signed)
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

## 2016-06-05 ENCOUNTER — Telehealth (HOSPITAL_COMMUNITY): Payer: Self-pay

## 2016-06-05 NOTE — Telephone Encounter (Signed)
Reference # [id number] Referral for Cardiac Rehab closed per Dr. Rennis GoldenHilty patient has difficulty walking and uses crutches for Postpolio Syndrome - Cardiac Rehab is not appropriate.

## 2016-06-20 ENCOUNTER — Encounter (HOSPITAL_COMMUNITY): Payer: Self-pay | Admitting: Nurse Practitioner

## 2016-06-20 ENCOUNTER — Emergency Department (HOSPITAL_COMMUNITY): Payer: Medicaid Other

## 2016-06-20 ENCOUNTER — Emergency Department (HOSPITAL_COMMUNITY)
Admission: EM | Admit: 2016-06-20 | Discharge: 2016-06-20 | Disposition: A | Discharge status: Home / Self Care | Payer: Medicaid Other | Attending: Emergency Medicine | Admitting: Emergency Medicine

## 2016-06-20 DIAGNOSIS — Z955 Presence of coronary angioplasty implant and graft: Secondary | ICD-10-CM | POA: Insufficient documentation

## 2016-06-20 DIAGNOSIS — F17210 Nicotine dependence, cigarettes, uncomplicated: Secondary | ICD-10-CM | POA: Insufficient documentation

## 2016-06-20 DIAGNOSIS — J111 Influenza due to unidentified influenza virus with other respiratory manifestations: Secondary | ICD-10-CM | POA: Insufficient documentation

## 2016-06-20 DIAGNOSIS — R05 Cough: Secondary | ICD-10-CM | POA: Diagnosis present

## 2016-06-20 DIAGNOSIS — I2510 Atherosclerotic heart disease of native coronary artery without angina pectoris: Secondary | ICD-10-CM | POA: Insufficient documentation

## 2016-06-20 DIAGNOSIS — Z7982 Long term (current) use of aspirin: Secondary | ICD-10-CM | POA: Insufficient documentation

## 2016-06-20 DIAGNOSIS — I10 Essential (primary) hypertension: Secondary | ICD-10-CM | POA: Insufficient documentation

## 2016-06-20 LAB — INFLUENZA PANEL BY PCR (TYPE A & B)
INFLBPCR: NEGATIVE
Influenza A By PCR: POSITIVE — AB

## 2016-06-20 LAB — URINALYSIS, ROUTINE W REFLEX MICROSCOPIC
Bilirubin Urine: NEGATIVE
GLUCOSE, UA: NEGATIVE mg/dL
Hgb urine dipstick: NEGATIVE
KETONES UR: 5 mg/dL — AB
Leukocytes, UA: NEGATIVE
NITRITE: NEGATIVE
PROTEIN: NEGATIVE mg/dL
Specific Gravity, Urine: 1.01 (ref 1.005–1.030)
pH: 5 (ref 5.0–8.0)

## 2016-06-20 LAB — BASIC METABOLIC PANEL
Anion gap: 11 (ref 5–15)
BUN: 11 mg/dL (ref 6–20)
CHLORIDE: 100 mmol/L — AB (ref 101–111)
CO2: 21 mmol/L — ABNORMAL LOW (ref 22–32)
CREATININE: 0.99 mg/dL (ref 0.61–1.24)
Calcium: 9 mg/dL (ref 8.9–10.3)
Glucose, Bld: 119 mg/dL — ABNORMAL HIGH (ref 65–99)
POTASSIUM: 3.3 mmol/L — AB (ref 3.5–5.1)
SODIUM: 132 mmol/L — AB (ref 135–145)

## 2016-06-20 LAB — CBC WITH DIFFERENTIAL/PLATELET
BASOS PCT: 0 %
Basophils Absolute: 0 10*3/uL (ref 0.0–0.1)
EOS ABS: 0 10*3/uL (ref 0.0–0.7)
EOS PCT: 0 %
HCT: 44.8 % (ref 39.0–52.0)
HEMOGLOBIN: 15.5 g/dL (ref 13.0–17.0)
LYMPHS ABS: 2.2 10*3/uL (ref 0.7–4.0)
Lymphocytes Relative: 15 %
MCH: 29.5 pg (ref 26.0–34.0)
MCHC: 34.6 g/dL (ref 30.0–36.0)
MCV: 85.2 fL (ref 78.0–100.0)
Monocytes Absolute: 1.4 10*3/uL — ABNORMAL HIGH (ref 0.1–1.0)
Monocytes Relative: 9 %
NEUTROS PCT: 76 %
Neutro Abs: 11.5 10*3/uL — ABNORMAL HIGH (ref 1.7–7.7)
PLATELETS: 165 10*3/uL (ref 150–400)
RBC: 5.26 MIL/uL (ref 4.22–5.81)
RDW: 14.8 % (ref 11.5–15.5)
WBC: 15.2 10*3/uL — AB (ref 4.0–10.5)

## 2016-06-20 LAB — I-STAT TROPONIN, ED: TROPONIN I, POC: 0.01 ng/mL (ref 0.00–0.08)

## 2016-06-20 LAB — I-STAT CG4 LACTIC ACID, ED: Lactic Acid, Venous: 1.06 mmol/L (ref 0.5–1.9)

## 2016-06-20 MED ORDER — POTASSIUM CHLORIDE CRYS ER 20 MEQ PO TBCR
40.0000 meq | EXTENDED_RELEASE_TABLET | Freq: Once | ORAL | Status: AC
  Administered 2016-06-20: 40 meq via ORAL
  Filled 2016-06-20: qty 2

## 2016-06-20 MED ORDER — ONDANSETRON HCL 4 MG/2ML IJ SOLN
4.0000 mg | Freq: Once | INTRAMUSCULAR | Status: AC
  Administered 2016-06-20: 4 mg via INTRAVENOUS
  Filled 2016-06-20: qty 2

## 2016-06-20 MED ORDER — BENZONATATE 100 MG PO CAPS: 200.0000 mg | ORAL_CAPSULE | Freq: Two times a day (BID) | ORAL | 0 refills | Status: DC | PRN

## 2016-06-20 MED ORDER — OSELTAMIVIR PHOSPHATE 75 MG PO CAPS: 75.0000 mg | ORAL_CAPSULE | Freq: Two times a day (BID) | ORAL | 0 refills | Status: DC

## 2016-06-20 MED ORDER — SODIUM CHLORIDE 0.9 % IV BOLUS (SEPSIS)
1000.0000 mL | Freq: Once | INTRAVENOUS | Status: AC
  Administered 2016-06-20: 1000 mL via INTRAVENOUS

## 2016-06-20 MED ORDER — OXYMETAZOLINE HCL 0.05 % NA SOLN: 1.0000 | Freq: Two times a day (BID) | NASAL | 0 refills | Status: DC

## 2016-06-20 MED ORDER — ONDANSETRON 4 MG PO TBDP: 4.0000 mg | ORAL_TABLET | Freq: Three times a day (TID) | ORAL | 0 refills | Status: DC | PRN

## 2016-06-20 MED ORDER — ACETAMINOPHEN 500 MG PO TABS
1000.0000 mg | ORAL_TABLET | Freq: Once | ORAL | Status: AC
  Administered 2016-06-20: 1000 mg via ORAL
  Filled 2016-06-20: qty 2

## 2016-06-20 NOTE — ED Triage Notes (Addendum)
Pt c/o cough, myalgia, fatigue, chest soreness when he coughs, onset 3 days ago. Received 1000 mg of Tylenol from medics en route. States he hasn't slept in 3 days and needs a sleep aid.

## 2016-06-20 NOTE — ED Provider Notes (Signed)
[facility]-EMERGENCY DEPT Provider Note   CSN: [id number] Arrival date & time: 06/20/16  0004     History   Chief Complaint Chief Complaint  Patient presents with  . Flu-Like Symptoms    Cough, Malaise, Myalgia    HPI John Mcintyre is a 46 y.o. male.  HPI   Patient is a 46 year old male with history of hypertension who presents the ED with complaint of flulike symptoms, onset 2 days. Pt reports having worsening productive cough, generalized body aches, chills, fever, fatigue, nausea, decreased appetite and nasal congestion over the past 3 days. Denies taking any medications at home. Patient received thousand milligrams of Tylenol by EMS in route. Denies any known sick contacts. Denies headache, neck stiffness, rash, sore throat, hemoptysis, shortness of breath, wheezing, abdominal pain, vomiting, diarrhea, urinary symptoms.   Past Medical History:  Diagnosis Date  . Coronary artery disease   . Hypercholesteremia   . Hypertension   . Polio     Patient Active Problem List   Diagnosis Date Noted  . Hypertensive urgency 04/19/2016  . CAD S/P LAD DES 12/23/15 12/23/2015  . Smoker 12/23/2015  . Essential hypertension 10/11/2014  . Hypercholesteremia 10/11/2014  . Chest pain 10/11/2014  . Polio     Past Surgical History:  Procedure Laterality Date  . CARDIAC CATHETERIZATION N/A 12/23/2015   Procedure: Left Heart Cath and Coronary Angiography;  Surgeon: Kathleene Hazel, MD;  Location: [facility] INVASIVE CV LAB;  Service: Cardiovascular;  Laterality: N/A;  . CARDIAC CATHETERIZATION N/A 12/23/2015   Procedure: Coronary Stent Intervention;  Surgeon: Kathleene Hazel, MD;  Location: [facility] INVASIVE CV LAB;  Service: Cardiovascular;  Laterality: N/A;  . CORONARY STENT PLACEMENT  12/23/2015   Successful PTCA/DES x 1 proximal LAD  . HIP SURGERY Left 1987   for polio, not sure what was done  . KNEE SURGERY Left 1987   for polio       Home Medications    Prior to Admission  medications   Medication Sig Start Date End Date Taking? Authorizing Provider  aspirin EC 81 MG tablet Take 81 mg by mouth daily.   Yes Historical Provider, MD  clopidogrel (PLAVIX) 75 MG tablet Take 1 tablet (75 mg total) by mouth daily with breakfast. 12/24/15  Yes Eda Paschal Kilroy, PA-C  lisinopril (PRINIVIL,ZESTRIL) 20 MG tablet Take 1 tablet (20 mg total) by mouth daily. 04/20/16  Yes Chrystie Nose, MD  lovastatin (MEVACOR) 20 MG tablet TAKE 1 TABLET (20 MG TOTAL) BY MOUTH AT BEDTIME. 11/08/15  Yes Chrystie Nose, MD  metoprolol tartrate (LOPRESSOR) 25 MG tablet Take 1 tablet (25 mg total) by mouth 2 (two) times daily. 12/18/15  Yes Rosalio Macadamia, NP  nitroGLYCERIN (NITROSTAT) 0.4 MG SL tablet Place 1 tablet (0.4 mg total) under the tongue every 5 (five) minutes as needed for chest pain. 12/18/15  Yes Rosalio Macadamia, NP  omega-3 acid ethyl esters (LOVAZA) 1 g capsule Take 1 g by mouth 3 (three) times daily.   Yes Historical Provider, MD  benzonatate (TESSALON) 100 MG capsule Take 2 capsules (200 mg total) by mouth 2 (two) times daily as needed for cough. 06/20/16   Barrett Henle, PA-C  ondansetron (ZOFRAN ODT) 4 MG disintegrating tablet Take 1 tablet (4 mg total) by mouth every 8 (eight) hours as needed for nausea or vomiting. 06/20/16   Barrett Henle, PA-C  oseltamivir (TAMIFLU) 75 MG capsule Take 1 capsule (75 mg total) by mouth every 12 (twelve) hours.  06/20/16   Barrett HenleNicole Elizabeth Marlyn Rabine, PA-C  oxymetazoline (AFRIN NASAL SPRAY) 0.05 % nasal spray Place 1 spray into both nostrils 2 (two) times daily. Spray once into each nostril twice daily for up to the next 3 days. Do not use for more than 3 days to prevent rebound rhinorrhea. 06/20/16   Barrett HenleNicole Elizabeth Bryndan Bilyk, PA-C    Family History Family History  Problem Relation Age of Onset  . Hypertension Mother   . Heart failure Father     Social History Social History  Substance Use Topics  . Smoking status: Current Every Day  Smoker    Packs/day: 0.00    Types: Cigarettes  . Smokeless tobacco: Never Used  . Alcohol use Yes     Comment: occ     Allergies   Pork-derived products   Review of Systems Review of Systems  Constitutional: Positive for appetite change (decreased), chills, fatigue and fever.  HENT: Positive for congestion.   Respiratory: Positive for cough.   Gastrointestinal: Positive for nausea.  Musculoskeletal: Positive for myalgias (generalized).  All other systems reviewed and are negative.    Physical Exam Updated Vital Signs BP 113/61 (BP Location: Right Arm)   Pulse 71   Temp 97.9 F (36.6 C) (Oral)   Resp 18   Ht 5\' 5"  (1.651 m)   Wt 72.6 kg   SpO2 98%   BMI 26.63 kg/m   Physical Exam  Constitutional: He is oriented to person, place, and time. He appears well-developed and well-nourished. He appears ill.  Pt clammy with pillow case wet from pt's sweat  HENT:  Head: Normocephalic and atraumatic.  Mouth/Throat: Uvula is midline, oropharynx is clear and moist and mucous membranes are normal. No oropharyngeal exudate, posterior oropharyngeal edema, posterior oropharyngeal erythema or tonsillar abscesses. No tonsillar exudate.  Eyes: Conjunctivae and EOM are normal. Pupils are equal, round, and reactive to light. Right eye exhibits no discharge. Left eye exhibits no discharge. No scleral icterus.  Neck: Normal range of motion. Neck supple.  Cardiovascular: Normal rate, regular rhythm, normal heart sounds and intact distal pulses.   HR 94  Pulmonary/Chest: Effort normal and breath sounds normal. No respiratory distress. He has no wheezes. He has no rales. He exhibits no tenderness.  Abdominal: Soft. Bowel sounds are normal. He exhibits no distension and no mass. There is no tenderness. There is no rebound and no guarding. No hernia.  Musculoskeletal: Normal range of motion. He exhibits no edema.  Lymphadenopathy:    He has no cervical adenopathy.  Neurological: He is alert  and oriented to person, place, and time.  Skin: Skin is warm and dry. No rash noted.  Nursing note and vitals reviewed.    ED Treatments / Results  Labs (all labs ordered are listed, but only abnormal results are displayed) Labs Reviewed  CBC WITH DIFFERENTIAL/PLATELET - Abnormal; Notable for the following:       Result Value   WBC 15.2 (*)    Neutro Abs 11.5 (*)    Monocytes Absolute 1.4 (*)    All other components within normal limits  BASIC METABOLIC PANEL - Abnormal; Notable for the following:    Sodium 132 (*)    Potassium 3.3 (*)    Chloride 100 (*)    CO2 21 (*)    Glucose, Bld 119 (*)    All other components within normal limits  INFLUENZA PANEL BY PCR (TYPE A & B) - Abnormal; Notable for the following:    Influenza A By  PCR POSITIVE (*)    All other components within normal limits  URINALYSIS, ROUTINE W REFLEX MICROSCOPIC - Abnormal; Notable for the following:    APPearance HAZY (*)    Ketones, ur 5 (*)    All other components within normal limits  I-STAT CG4 LACTIC ACID, ED  Rosezena Sensor, ED    EKG  EKG Interpretation None       Radiology Dg Chest 2 View  Result Date: 06/20/2016 CLINICAL DATA:  46 y/o  M; cough. EXAM: CHEST  2 VIEW COMPARISON:  04/19/2016 chest radiograph. FINDINGS: Stable cardiac silhouette within normal limits. Mild bronchitic markings in the lung bases. No focal consolidation. Dextrocurvature of thoracic spine. No pleural effusion or pneumothorax. IMPRESSION: Mild bronchitic changes.  No focal consolidation. Electronically Signed   By: Mitzi Hansen M.D.   On: 06/20/2016 01:23    Procedures Procedures (including critical care time)  Medications Ordered in ED Medications  potassium chloride SA (K-DUR,KLOR-CON) CR tablet 40 mEq (not administered)  sodium chloride 0.9 % bolus 1,000 mL (0 mLs Intravenous Stopped 06/20/16 0404)  acetaminophen (TYLENOL) tablet 1,000 mg (1,000 mg Oral Given 06/20/16 0250)  ondansetron (ZOFRAN)  injection 4 mg (4 mg Intravenous Given 06/20/16 0250)  sodium chloride 0.9 % bolus 1,000 mL (0 mLs Intravenous Stopped 06/20/16 0552)     Initial Impression / Assessment and Plan / ED Course  I have reviewed the triage vital signs and the nursing notes.  Pertinent labs & imaging results that were available during my care of the patient were reviewed by me and considered in my medical decision making (see chart for details).     Patient presents with flulike symptoms. Initial vitals in triage showed temp 103.1, HR 117, remaining vitals stable.  On exam patient is ill-appearing with clammy skin. During my evaluation, palpated heart rate 94, recheck temp 97.6. Lungs clear to auscultation bilaterally. Abdominal exam benign. Moist mucous membranes. Remaining exam unremarkable. Patient given IV fluids, Zofran and Tylenol. UA positive for ketones, no signs of infection. Influenza A positive. Lactic acid 1.06. WBC 15. K 3.3, pt given oral potassium supplement. CXR without consolidation. On reevaluation, pt appears much improved and reports significant improvement of sxs s/p 2L IVF. Pt able to tolerate PO. Plan to d/c pt home with Tamiflu as sxs within 48 hour time-frame and symptomatic tx including antitussive and decongestant. Advised patient to continue taking Tylenol and ibuprofen at home for fever and bodyaches. Discussed importance of continued oral rehydration at home. Advised patient to follow up with his PCP in 3 days. Discussed strict return precautions with patient. Patient hemodynamically stable prior to d/c. Patient reports understanding agreement with plan.  Final Clinical Impressions(s) / ED Diagnoses   Final diagnoses:  Influenza    New Prescriptions New Prescriptions   BENZONATATE (TESSALON) 100 MG CAPSULE    Take 2 capsules (200 mg total) by mouth 2 (two) times daily as needed for cough.   ONDANSETRON (ZOFRAN ODT) 4 MG DISINTEGRATING TABLET    Take 1 tablet (4 mg total) by mouth every 8  (eight) hours as needed for nausea or vomiting.   OSELTAMIVIR (TAMIFLU) 75 MG CAPSULE    Take 1 capsule (75 mg total) by mouth every 12 (twelve) hours.   OXYMETAZOLINE (AFRIN NASAL SPRAY) 0.05 % NASAL SPRAY    Place 1 spray into both nostrils 2 (two) times daily. Spray once into each nostril twice daily for up to the next 3 days. Do not use for more than 3  days to prevent rebound rhinorrhea.     Satira Sark Mims, [place] 06/20/16 1610    Dione Booze, MD 06/20/16 [id number]

## 2016-06-20 NOTE — Discharge Instructions (Signed)
Take your medications as prescribed. I also recommend continuing to take Tylenol and ibuprofen as prescribed over-the-counter for fever and body aches, alternating between doses every 3-4 hours. Continue taking fluids at home to remain hydrated. Please follow up with a primary care provider from the Resource Guide provided below in 3-4 days as needed. Please return to the Emergency Department if symptoms worsen or new onset of rash, neck stiffness, chest pain, difficulty breathing, vomiting, unable to keep fluids down, abdominal pain.

## 2016-06-25 ENCOUNTER — Other Ambulatory Visit: Payer: Self-pay | Admitting: Nurse Practitioner

## 2016-06-25 ENCOUNTER — Other Ambulatory Visit: Payer: Self-pay | Admitting: Internal Medicine

## 2016-07-29 ENCOUNTER — Encounter (HOSPITAL_COMMUNITY): Payer: Self-pay | Admitting: Emergency Medicine

## 2016-07-29 ENCOUNTER — Emergency Department (HOSPITAL_COMMUNITY): Payer: Medicaid Other

## 2016-07-29 ENCOUNTER — Emergency Department (HOSPITAL_COMMUNITY)
Admission: EM | Admit: 2016-07-29 | Discharge: 2016-07-29 | Disposition: A | Discharge status: Home / Self Care | Payer: Medicaid Other | Attending: Emergency Medicine | Admitting: Emergency Medicine

## 2016-07-29 DIAGNOSIS — Z79899 Other long term (current) drug therapy: Secondary | ICD-10-CM | POA: Diagnosis not present

## 2016-07-29 DIAGNOSIS — I10 Essential (primary) hypertension: Secondary | ICD-10-CM | POA: Diagnosis not present

## 2016-07-29 DIAGNOSIS — F17210 Nicotine dependence, cigarettes, uncomplicated: Secondary | ICD-10-CM | POA: Diagnosis not present

## 2016-07-29 DIAGNOSIS — R079 Chest pain, unspecified: Secondary | ICD-10-CM | POA: Diagnosis not present

## 2016-07-29 DIAGNOSIS — Z7982 Long term (current) use of aspirin: Secondary | ICD-10-CM | POA: Diagnosis not present

## 2016-07-29 DIAGNOSIS — Z7902 Long term (current) use of antithrombotics/antiplatelets: Secondary | ICD-10-CM | POA: Insufficient documentation

## 2016-07-29 DIAGNOSIS — Z955 Presence of coronary angioplasty implant and graft: Secondary | ICD-10-CM | POA: Diagnosis not present

## 2016-07-29 DIAGNOSIS — I2510 Atherosclerotic heart disease of native coronary artery without angina pectoris: Secondary | ICD-10-CM | POA: Diagnosis not present

## 2016-07-29 LAB — I-STAT TROPONIN, ED: TROPONIN I, POC: 0 ng/mL (ref 0.00–0.08)

## 2016-07-29 LAB — CBC
HCT: 46.8 % (ref 39.0–52.0)
Hemoglobin: 15.7 g/dL (ref 13.0–17.0)
MCH: 29.7 pg (ref 26.0–34.0)
MCHC: 33.5 g/dL (ref 30.0–36.0)
MCV: 88.5 fL (ref 78.0–100.0)
PLATELETS: 237 10*3/uL (ref 150–400)
RBC: 5.29 MIL/uL (ref 4.22–5.81)
RDW: 15.2 % (ref 11.5–15.5)
WBC: 12.5 10*3/uL — AB (ref 4.0–10.5)

## 2016-07-29 LAB — BASIC METABOLIC PANEL
Anion gap: 10 (ref 5–15)
BUN: 7 mg/dL (ref 6–20)
CALCIUM: 9.8 mg/dL (ref 8.9–10.3)
CO2: 25 mmol/L (ref 22–32)
Chloride: 104 mmol/L (ref 101–111)
Creatinine, Ser: 0.69 mg/dL (ref 0.61–1.24)
Glucose, Bld: 124 mg/dL — ABNORMAL HIGH (ref 65–99)
Potassium: 3.8 mmol/L (ref 3.5–5.1)
SODIUM: 139 mmol/L (ref 135–145)

## 2016-07-29 LAB — TROPONIN I

## 2016-07-29 MED ORDER — TRAMADOL HCL 50 MG PO TABS: 50.0000 mg | ORAL_TABLET | Freq: Four times a day (QID) | ORAL | 0 refills | Status: DC | PRN

## 2016-07-29 NOTE — Discharge Instructions (Signed)
You should follow up with your heart doctor for recheck of current symptoms even though they are not similar to previous heart condition. Return here if symptoms worsen. Continue your current medication as prescribed.

## 2016-07-29 NOTE — ED Triage Notes (Signed)
Pt presents to ED for assessment of left sided chest, shoulder and hand pain x 2 weeks.  Pt sts he had polio and now walks with crutches, thought it might musculoskeletal, but became concerned because he smokes 2 packs a day of cigarettes.  Patient denies any other associated symptoms.

## 2016-07-29 NOTE — ED Provider Notes (Signed)
[facility]-EMERGENCY DEPT Provider Note   CSN: [id number] Arrival date & time: 07/29/16  0143     History   Chief Complaint Chief Complaint  Patient presents with  . Chest Pain  . Shoulder Pain  . Hand Pain    HPI John Mcintyre is a 46 y.o. male.  Patient presents with chest discomfort - states it is not pain - in the left chest and left arm. Symptoms have been waxing and waning x 10 days without complete resolution at any point. He denies SOB, aggravating or alleviating factors, fever or cough. He complains of bilateral shoulder soreness but reports this is chronic secondary to being dependent on crutches to ambulate as he has polio. No nausea, vomiting. No diaphoresis.   The history is provided by the patient. No language interpreter was used.    Past Medical History:  Diagnosis Date  . Coronary artery disease   . Hypercholesteremia   . Hypertension   . Polio     Patient Active Problem List   Diagnosis Date Noted  . Hypertensive urgency 04/19/2016  . CAD S/P LAD DES 12/23/15 12/23/2015  . Smoker 12/23/2015  . Essential hypertension 10/11/2014  . Hypercholesteremia 10/11/2014  . Chest pain 10/11/2014  . Polio     Past Surgical History:  Procedure Laterality Date  . CARDIAC CATHETERIZATION N/A 12/23/2015   Procedure: Left Heart Cath and Coronary Angiography;  Surgeon: Kathleene Hazel, MD;  Location: [facility] INVASIVE CV LAB;  Service: Cardiovascular;  Laterality: N/A;  . CARDIAC CATHETERIZATION N/A 12/23/2015   Procedure: Coronary Stent Intervention;  Surgeon: Kathleene Hazel, MD;  Location: [facility] INVASIVE CV LAB;  Service: Cardiovascular;  Laterality: N/A;  . CORONARY STENT PLACEMENT  12/23/2015   Successful PTCA/DES x 1 proximal LAD  . HIP SURGERY Left 1987   for polio, not sure what was done  . KNEE SURGERY Left 1987   for polio       Home Medications    Prior to Admission medications   Medication Sig Start Date End Date Taking? Authorizing Provider    aspirin EC 81 MG tablet Take 81 mg by mouth daily.   Yes Historical Provider, MD  clopidogrel (PLAVIX) 75 MG tablet Take 1 tablet (75 mg total) by mouth daily with breakfast. 12/24/15  Yes Eda Paschal Kilroy, PA-C  lisinopril (PRINIVIL,ZESTRIL) 20 MG tablet Take 1 tablet (20 mg total) by mouth daily. 04/20/16  Yes Chrystie Nose, MD  lovastatin (MEVACOR) 20 MG tablet TAKE 1 TABLET (20 MG TOTAL) BY MOUTH AT BEDTIME. 06/26/16  Yes Chrystie Nose, MD  metoprolol tartrate (LOPRESSOR) 25 MG tablet TAKE 1 TABLET BY MOUTH TWICE A DAY 06/26/16  Yes Rosalio Macadamia, NP  benzonatate (TESSALON) 100 MG capsule Take 2 capsules (200 mg total) by mouth 2 (two) times daily as needed for cough. Patient not taking: Reported on 07/29/2016 06/20/16   Barrett Henle, PA-C  nitroGLYCERIN (NITROSTAT) 0.4 MG SL tablet Place 1 tablet (0.4 mg total) under the tongue every 5 (five) minutes as needed for chest pain. 12/18/15   Rosalio Macadamia, NP  ondansetron (ZOFRAN ODT) 4 MG disintegrating tablet Take 1 tablet (4 mg total) by mouth every 8 (eight) hours as needed for nausea or vomiting. Patient not taking: Reported on 07/29/2016 06/20/16   Barrett Henle, PA-C  oseltamivir (TAMIFLU) 75 MG capsule Take 1 capsule (75 mg total) by mouth every 12 (twelve) hours. Patient not taking: Reported on 07/29/2016 06/20/16   Barrett Henle,  PA-C  oxymetazoline (AFRIN NASAL SPRAY) 0.05 % nasal spray Place 1 spray into both nostrils 2 (two) times daily. Spray once into each nostril twice daily for up to the next 3 days. Do not use for more than 3 days to prevent rebound rhinorrhea. Patient not taking: Reported on 07/29/2016 06/20/16   Barrett Henle, PA-C    Family History Family History  Problem Relation Age of Onset  . Hypertension Mother   . Heart failure Father     Social History Social History  Substance Use Topics  . Smoking status: Current Every Day Smoker    Packs/day: 2.00    Types: Cigarettes  .  Smokeless tobacco: Never Used  . Alcohol use Yes     Comment: occ     Allergies   Pork-derived products   Review of Systems Review of Systems  Constitutional: Negative for chills, diaphoresis and fever.  Respiratory: Negative.  Negative for shortness of breath.   Cardiovascular: Positive for chest pain. Negative for leg swelling.  Gastrointestinal: Negative.  Negative for abdominal pain and nausea.  Musculoskeletal:       See HPI.  Skin: Negative.   Neurological: Negative.  Negative for weakness and light-headedness.     Physical Exam Updated Vital Signs BP 115/86   Pulse 85   Temp 98.2 F (36.8 C) (Oral)   Resp 16   Ht 5\' 5"  (1.651 m)   Wt 73.5 kg   SpO2 98%   BMI 26.96 kg/m   Physical Exam  Constitutional: He is oriented to person, place, and time. He appears well-developed and well-nourished.  HENT:  Head: Normocephalic.  Neck: Normal range of motion. Neck supple.  Cardiovascular: Normal rate, regular rhythm and intact distal pulses.   No murmur heard. Pulmonary/Chest: Effort normal and breath sounds normal. He has no wheezes. He has no rales. He exhibits no tenderness.  Abdominal: Soft. Bowel sounds are normal. There is no tenderness. There is no rebound and no guarding.  Musculoskeletal: Normal range of motion.  Atrophied LE's c/w history of polio. Tenderness to bilateral trapezius without swelling.   Neurological: He is alert and oriented to person, place, and time.  Skin: Skin is warm and dry. No rash noted.  Psychiatric: He has a normal mood and affect.     ED Treatments / Results  Labs (all labs ordered are listed, but only abnormal results are displayed) Labs Reviewed  BASIC METABOLIC PANEL - Abnormal; Notable for the following:       Result Value   Glucose, Bld 124 (*)    All other components within normal limits  CBC - Abnormal; Notable for the following:    WBC 12.5 (*)    All other components within normal limits  I-STAT TROPOININ, ED    Results for orders placed or performed during the hospital encounter of 07/29/16  Basic metabolic panel  Result Value Ref Range   Sodium 139 135 - 145 mmol/L   Potassium 3.8 3.5 - 5.1 mmol/L   Chloride 104 101 - 111 mmol/L   CO2 25 22 - 32 mmol/L   Glucose, Bld 124 (H) 65 - 99 mg/dL   BUN 7 6 - 20 mg/dL   Creatinine, Ser 1.61 0.61 - 1.24 mg/dL   Calcium 9.8 8.9 - 09.6 mg/dL   GFR calc non Af Amer >60 >60 mL/min   GFR calc Af Amer >60 >60 mL/min   Anion gap 10 5 - 15  CBC  Result Value Ref Range  WBC 12.5 (H) 4.0 - 10.5 K/uL   RBC 5.29 4.22 - 5.81 MIL/uL   Hemoglobin 15.7 13.0 - 17.0 g/dL   HCT 40.946.8 81.139.0 - 91.452.0 %   MCV 88.5 78.0 - 100.0 fL   MCH 29.7 26.0 - 34.0 pg   MCHC 33.5 30.0 - 36.0 g/dL   RDW 78.215.2 95.611.5 - 21.315.5 %   Platelets 237 150 - 400 K/uL  I-stat troponin, ED  Result Value Ref Range   Troponin i, poc 0.00 0.00 - 0.08 ng/mL   Comment 3            EKG  EKG Interpretation None       Radiology Dg Chest 2 View  Result Date: 07/29/2016 CLINICAL DATA:  Acute onset of mid chest pain and left shoulder pain. Initial encounter. EXAM: CHEST  2 VIEW COMPARISON:  Chest radiograph performed 06/20/2016 FINDINGS: The lungs are well-aerated. Mild peribronchial thickening is noted. There is no evidence of focal opacification, pleural effusion or pneumothorax. The heart is normal in size; the mediastinal contour is within normal limits. No acute osseous abnormalities are seen. IMPRESSION: Mild peribronchial thickening noted.  Lungs otherwise grossly clear. Electronically Signed   By: Roanna RaiderJeffery  Chang M.D.   On: 07/29/2016 02:41   Dg Shoulder Left  Result Date: 07/29/2016 CLINICAL DATA:  Acute onset of left shoulder pain. Initial encounter. EXAM: LEFT SHOULDER - 2+ VIEW COMPARISON:  None. FINDINGS: There is no evidence of fracture or dislocation. The left humeral head is seated within the glenoid fossa. The acromioclavicular joint is unremarkable in appearance. No significant soft  tissue abnormalities are seen. The visualized portions of the left lung are clear. IMPRESSION: No evidence of fracture or dislocation. Electronically Signed   By: Roanna RaiderJeffery  Chang M.D.   On: 07/29/2016 02:42    Procedures Procedures (including critical care time)  Medications Ordered in ED Medications - No data to display   Initial Impression / Assessment and Plan / ED Course  I have reviewed the triage vital signs and the nursing notes.  Pertinent labs & imaging results that were available during my care of the patient were reviewed by me and considered in my medical decision making (see chart for details).     Patient with history of CAD s/p LAD DES 2017 presents with chest pain that is dissimilar to pain experienced prior to his catheterization last year. There is no exertional component. EKG unchanged, labs unremarkable. Will bet [place] to complete evaluation  The patient is in NAD, and is very comfortable appearing. VSS. He is felt stable for discharge home with cardiology follow up.  Final Clinical Impressions(s) / ED Diagnoses   Final diagnoses:  None   1. Nonspecific chest pain  New Prescriptions New Prescriptions   No medications on file     Elpidio AnisShari Froilan Mclean, PA-C 07/29/16 [id number]    Layla MawKristen N Ward, DO 07/29/16 [phone]

## 2016-07-29 NOTE — ED Provider Notes (Signed)
Handoff from up still PA-C at shift change. Patient updated on results. Encouraged cardiology follow-up. Rx for tramadol given if pain is controlled at home with Tylenol.  Patient was counseled to return with severe chest pain, especially if the pain is crushing or pressure-like and spreads to the arms, back, neck, or jaw, or if they have sweating, nausea, or shortness of breath with the pain. They were encouraged to call 911 with these symptoms.   They were also told to return if their chest pain gets worse and does not go away with rest, they have an attack of chest pain lasting longer than usual despite rest and treatment with the medications their caregiver has prescribed, if they wake from sleep with chest pain or shortness of breath, if they feel dizzy or faint, if they have chest pain not typical of their usual pain, or if they have any other emergent concerns regarding their health.  Patient counseled on use of narcotic pain medications. Counseled not to combine these medications with others containing tylenol. Urged not to drink alcohol, drive, or perform any other activities that requires focus while taking these medications. The patient verbalizes understanding and agrees with the plan.  BP 114/80   Pulse 68   Temp 98.2 F (36.8 C) (Oral)   Resp 20   Ht 5\' 5"  (1.651 m)   Wt 73.5 kg   SpO2 96%   BMI 26.96 kg/m     Renne CriglerJoshua Tamaya Pun, PA-C 07/29/16 [phone]    Arby BarretteMarcy Pfeiffer, MD 08/06/16 1505

## 2016-08-06 ENCOUNTER — Ambulatory Visit: Payer: Medicaid Other | Admitting: Internal Medicine

## 2016-09-17 ENCOUNTER — Telehealth: Payer: Self-pay | Admitting: Internal Medicine

## 2016-09-17 NOTE — Telephone Encounter (Signed)
Unable to reach patient. Listed mobile was dialed, goes to notification w/ no set up VM box. Home line no answer when dialed.

## 2016-09-17 NOTE — Telephone Encounter (Signed)
New Message    Pt c/o of Chest Pain: STAT if CP now or developed within 24 hours  1. Are you having CP right now? No  2. Are you experiencing any other symptoms (ex. SOB, nausea, vomiting, sweating)? No  3. How long have you been experiencing CP? About a month  4. Is your CP continuous or coming and going? Coming & Going  5. Have you taken Nitroglycerin? Yes, took twice yesterday & once today ?

## 2016-09-18 NOTE — Telephone Encounter (Signed)
Unable to reach pt or leave a message  

## 2016-09-21 NOTE — Telephone Encounter (Signed)
Unable to reach pt or leave a message  

## 2016-10-13 ENCOUNTER — Telehealth: Payer: Self-pay | Admitting: Internal Medicine

## 2016-10-13 NOTE — Telephone Encounter (Addendum)
New message       Pt c/o of Chest Pain: STAT if CP now or developed within 24 hours  1. Are you having CP right now? no 2. Are you experiencing any other symptoms (ex. SOB, nausea, vomiting, sweating)? fatigue  3. How long have you been experiencing CP? Chest discomfort for approx 2 week   4. Is your CP continuous or coming and going? Comes and goes 5. Have you taken Nitroglycerin? Not sure?

## 2016-10-13 NOTE — Telephone Encounter (Signed)
Pt of Dr. Rennis GoldenHilty Seen Nov 2017 Hx CAD, unstable angina   Called additional 2 times, was finally able to reach patient at 5th attempt.  Discussed his symptoms. He voices that he gets intermittent chest pain, states it's "not over the heart, it's around the chest" and seems to differ sometimes where it bothers him. He does voice relief w nitro which he has to take from time to time. He's been having this problem for weeks  - he called approx a month ago w same complaint but we were unable to reach back out to him at that time despite multiple attempts. He does describe some ongoing fatigue (states "I feel lazy") and that this has been occurrent for a while. He is concerned bc he does not get a lot of opportunity to exercise and does not walk a lot daily.  There is perhaps a mild language barrier but patient declined need/interest in an interpreter for our conversation today. He did voice understanding of instructions for use of his nitro, and complaints/concerns were verbalized clearly. Pt aware to go to ED for any new/worsening symptoms esp if not resolved w nitro.  I asked him if it would be reasonable just to get him in for the next available appt w Dr. Rennis GoldenHilty to discuss concerns, and he was enthusiastic about this. I preferred to get him in sooner in case there are some acute issues that are being overlooked or not adequately communicated. He'll come on Thursday (I added him to open 2pm slot - "24 hr acute"). Voiced that his wife will be with him at appt.  Fwd to Dr. Rennis GoldenHilty and Eileen StanfordJenna as [place].

## 2016-10-13 NOTE — Telephone Encounter (Signed)
Thanks Harrold DonathNathan.  Dr. HRexene Edison

## 2016-10-13 NOTE — Telephone Encounter (Signed)
Tried to call back pt and schedule appt,  unable to [employer]-voice mailbox not set up

## 2016-10-13 NOTE — Telephone Encounter (Signed)
Spoke with pt I tried to ask pt symptoms and the symptoms keep changing one time I ask if he is currently having chest pain and then the next he states no the next time and states that this happened 2 weeks ago. When asked if he took his nitro he states that he does not know. I ask if he is having any other symptoms one time he states no then the next he states yes. The only definitive answer I can get from pt is that he feels fatigue and maybe some chest discomfort and states that he does not want to go to the ER. I think that pt is maybe either not understanding my questions/ language barrier. I cannot consistent answers/symptoms from pt. I have asked other nurse to call pt and see if he can help with this patient.

## 2016-10-13 NOTE — Telephone Encounter (Signed)
I have made attempt x2 to reach patient, the call goes directly to auto-notification stating the patient has an inactive voice mail box.

## 2016-10-14 DIAGNOSIS — M2550 Pain in unspecified joint: Secondary | ICD-10-CM | POA: Diagnosis not present

## 2016-10-14 DIAGNOSIS — Z955 Presence of coronary angioplasty implant and graft: Secondary | ICD-10-CM | POA: Diagnosis not present

## 2016-10-14 DIAGNOSIS — I2510 Atherosclerotic heart disease of native coronary artery without angina pectoris: Secondary | ICD-10-CM | POA: Diagnosis not present

## 2016-10-14 DIAGNOSIS — R0789 Other chest pain: Secondary | ICD-10-CM | POA: Insufficient documentation

## 2016-10-14 DIAGNOSIS — M791 Myalgia: Secondary | ICD-10-CM | POA: Insufficient documentation

## 2016-10-14 DIAGNOSIS — I10 Essential (primary) hypertension: Secondary | ICD-10-CM | POA: Diagnosis not present

## 2016-10-14 DIAGNOSIS — Z7982 Long term (current) use of aspirin: Secondary | ICD-10-CM | POA: Diagnosis not present

## 2016-10-14 DIAGNOSIS — Z79899 Other long term (current) drug therapy: Secondary | ICD-10-CM | POA: Insufficient documentation

## 2016-10-14 DIAGNOSIS — Z7902 Long term (current) use of antithrombotics/antiplatelets: Secondary | ICD-10-CM | POA: Diagnosis not present

## 2016-10-14 DIAGNOSIS — R42 Dizziness and giddiness: Secondary | ICD-10-CM | POA: Diagnosis not present

## 2016-10-14 DIAGNOSIS — R531 Weakness: Secondary | ICD-10-CM | POA: Insufficient documentation

## 2016-10-15 ENCOUNTER — Emergency Department (HOSPITAL_COMMUNITY): Payer: Medicaid Other

## 2016-10-15 ENCOUNTER — Encounter (HOSPITAL_COMMUNITY): Payer: Self-pay | Admitting: Emergency Medicine

## 2016-10-15 ENCOUNTER — Emergency Department (HOSPITAL_COMMUNITY)
Admission: EM | Admit: 2016-10-15 | Discharge: 2016-10-15 | Disposition: A | Discharge status: Home / Self Care | Payer: Medicaid Other | Attending: Emergency Medicine | Admitting: Emergency Medicine

## 2016-10-15 ENCOUNTER — Encounter: Payer: Self-pay | Admitting: Internal Medicine

## 2016-10-15 ENCOUNTER — Ambulatory Visit (INDEPENDENT_AMBULATORY_CARE_PROVIDER_SITE_OTHER): Payer: Medicaid Other | Admitting: Internal Medicine

## 2016-10-15 VITALS — BP 108/70 | HR 88 | Ht 65.0 in | Wt 158.4 lb

## 2016-10-15 DIAGNOSIS — M792 Neuralgia and neuritis, unspecified: Secondary | ICD-10-CM | POA: Diagnosis not present

## 2016-10-15 DIAGNOSIS — R0789 Other chest pain: Secondary | ICD-10-CM

## 2016-10-15 DIAGNOSIS — Z720 Tobacco use: Secondary | ICD-10-CM | POA: Diagnosis not present

## 2016-10-15 DIAGNOSIS — I2510 Atherosclerotic heart disease of native coronary artery without angina pectoris: Secondary | ICD-10-CM

## 2016-10-15 DIAGNOSIS — A809 Acute poliomyelitis, unspecified: Secondary | ICD-10-CM | POA: Diagnosis not present

## 2016-10-15 DIAGNOSIS — M2550 Pain in unspecified joint: Secondary | ICD-10-CM

## 2016-10-15 DIAGNOSIS — Z9861 Coronary angioplasty status: Secondary | ICD-10-CM | POA: Diagnosis not present

## 2016-10-15 LAB — CK: CK TOTAL: 169 U/L (ref 49–397)

## 2016-10-15 LAB — CBC
HCT: 45.6 % (ref 39.0–52.0)
Hemoglobin: 16 g/dL (ref 13.0–17.0)
MCH: 31 pg (ref 26.0–34.0)
MCHC: 35.1 g/dL (ref 30.0–36.0)
MCV: 88.4 fL (ref 78.0–100.0)
PLATELETS: 228 10*3/uL (ref 150–400)
RBC: 5.16 MIL/uL (ref 4.22–5.81)
RDW: 13.4 % (ref 11.5–15.5)
WBC: 13 10*3/uL — AB (ref 4.0–10.5)

## 2016-10-15 LAB — BASIC METABOLIC PANEL
Anion gap: 10 (ref 5–15)
BUN: 10 mg/dL (ref 6–20)
CALCIUM: 9.5 mg/dL (ref 8.9–10.3)
CO2: 23 mmol/L (ref 22–32)
CREATININE: 0.73 mg/dL (ref 0.61–1.24)
Chloride: 104 mmol/L (ref 101–111)
GFR calc Af Amer: >60 mL/min (ref >60)
GFR calc non Af Amer: >60 mL/min (ref >60)
Glucose, Bld: 119 mg/dL — ABNORMAL HIGH (ref 65–99)
Potassium: 3.9 mmol/L (ref 3.5–5.1)
SODIUM: 137 mmol/L (ref 135–145)

## 2016-10-15 LAB — I-STAT TROPONIN, ED: TROPONIN I, POC: 0 ng/mL (ref 0.00–0.08)

## 2016-10-15 MED ORDER — PREDNISONE 20 MG PO TABS: 40.0000 mg | ORAL_TABLET | Freq: Every day | ORAL | 0 refills | Status: DC

## 2016-10-15 NOTE — ED Provider Notes (Signed)
[facility]-EMERGENCY DEPT Provider Note   CSN: [id number] Arrival date & time: 10/14/16  2356  By signing my name below, I, Diona Browner, attest that this documentation has been prepared under the direction and in the presence of [employer], PA-C. Electronically Signed: Diona Browner, ED Scribe. 10/15/16. 1:50 AM.  History   Chief Complaint Chief Complaint  Patient presents with  . Chest Pain    HPI John Mcintyre is a 46 y.o. male with a PMHx of CAD, HTN and polio who presents to the Emergency Department complaining of waxing and waning burning CP for the last month. Associated sx include weakness, dizziness, myalgias, and arthralgias. He had a cardiac catheterization in July 2017 and has been continuing to see his cardiologist. Pt denies SOB and sweating. He has experienced these symptoms multiple times in the past. Patient reports follow up with his cardiologist tomorrow.  The history is provided by the patient. No language interpreter was used.    Past Medical History:  Diagnosis Date  . Coronary artery disease   . Hypercholesteremia   . Hypertension   . Polio     Patient Active Problem List   Diagnosis Date Noted  . Hypertensive urgency 04/19/2016  . CAD S/P LAD DES 12/23/15 12/23/2015  . Smoker 12/23/2015  . Essential hypertension 10/11/2014  . Hypercholesteremia 10/11/2014  . Chest pain 10/11/2014  . Polio     Past Surgical History:  Procedure Laterality Date  . CARDIAC CATHETERIZATION N/A 12/23/2015   Procedure: Left Heart Cath and Coronary Angiography;  Surgeon: Kathleene Hazel, MD;  [facility]: [facility] INVASIVE CV LAB;  Service: Cardiovascular;  Laterality: N/A;  . CARDIAC CATHETERIZATION N/A 12/23/2015   Procedure: Coronary Stent Intervention;  Surgeon: Kathleene Hazel, MD;  Location: [facility] INVASIVE CV LAB;  Service: Cardiovascular;  Laterality: N/A;  . CORONARY STENT PLACEMENT  12/23/2015   Successful PTCA/DES x 1 proximal LAD  . HIP SURGERY Left 1987   for polio, not sure what was done  . KNEE SURGERY Left 1987   for polio       Home Medications    Prior to Admission medications   Medication Sig Start Date End Date Taking? Authorizing Provider  aspirin EC 81 MG tablet Take 81 mg by mouth daily.   Yes [provider]  clopidogrel (PLAVIX) 75 MG tablet Take 1 tablet (75 mg total) by mouth daily with breakfast. 12/24/15  Yes Kilroy, Luke K, PA-C  lisinopril (PRINIVIL,ZESTRIL) 20 MG tablet Take 1 tablet (20 mg total) by mouth daily. 04/20/16  Yes Chrystie Nose, MD  lovastatin (MEVACOR) 20 MG tablet TAKE 1 TABLET (20 MG TOTAL) BY MOUTH AT BEDTIME. 06/26/16  Yes Hilty, Lisette Abu, MD  metoprolol tartrate (LOPRESSOR) 25 MG tablet TAKE 1 TABLET BY MOUTH TWICE A DAY 06/26/16  Yes Rosalio Macadamia, NP  nitroGLYCERIN (NITROSTAT) 0.4 MG SL tablet Place 1 tablet (0.4 mg total) under the tongue every 5 (five) minutes as needed for chest pain. 12/18/15  Yes Rosalio Macadamia, NP    Family History Family History  Problem Relation Age of Onset  . Hypertension Mother   . Heart failure Father     Social History Social History  Substance Use Topics  . Smoking status: Current Every Day Smoker    Packs/day: 2.00    Types: Cigarettes  . Smokeless tobacco: Never Used  . Alcohol use Yes     Comment: occ     Allergies   Pork-derived products   Review of  Systems Review of Systems A complete 10 system review of systems was obtained and all systems are negative except as noted in the HPI and PMH.    Physical Exam Updated Vital Signs BP (!) 139/92 (BP Location: Left Arm)   Pulse 95   Temp 98.1 F (36.7 C) (Oral)   Resp 18   Ht 5\' 5"  (1.651 m)   Wt 160 lb (72.6 kg)   SpO2 99%   BMI 26.63 kg/m   Physical Exam  Constitutional: He is oriented to person, place, and time. He appears well-developed and well-nourished. No distress.  Nontoxic appearing and in NAD.  HENT:  Head: Normocephalic and atraumatic.  Eyes: Conjunctivae and  EOM are normal. No scleral icterus.  Neck: Normal range of motion.  Cardiovascular: Normal rate, regular rhythm and intact distal pulses.   Pulmonary/Chest: Effort normal. No respiratory distress. He has no wheezes. He has no rales.  Respirations even and unlabored. Lungs CTAB.  Musculoskeletal: Normal range of motion.  Neurological: He is alert and oriented to person, place, and time.  Skin: Skin is warm and dry. No rash noted. He is not diaphoretic. No erythema. No pallor.  Psychiatric: He has a normal mood and affect. His behavior is normal.  Nursing note and vitals reviewed.    ED Treatments / Results  DIAGNOSTIC STUDIES: Oxygen Saturation is 99% on RA, normal by my interpretation.   COORDINATION OF CARE: 1:50 AM-Discussed next steps with pt which includes finding a PCP. Pt verbalized understanding and is agreeable with the plan.    Labs (all labs ordered are listed, but only abnormal results are displayed) Labs Reviewed  BASIC METABOLIC PANEL - Abnormal; Notable for the following:       Result Value   Glucose, Bld 119 (*)    All other components within normal limits  CBC - Abnormal; Notable for the following:    WBC 13.0 (*)    All other components within normal limits  CK  I-STAT TROPOININ, ED    EKG  EKG Interpretation  Date/Time:  Thursday Oct 15 2016 00:08:38 EDT Ventricular Rate:  92 PR Interval:    QRS Duration: 86 QT Interval:  356 QTC Calculation: 441 R Axis:   66 Text Interpretation:  Sinus rhythm Probable anteroseptal infarct, old non specific st t changes, unchanged from previous  Confirmed by Nicanor Alcon, April ([id number]) on 10/15/2016 12:38:21 AM       Radiology Dg Chest 2 View  Result Date: 10/15/2016 CLINICAL DATA:  Cough and congestion x1 week EXAM: CHEST  2 VIEW COMPARISON:  None. FINDINGS: The heart size and mediastinal contours are within normal limits. Mild interstitial prominence consistent chronic bronchitic change. No pneumonic consolidation,  effusion or CHF. The visualized skeletal structures are unremarkable. IMPRESSION: Mild interstitial prominence bilaterally which may reflect chronic bronchitic change. Electronically Signed   By: Tollie Eth M.D.   On: 10/15/2016 00:44    Procedures Procedures (including critical care time)  Medications Ordered in ED Medications - No data to display   Initial Impression / Assessment and Plan / ED Course  I have reviewed the triage vital signs and the nursing notes.  Pertinent labs & imaging results that were available during my care of the patient were reviewed by me and considered in my medical decision making (see chart for details).     46 year old male presents to the emergency department for complaints of chest pain. He has had similar chest pain in the past. He is actively followed by  cardiology. No fevers. Vital stable. Cardiac workup is reassuring. Chest x-ray without evidence of acute cardiopulmonary abnormality. Patient has had no acute decompensation during his stay in the emergency department. Low suspicion for acute coronary event, such as acute MI, as cause for pain today. Given that patient has follow-up with his cardiologist tomorrow, I believe outpatient management is appropriate at this time. He has been advised to return for new or concerning symptoms. Patient discharged in stable condition; agreeable to plan with no unaddressed concerns.   Final Clinical Impressions(s) / ED Diagnoses   Final diagnoses:  Atypical chest pain  Arthralgia, unspecified joint    New Prescriptions New Prescriptions   No medications on file   I personally performed the services described in this documentation, which was scribed in my presence. The recorded information has been reviewed and is accurate.       Antony MaduraHumes, Tatiyana Foucher, PA-C 11/07/16 0041    Palumbo, April, MD 11/25/16 1659

## 2016-10-15 NOTE — ED Notes (Signed)
Bed: [facility] Expected date:  Expected time:  Means of arrival:  Comments: 

## 2016-10-15 NOTE — ED Notes (Signed)
Bed: [facility] Expected date:  Expected time:  Means of arrival:  Comments: 

## 2016-10-15 NOTE — ED Triage Notes (Signed)
Pt states he is having chest pain and pain all over  Pt states he feels very fatiqued and weak   Pt states he finds it is hard to focus on tasks  Pt states he has muscle pain all over

## 2016-10-15 NOTE — Patient Instructions (Addendum)
Medication Instructions:  Your physician recommends that you continue on your current medications as directed. Please refer to the Current Medication list given to you today.  Labwork: NONE  Testing/Procedures: NONE  Follow-Up: Your physician recommends that you schedule a follow-up appointment in: 3 MONTH OV  You have been referred to NEUROLOGIST YOU MAY HAVE TO SEE YOUR PRIMARY CARE DOCTOR FIRST WE WILL LET YOU KNOW   If you need a refill on your cardiac medications before your next appointment, please call your pharmacy.

## 2016-10-15 NOTE — ED Notes (Addendum)
Pt states that he has been having cheat pain x 1 month intermittent to left side of his chest that radiates to left side of neck and left arm. Pt denies having SOB with CP episode. Pt does report that he has been more fatigue and generalized weakness. Pt denies chest pain at this time. BUT states that his visit today is mainly because his discomfort all over his body is what is concerning

## 2016-10-15 NOTE — ED Notes (Signed)
Bed: [facility] Expected date:  Expected time:  Means of arrival:  Comments: 

## 2016-10-15 NOTE — Discharge Instructions (Signed)
We recommend that you follow-up with your cardiologist today your scheduled appointment for further evaluation of your persistent chest pain. For your joint pain, we advise the use of prednisone. Continue taking your daily medications. Follow-up with your primary care doctor to ensure resolution of symptoms.

## 2016-10-15 NOTE — Progress Notes (Addendum)
OFFICE NOTE  Chief Complaint:  Follow-up ER visit  Primary Care Physician: Daylene Posey, FNP  HPI:  John Mcintyre is a pleasant 46 year old male [place] He says for almost all of his life he had polio and this affected his legs significantly causing weakness in the need to use crutches to walk. 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 Recently he's been having worsening pain in his upper chest shoulders and down both arms. Some the pain is in the back of his neck and even radiates to the right half of his face. While some of those symptoms certainly sound neurologic, he also has some associated fatigue and shortness of breath. He is a smoker and has a history of hypertension and dyslipidemia which is not been well controlled. He was recently started on lovastatin. Triglycerides were noted to be elevated greater than 700 and recently came down to the 300s. His father has a history of heart disease and sounds like he died from heart failure in his 26s. I had the pleasure seeing John Mcintyre back in the office today. He underwent a nuclear stress test which was interpreted as low risk. There was a very small distal anteroapical fixed defect which was suggestive of possible scar. EF was low normal at 52%. No reversible ischemia was seen. He reports no further significant symptoms. Subsequently he presented with worsening chest pain and was referred by Truitt Merle, NP for cardiac catheterization in 11/2015. 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 There was a CTO of the RCA with left to right collaterals and severe proximal LAD stenosis. He had successful PCI to the proximal LAD and was chest pain free thereafter.   04/20/2016  He was seen in the ER yesterday with recurrent chest pain for the past 2 days which is similar to his prior anginal symptoms. He reports compliance with his medications, but did not take it this morning. BP was  174/113 on admission. He was seen in the office in 12/2015 and his lisinopril was increased to 10 mg daily. Initial troponin was negative. Labs indicate a mildly elevated WBC count at 11.3. CXR unremarkable. EKG shows NSR without ischemic changes. He ruled out for acute MI and I increased his lisinopril to 20 mg daily. He said he try to get to the pharmacy for that prescription, but was somewhat confused that I wanted him to take twice the dose that he currently takes. This morning he took 10 mg of lisinopril blood pressure was 152/90. He denies any further chest pain. He took Aleve over-the-counter and reports improvement in his neck pain and shoulder pain. He continues to have difficulty ambulating, particularly with worsening weakness of his legs secondary to polio.  10/15/2016  John Mcintyre returns for follow-up. He was seen in the ER last night and discharged early this morning for chest pain. He recently called our office about this. He describes pain all across his chest and upper back and into his arms. This pain is completely different than the pain he had prior to his cardiac stent placement. 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 Troponin was negative. He was discharged for follow-up with [name]. He feels that his pain may be related to using crutches. 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 He may have neuropathic or musculoskeletal pain related to that. He is continuing on aspirin and Plavix which is more than adequate treatment for his coronary disease. He also complained of visual changes today including difficulty reading. I suspect this is presbyopia however at times he feels that he has some numbness on one half of his face and other unusual symptoms. He does report very poor sleep at night as well. He denies any snoring or apnea but gets less than 6  hours of sleep. He also tried Chantix for smoking cessation but said that it worsened his pain and he stopped taking it. He also felt that it made it more difficult for him to sleep.  PMHx:  Past Medical History:  Diagnosis Date  . Coronary artery disease   . Hypercholesteremia   . Hypertension   . Polio     Past Surgical History:  Procedure Laterality Date  . CARDIAC CATHETERIZATION N/A 12/23/2015   Procedure: Left Heart Cath and Coronary Angiography;  Surgeon: Burnell Blanks, MD;  Location: [facility];  Service: Cardiovascular;  Laterality: N/A;  . CARDIAC CATHETERIZATION N/A 12/23/2015   Procedure: Coronary Stent Intervention;  Surgeon: Burnell Blanks, MD;  Location: [place] [facility];  Service: Cardiovascular;  Laterality: N/A;  . CORONARY STENT PLACEMENT  12/23/2015   Successful PTCA/DES x 1 proximal LAD  . HIP SURGERY Left 1987   for polio, not sure what was done  . KNEE SURGERY Left 1987   for polio    FAMHx:  Family History  Problem Relation Age of Onset  . Hypertension Mother   . Heart failure Father     SOCHx:   reports that he has been smoking Cigarettes.  He has been smoking about 2.00 packs per day. He has never used smokeless tobacco. He reports that he drinks alcohol. He reports that he does not use drugs.  ALLERGIES:  Allergies  Allergen Reactions  . Pork-Derived Products Other (See Comments)    Patient does not eat pork because of his culture    ROS: Pertinent items noted in HPI and remainder of comprehensive ROS otherwise negative.  HOME MEDS: Current Outpatient Prescriptions  Medication Sig Dispense Refill  . aspirin EC 81 MG tablet Take 81 mg by mouth daily.    . clopidogrel (PLAVIX) 75 MG tablet Take 1 tablet (75 mg total) by mouth daily with breakfast. 90 tablet 3  . lisinopril (PRINIVIL,ZESTRIL) 20 MG tablet Take 1 tablet (20 mg total) by mouth daily. 30 tablet 6  . lovastatin (MEVACOR) 20 MG tablet TAKE 1 TABLET (20 MG  TOTAL) BY MOUTH AT BEDTIME. 30 tablet 8  . metoprolol tartrate (LOPRESSOR) 25 MG tablet TAKE 1 TABLET BY MOUTH TWICE A DAY 60 tablet 9  . nitroGLYCERIN (NITROSTAT) 0.4 MG SL tablet Place 1 tablet (0.4 mg total) under the tongue every 5 (five) minutes as needed for chest pain. 90 tablet 3   No current facility-administered medications for this visit.     LABS/IMAGING: Results for orders placed or performed during the hospital encounter of 10/15/16 (from the past 48 hour(s))  Basic metabolic panel     Status: Abnormal   Collection Time: 10/15/16  1:08 AM  Result Value Ref Range   Sodium 137 135 - 145 mmol/L   Potassium 3.9 3.5 - 5.1 mmol/L   Chloride 104 101 - 111 mmol/L   CO2 23 22 - 32 mmol/L   Glucose, Bld 119 (H) 65 - 99  mg/dL   BUN 10 6 - 20 mg/dL   Creatinine, Ser 0.73 0.61 - 1.24 mg/dL   Calcium 9.5 8.9 - 10.3 mg/dL   GFR calc non Af Amer >60 >60 mL/min   GFR calc Af Amer >60 >60 mL/min    Comment: (NOTE) The eGFR has been calculated using the CKD EPI equation. This calculation has not been validated in all clinical situations. eGFR's persistently <60 mL/min signify possible Chronic Kidney Disease.    Anion gap 10 5 - 15  CBC     Status: Abnormal   Collection Time: 10/15/16  1:08 AM  Result Value Ref Range   WBC 13.0 (H) 4.0 - 10.5 K/uL   RBC 5.16 4.22 - 5.81 MIL/uL   Hemoglobin 16.0 13.0 - 17.0 g/dL   HCT 45.6 39.0 - 52.0 %   MCV 88.4 78.0 - 100.0 fL   MCH 31.0 26.0 - 34.0 pg   MCHC 35.1 30.0 - 36.0 g/dL   RDW 13.4 11.5 - 15.5 %   Platelets 228 150 - 400 K/uL  CK     Status: None   Collection Time: 10/15/16  1:12 AM  Result Value Ref Range   Total CK 169 49 - 397 U/L  I-stat troponin, ED     Status: None   Collection Time: 10/15/16  1:19 AM  Result Value Ref Range   Troponin i, poc 0.00 0.00 - 0.08 ng/mL   Comment 3            Comment: Due to the release kinetics of cTnI, a negative result within the first hours of the onset of symptoms does not rule  out myocardial infarction with certainty. If myocardial infarction is still suspected, repeat the test at appropriate intervals.    Dg Chest 2 View  Result Date: 10/15/2016 CLINICAL DATA:  Cough and congestion x1 week EXAM: CHEST  2 VIEW COMPARISON:  None. FINDINGS: The heart size and mediastinal contours are within normal limits. Mild interstitial prominence consistent chronic bronchitic change. No pneumonic consolidation, effusion or CHF. The visualized skeletal structures are unremarkable. IMPRESSION: Mild interstitial prominence bilaterally which may reflect chronic bronchitic change. Electronically Signed   By: Ashley Royalty M.D.   On: 10/15/2016 00:44    WEIGHTS: Wt Readings from Last 3 Encounters:  10/15/16 158 lb 6.4 oz (71.8 kg)  10/15/16 160 lb (72.6 kg)  07/29/16 162 lb (73.5 kg)    VITALS: BP 108/70   Pulse 88   Ht 5' 5" (1.651 m)   Wt 158 lb 6.4 oz (71.8 kg)   BMI 26.36 kg/m   EXAM: General appearance: alert and no distress Neck: no carotid bruit and no JVD Lungs: clear to auscultation bilaterally Heart: regular rate and rhythm Abdomen: soft, non-tender; bowel sounds normal; no masses,  no organomegaly Extremities: Peripheral muscle wasting secondary to polio Pulses: 2+ and symmetric Skin: Skin color, texture, turgor normal. No rashes or lesions Neurologic: Grossly normal Psych: Pleasant  EKG: (See history of present illness)  ASSESSMENT: 1. Chest pain- atypical, sounds neuropathic in nature- recent PCI to the LAD (11/2015) and noted CTO of the RCA with left to right collaterals 2. Hypertension 3. Dyslipidemia 4. Tobacco abuse 5. 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   John Mcintyre has had some recurrent chest pain however no objective findings of recurrent coronary ischemia. He is close to a year out from his stent and remains on aspirin and Plavix. His medical therapy  is appropriate. Workup in the ER  last evening and early this morning was negative for ischemia. Troponins are negative. I suspect his pain is related to his prior polio illness and may be neuropathic in nature. He's also had some visual changes and hemicranial neuralgia. I would recommend neurologic evaluation and will refer him to neurology. There may also be an underlying sleep disorder and consideration for a sleep study is warranted. I'll defer to neurology on this.  Follow-up with me in 3 months and we'll determine whether he can come off of Plavix at that time.  Pixie Casino, MD, [facility] Attending Cardiologist [place] 10/15/2016, 2:52 PM

## 2016-10-16 NOTE — ED Provider Notes (Signed)
Medical screening examination/treatment/procedure(s) were performed by non-physician practitioner and as supervising physician I was immediately available for consultation/collaboration.   EKG Interpretation  Date/Time:  Thursday Oct 15 2016 00:08:38 EDT Ventricular Rate:  92 PR Interval:    QRS Duration: 86 QT Interval:  356 QTC Calculation: 441 R Axis:   66 Text Interpretation:  Sinus rhythm Probable anteroseptal infarct, old non specific st t changes, unchanged from previous  Confirmed by Nicanor AlconPalumbo, Kitiara Hintze ([id number]) on 10/15/2016 12:38:21 AM        John Pasquariello, MD 10/16/16 [id number]

## 2016-10-20 NOTE — ED Provider Notes (Signed)
Medical screening examination/treatment/procedure(s) were performed by non-physician practitioner and as supervising physician I was immediately available for consultation/collaboration.   EKG Interpretation  Date/Time:  Thursday Oct 15 2016 00:08:38 EDT Ventricular Rate:  92 PR Interval:    QRS Duration: 86 QT Interval:  356 QTC Calculation: 441 R Axis:   66 Text Interpretation:  Sinus rhythm Probable anteroseptal infarct, old non specific st t changes, unchanged from previous  Confirmed by Nicanor AlconPalumbo, Cayton Cuevas ([id number]) on 10/15/2016 12:38:21 AM        Aldahir Litaker, MD 10/20/16 0028

## 2016-12-26 ENCOUNTER — Other Ambulatory Visit: Payer: Self-pay | Admitting: Internal Medicine

## 2016-12-26 ENCOUNTER — Other Ambulatory Visit: Payer: Self-pay | Admitting: Cardiology

## 2016-12-28 NOTE — Telephone Encounter (Signed)
Rx has been sent to the pharmacy electronically. ° °

## 2017-01-22 ENCOUNTER — Encounter: Payer: Self-pay | Admitting: Internal Medicine

## 2017-01-22 ENCOUNTER — Ambulatory Visit (INDEPENDENT_AMBULATORY_CARE_PROVIDER_SITE_OTHER): Payer: Medicaid Other | Admitting: Internal Medicine

## 2017-01-22 VITALS — BP 120/68 | HR 94 | Ht 65.0 in | Wt 162.0 lb

## 2017-01-22 DIAGNOSIS — A809 Acute poliomyelitis, unspecified: Secondary | ICD-10-CM

## 2017-01-22 DIAGNOSIS — Z720 Tobacco use: Secondary | ICD-10-CM

## 2017-01-22 DIAGNOSIS — I2510 Atherosclerotic heart disease of native coronary artery without angina pectoris: Secondary | ICD-10-CM

## 2017-01-22 DIAGNOSIS — E7800 Pure hypercholesterolemia, unspecified: Secondary | ICD-10-CM | POA: Diagnosis not present

## 2017-01-22 DIAGNOSIS — Z9861 Coronary angioplasty status: Secondary | ICD-10-CM | POA: Diagnosis not present

## 2017-01-22 DIAGNOSIS — I10 Essential (primary) hypertension: Secondary | ICD-10-CM | POA: Diagnosis not present

## 2017-01-22 NOTE — Progress Notes (Signed)
OFFICE NOTE  Chief Complaint:  Leg weakness  Primary Care Physician: Deneen Harts, FNP  HPI:  John Mcintyre is a pleasant 46 year old male [place]. He says for almost all of his life he had polio and this affected his legs significantly causing weakness in the need to use crutches to walk. 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 Recently he's been having worsening pain in his upper chest shoulders and down both arms. Some the pain is in the back of his neck and even radiates to the right half of his face. While some of those symptoms certainly sound neurologic, he also has some associated fatigue and shortness of breath. He is a smoker and has a history of hypertension and dyslipidemia which is not been well controlled. He was recently started on lovastatin. Triglycerides were noted to be elevated greater than 700 and recently came down to the 300s. His father has a history of heart disease and sounds like he died from heart failure in his 47s. I had the pleasure seeing John Mcintyre back in the office today. He underwent a nuclear stress test which was interpreted as low risk. There was a very small distal anteroapical fixed defect which was suggestive of possible scar. EF was low normal at 52%. No reversible ischemia was seen. He reports no further significant symptoms. Subsequently he presented with worsening chest pain and was referred by Norma Fredrickson, NP for cardiac catheterization in 11/2015. 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 There was a CTO of the RCA with left to right collaterals and severe proximal LAD stenosis. He had successful PCI to the proximal LAD and was chest pain free thereafter.   04/20/2016  He was seen in the ER yesterday with recurrent chest pain for the past 2 days which is similar to his prior anginal symptoms. He reports compliance with his medications, but did not take it this morning. BP was  174/113 on admission. He was seen in the office in 12/2015 and his lisinopril was increased to 10 mg daily. Initial troponin was negative. Labs indicate a mildly elevated WBC count at 11.3. CXR unremarkable. EKG shows NSR without ischemic changes. He ruled out for acute MI and I increased his lisinopril to 20 mg daily. He said he try to get to the pharmacy for that prescription, but was somewhat confused that I wanted him to take twice the dose that he currently takes. This morning he took 10 mg of lisinopril blood pressure was 152/90. He denies any further chest pain. He took Aleve over-the-counter and reports improvement in his neck pain and shoulder pain. He continues to have difficulty ambulating, particularly with worsening weakness of his legs secondary to polio.  10/15/2016  John Mcintyre returns for follow-up. He was seen in the ER last night and discharged early this morning for chest pain. He recently called our office about this. He describes pain all across his chest and upper back and into his arms. This pain is completely different than the pain he had prior to his cardiac stent placement. 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 Troponin was negative. He was discharged for follow-up with [name]. He feels that his pain may be related to using crutches. 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 He may have neuropathic or musculoskeletal pain related to that. He is continuing on aspirin and Plavix which is more than adequate treatment for his coronary disease. He also complained of visual changes today including difficulty reading. I suspect this is presbyopia however at times he feels that he has some numbness on one half of his face and other unusual symptoms. He does report very poor sleep at night as well. He denies any snoring or apnea but gets less than 6  hours of sleep. He also tried Chantix for smoking cessation but said that it worsened his pain and he stopped taking it. He also felt that it made it more difficult for him to sleep.  01/22/2017  John Mcintyre returns for follow-up. He reports she's had no further chest pain. He continues to have problems with leg weakness secondary to history of polio. He has had significant muscle wasting particularly in the left hip and more difficulty walking. He is inquiring about bracing. I referred him to neurology however the wait was so long he did not take the appointment. He is more than 1 year out from stenting and I believe could be taken off of Plavix. Blood pressure is at goal today.  PMHx:  Past Medical History:  Diagnosis Date  . Coronary artery disease   . Hypercholesteremia   . Hypertension   . Polio     Past Surgical History:  Procedure Laterality Date  . CARDIAC CATHETERIZATION N/A 12/23/2015   Procedure: Left Heart Cath and Coronary Angiography;  Surgeon: Kathleene Hazel, MD;  Location: [facility] INVASIVE CV LAB;  Service: Cardiovascular;  Laterality: N/A;  . CARDIAC CATHETERIZATION N/A 12/23/2015   Procedure: Coronary Stent Intervention;  Surgeon: Kathleene Hazel, MD;  Location: [facility] INVASIVE CV LAB;  Service: Cardiovascular;  Laterality: N/A;  . CORONARY STENT PLACEMENT  12/23/2015   Successful PTCA/DES x 1 proximal LAD  . HIP SURGERY Left 1987   for polio, not sure what was done  . KNEE SURGERY Left 1987   for polio    FAMHx:  Family History  Problem Relation Age of Onset  . Hypertension Mother   . Heart failure Father     SOCHx:   reports that he has been smoking Cigarettes.  He has been smoking about 2.00 packs per day. He has never used smokeless tobacco. He reports that he drinks alcohol. He reports that he does not use drugs.  ALLERGIES:  Allergies  Allergen Reactions  . Pork-Derived Products Other (See Comments)    Patient does not eat pork because of his  culture    ROS: Pertinent items noted in HPI and remainder of comprehensive ROS otherwise negative.  HOME MEDS: Current Outpatient Prescriptions  Medication Sig Dispense Refill  . aspirin EC 81 MG tablet Take 81 mg by mouth daily.    [name] lisinopril (PRINIVIL,ZESTRIL) 20 MG tablet TAKE 1 TABLET BY MOUTH EVERY DAY 30 tablet 9  . lovastatin (MEVACOR) 20 MG tablet TAKE 1 TABLET (20 MG TOTAL) BY MOUTH AT BEDTIME. 30 tablet 8  . metoprolol tartrate (LOPRESSOR) 25 MG tablet TAKE 1 TABLET BY MOUTH TWICE A DAY 60 tablet 9  . nitroGLYCERIN (NITROSTAT) 0.4 MG SL tablet Place 1 tablet (0.4 mg total) under the tongue every 5 (five) minutes as needed for chest pain. 90 tablet 3   No current facility-administered medications for this visit.     LABS/IMAGING: No results found for this or any previous visit (from the past 48 hour(s)). No  results found.  WEIGHTS: Wt Readings from Last 3 Encounters:  01/22/17 162 lb (73.5 kg)  10/15/16 158 lb 6.4 oz (71.8 kg)  10/15/16 160 lb (72.6 kg)    VITALS: BP 120/68   Pulse 94   Ht 5\' 5"  (1.651 m)   Wt 162 lb (73.5 kg) Comment: 162LB  BMI 26.96 kg/m   EXAM: General appearance: alert and no distress Neck: no carotid bruit and no JVD Lungs: clear to auscultation bilaterally Heart: regular rate and rhythm Abdomen: soft, non-tender; bowel sounds normal; no masses,  no organomegaly Extremities: Peripheral muscle wasting secondary to polio Pulses: 2+ and symmetric Skin: Skin color, texture, turgor normal. No rashes or lesions Neurologic: Grossly normal Psych: Pleasant  EKG: Sinus rhythm at 94, anterolateral infarct pattern-personally reviewed  ASSESSMENT: 1. Chest pain- atypical, sounds neuropathic in nature- recent PCI to the LAD (11/2015) and noted CTO of the RCA with left to right collaterals 2. Hypertension 3. Dyslipidemia 4. Tobacco abuse 5. 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   Mr.  Mcintyre has had atypical sounding chest pain in the past which is better. He had PCI to the LAD in 2017 and has a chronic total occlusion of the right with adequate left-to-right collaterals. At this point I feel he could come off of Plavix and remain on aspirin monotherapy. He has hypertension which is well controlled. He has postpolio symptoms with significant lower extremity muscle wasting and difficulty walking around without assistance of crutches. He may benefit from a physical medicine and rehabilitation assessment and perhaps bracing. I'll refer him to Dr. Riley Kill for evaluation.  Follow-up with me in 6 months.  Chrystie Nose, MD, [facility] Attending Cardiologist [facility]  Chrystie Nose 01/22/2017, 6:13 PM

## 2017-01-22 NOTE — Patient Instructions (Addendum)
Your physician has recommended you make the following change in your medication:  -- STOP clopidogrel (Plavix)  You have been referred to Dr. Ivory Broad   Your physician wants you to follow-up in: 6 months with Dr. Rennis Golden. You will receive a reminder letter in the mail two months in advance. If you don't receive a letter, please call our office to schedule the follow-up appointment.

## 2017-01-28 ENCOUNTER — Encounter (HOSPITAL_COMMUNITY): Payer: Self-pay | Admitting: Emergency Medicine

## 2017-01-28 ENCOUNTER — Emergency Department (HOSPITAL_COMMUNITY)
Admission: EM | Admit: 2017-01-28 | Discharge: 2017-01-28 | Disposition: A | Discharge status: Home / Self Care | Payer: Medicaid Other | Attending: Physician Assistant | Admitting: Physician Assistant

## 2017-01-28 DIAGNOSIS — R109 Unspecified abdominal pain: Secondary | ICD-10-CM

## 2017-01-28 DIAGNOSIS — Z79899 Other long term (current) drug therapy: Secondary | ICD-10-CM | POA: Insufficient documentation

## 2017-01-28 DIAGNOSIS — I119 Hypertensive heart disease without heart failure: Secondary | ICD-10-CM | POA: Diagnosis not present

## 2017-01-28 DIAGNOSIS — I2510 Atherosclerotic heart disease of native coronary artery without angina pectoris: Secondary | ICD-10-CM | POA: Diagnosis not present

## 2017-01-28 DIAGNOSIS — Z7982 Long term (current) use of aspirin: Secondary | ICD-10-CM | POA: Diagnosis not present

## 2017-01-28 DIAGNOSIS — F17210 Nicotine dependence, cigarettes, uncomplicated: Secondary | ICD-10-CM | POA: Insufficient documentation

## 2017-01-28 DIAGNOSIS — R1031 Right lower quadrant pain: Secondary | ICD-10-CM | POA: Insufficient documentation

## 2017-01-28 LAB — URINALYSIS, ROUTINE W REFLEX MICROSCOPIC
BILIRUBIN URINE: NEGATIVE
Glucose, UA: NEGATIVE mg/dL
HGB URINE DIPSTICK: NEGATIVE
Ketones, ur: NEGATIVE mg/dL
Leukocytes, UA: NEGATIVE
NITRITE: NEGATIVE
PROTEIN: NEGATIVE mg/dL
Specific Gravity, Urine: 1.025 (ref 1.005–1.030)
pH: 5 (ref 5.0–8.0)

## 2017-01-28 MED ORDER — IBUPROFEN 200 MG PO TABS
ORAL_TABLET | ORAL | Status: AC
  Administered 2017-01-28: 400 mg
  Filled 2017-01-28: qty 2

## 2017-01-28 MED ORDER — IBUPROFEN 600 MG PO TABS: 600.0000 mg | ORAL_TABLET | Freq: Four times a day (QID) | ORAL | 0 refills | Status: DC | PRN

## 2017-01-28 MED ORDER — IBUPROFEN 400 MG PO TABS: 400.0000 mg | ORAL_TABLET | Freq: Once | ORAL | Status: DC | PRN

## 2017-01-28 NOTE — ED Provider Notes (Signed)
[facility]-EMERGENCY DEPT Provider Note   CSN: [id number] Arrival date & time: 01/28/17  1743     History   Chief Complaint Chief Complaint  Patient presents with  . Flank Pain    HPI John Mcintyre is a 46 y.o. male.   Flank Pain  This is a recurrent problem. The current episode started more than 1 week ago. The problem occurs every several days. The problem has not changed since onset.Associated symptoms include abdominal pain. He has tried nothing for the symptoms.    Past Medical History:  Diagnosis Date  . Coronary artery disease   . Hypercholesteremia   . Hypertension   . Polio     Patient Active Problem List   Diagnosis Date Noted  . Neuropathic pain 10/15/2016  . Hypertensive urgency 04/19/2016  . CAD S/P LAD DES 12/23/15 12/23/2015  . Tobacco abuse 12/23/2015  . Essential hypertension 10/11/2014  . Hypercholesteremia 10/11/2014  . Chest pain 10/11/2014  . Polio     Past Surgical History:  Procedure Laterality Date  . CARDIAC CATHETERIZATION N/A 12/23/2015   Procedure: Left Heart Cath and Coronary Angiography;  Surgeon: Kathleene Hazel, MD;  Location: [facility] INVASIVE CV LAB;  Service: Cardiovascular;  Laterality: N/A;  . CARDIAC CATHETERIZATION N/A 12/23/2015   Procedure: Coronary Stent Intervention;  Surgeon: Kathleene Hazel, MD;  Location: [facility] INVASIVE CV LAB;  Service: Cardiovascular;  Laterality: N/A;  . CORONARY STENT PLACEMENT  12/23/2015   Successful PTCA/DES x 1 proximal LAD  . HIP SURGERY Left 1987   for polio, not sure what was done  . KNEE SURGERY Left 1987   for polio       Home Medications    Prior to Admission medications   Medication Sig Start Date End Date Taking? Authorizing Provider  aspirin EC 81 MG tablet Take 81 mg by mouth daily.    [provider]  lisinopril (PRINIVIL,ZESTRIL) 20 MG tablet TAKE 1 TABLET BY MOUTH EVERY DAY 12/28/16   Hilty, Lisette Abu, MD  lovastatin (MEVACOR) 20 MG tablet TAKE 1 TABLET (20 MG  TOTAL) BY MOUTH AT BEDTIME. 06/26/16   Hilty, Lisette Abu, MD  metoprolol tartrate (LOPRESSOR) 25 MG tablet TAKE 1 TABLET BY MOUTH TWICE A DAY 06/26/16   Rosalio Macadamia, NP  nitroGLYCERIN (NITROSTAT) 0.4 MG SL tablet Place 1 tablet (0.4 mg total) under the tongue every 5 (five) minutes as needed for chest pain. 12/18/15   Rosalio Macadamia, NP    Family History Family History  Problem Relation Age of Onset  . Hypertension Mother   . Heart failure Father     Social History Social History  Substance Use Topics  . Smoking status: Current Every Day Smoker    Packs/day: 2.00    Types: Cigarettes  . Smokeless tobacco: Never Used  . Alcohol use Yes     Comment: occ     Allergies   Pork-derived products   Review of Systems Review of Systems  Gastrointestinal: Positive for abdominal pain.  Genitourinary: Positive for flank pain.  All other systems reviewed and are negative.    Physical Exam Updated Vital Signs BP 129/85 (BP Location: Right Arm)   Pulse 83   Temp 98.3 F (36.8 C) (Oral)   Resp 19   Ht 5\' 5"  (1.651 m)   Wt 73.5 kg (162 lb)   SpO2 99%   BMI 26.96 kg/m   Physical Exam  Constitutional: He is oriented to person, place, and time. He appears well-developed  and well-nourished.  HENT:  Head: Atraumatic.  Eyes: Conjunctivae are normal.  Neck: Neck supple.  Cardiovascular: Regular rhythm and normal heart sounds.   Pulmonary/Chest: Effort normal and breath sounds normal.  Abdominal: Soft. He exhibits no distension and no mass. There is no rebound and no guarding.  Musculoskeletal: He exhibits no edema or tenderness.  Neurological: He is alert and oriented to person, place, and time.  Skin: Skin is warm and dry. Capillary refill takes less than 2 seconds.  Psychiatric: He has a normal mood and affect.  Nursing note and vitals reviewed.    ED Treatments / Results  Labs (all labs ordered are listed, but only abnormal results are displayed) Labs Reviewed    URINALYSIS, ROUTINE W REFLEX MICROSCOPIC - Abnormal; Notable for the following:       Result Value   APPearance HAZY (*)    All other components within normal limits    EKG  EKG Interpretation None       Radiology No results found.  Procedures Procedures (including critical care time)  Medications Ordered in ED Medications  ibuprofen (ADVIL,MOTRIN) tablet 400 mg (not administered)  ibuprofen (ADVIL,MOTRIN) 200 MG tablet (400 mg  Given 01/28/17 1957)     Initial Impression / Assessment and Plan / ED Course  I have reviewed the triage vital signs and the nursing notes.  Pertinent labs & imaging results that were available during my care of the patient were reviewed by me and considered in my medical decision making (see chart for details).     Patient is nontoxic, nonseptic appearing, in no apparent distress. Patient with intermittent right sided flank pain that radiates to RLQ. Seems to be muscular in origin. Reassuring abdominal exam. Patient does not meet the SIRS or Sepsis criteria.  On repeat exam patient does not have a surgical abdomen and there are no peritoneal signs. Doubt appendicitis, bowel obstruction, bowel perforation, cholecystitis, diverticulitis, or renal colic. Patient discharged home with symptomatic treatment and given strict instructions for follow-up with their primary care physician.  I have also discussed reasons to return immediately to the ER.  Patient expresses understanding and agrees with plan.    Final Clinical Impressions(s) / ED Diagnoses   Final diagnoses:  Right flank pain    New Prescriptions Discharge Medication List as of 01/28/2017 10:07 PM    START taking these medications   Details  ibuprofen (ADVIL,MOTRIN) 600 MG tablet Take 1 tablet (600 mg total) by mouth every 6 (six) hours as needed., Starting Thu 01/28/2017, Print         Felicie MornSmith, Benjamyn Hestand, NP 01/29/17 0103    Abelino DerrickMackuen, Courteney Lyn, MD 02/06/17 [phone]

## 2017-01-28 NOTE — ED Triage Notes (Signed)
Pt states he is having 9/10 right flank pain, denies any burning sensation with urination, states his urine is red but he doesn't think is blood. No fever, chills, nausea or vomiting.

## 2017-03-17 ENCOUNTER — Other Ambulatory Visit: Payer: Self-pay | Admitting: *Deleted

## 2017-03-17 DIAGNOSIS — A809 Acute poliomyelitis, unspecified: Secondary | ICD-10-CM

## 2017-03-17 DIAGNOSIS — M792 Neuralgia and neuritis, unspecified: Secondary | ICD-10-CM

## 2017-04-23 ENCOUNTER — Other Ambulatory Visit: Payer: Self-pay | Admitting: Internal Medicine

## 2017-05-14 ENCOUNTER — Encounter (HOSPITAL_COMMUNITY): Payer: Self-pay

## 2017-05-14 ENCOUNTER — Other Ambulatory Visit: Payer: Self-pay

## 2017-05-14 ENCOUNTER — Emergency Department (HOSPITAL_COMMUNITY): Payer: Medicaid Other

## 2017-05-14 ENCOUNTER — Emergency Department (HOSPITAL_COMMUNITY)
Admission: EM | Admit: 2017-05-14 | Discharge: 2017-05-14 | Disposition: A | Discharge status: Home / Self Care | Payer: Medicaid Other | Attending: Emergency Medicine | Admitting: Emergency Medicine

## 2017-05-14 DIAGNOSIS — M542 Cervicalgia: Secondary | ICD-10-CM | POA: Insufficient documentation

## 2017-05-14 DIAGNOSIS — Z7982 Long term (current) use of aspirin: Secondary | ICD-10-CM | POA: Insufficient documentation

## 2017-05-14 DIAGNOSIS — Z79899 Other long term (current) drug therapy: Secondary | ICD-10-CM | POA: Diagnosis not present

## 2017-05-14 DIAGNOSIS — G44229 Chronic tension-type headache, not intractable: Secondary | ICD-10-CM | POA: Diagnosis not present

## 2017-05-14 DIAGNOSIS — I10 Essential (primary) hypertension: Secondary | ICD-10-CM | POA: Insufficient documentation

## 2017-05-14 DIAGNOSIS — Z955 Presence of coronary angioplasty implant and graft: Secondary | ICD-10-CM | POA: Diagnosis not present

## 2017-05-14 DIAGNOSIS — F17210 Nicotine dependence, cigarettes, uncomplicated: Secondary | ICD-10-CM | POA: Diagnosis not present

## 2017-05-14 DIAGNOSIS — I2510 Atherosclerotic heart disease of native coronary artery without angina pectoris: Secondary | ICD-10-CM | POA: Diagnosis not present

## 2017-05-14 LAB — I-STAT TROPONIN, ED
TROPONIN I, POC: 0 ng/mL (ref 0.00–0.08)
TROPONIN I, POC: 0 ng/mL (ref 0.00–0.08)

## 2017-05-14 LAB — CBC
HEMATOCRIT: 44.5 % (ref 39.0–52.0)
HEMOGLOBIN: 15.5 g/dL (ref 13.0–17.0)
MCH: 30.6 pg (ref 26.0–34.0)
MCHC: 34.8 g/dL (ref 30.0–36.0)
MCV: 87.9 fL (ref 78.0–100.0)
Platelets: 228 10*3/uL (ref 150–400)
RBC: 5.06 MIL/uL (ref 4.22–5.81)
RDW: 13.6 % (ref 11.5–15.5)
WBC: 12.2 10*3/uL — ABNORMAL HIGH (ref 4.0–10.5)

## 2017-05-14 LAB — BASIC METABOLIC PANEL
ANION GAP: 9 (ref 5–15)
BUN: 10 mg/dL (ref 6–20)
CALCIUM: 9.1 mg/dL (ref 8.9–10.3)
CO2: 23 mmol/L (ref 22–32)
Chloride: 104 mmol/L (ref 101–111)
Creatinine, Ser: 0.65 mg/dL (ref 0.61–1.24)
GFR calc Af Amer: >60 mL/min (ref >60)
Glucose, Bld: 105 mg/dL — ABNORMAL HIGH (ref 65–99)
POTASSIUM: 3.5 mmol/L (ref 3.5–5.1)
SODIUM: 136 mmol/L (ref 135–145)

## 2017-05-14 MED ORDER — METHOCARBAMOL 500 MG PO TABS
500.0000 mg | ORAL_TABLET | Freq: Once | ORAL | Status: AC
  Administered 2017-05-14: 500 mg via ORAL
  Filled 2017-05-14: qty 1

## 2017-05-14 MED ORDER — HYDROCODONE-ACETAMINOPHEN 5-325 MG PO TABS
2.0000 | ORAL_TABLET | Freq: Once | ORAL | Status: AC
  Administered 2017-05-14: 2 via ORAL
  Filled 2017-05-14: qty 2

## 2017-05-14 MED ORDER — METHOCARBAMOL 500 MG PO TABS: 500.0000 mg | ORAL_TABLET | Freq: Three times a day (TID) | ORAL | 0 refills | Status: DC | PRN

## 2017-05-14 MED ORDER — HYDROCODONE-ACETAMINOPHEN 5-325 MG PO TABS: 1.0000 | ORAL_TABLET | Freq: Four times a day (QID) | ORAL | 0 refills | Status: DC | PRN

## 2017-05-14 NOTE — ED Notes (Signed)
Spoke with EDP and EDPA about patient. No new orders at this time.

## 2017-05-14 NOTE — ED Provider Notes (Signed)
[facility]-EMERGENCY DEPT Provider Note   CSN: [id number] Arrival date & time: 05/14/17  0508     History   Chief Complaint Chief Complaint  Patient presents with  . Headache    HPI Bentley Sellin is a 46 y.o. male.  HPI   46 year old male with past medical history as below here with neck pain and headache.  The patient works as a Media planner, then spends several hours at home looking and working over [employer]im[employer].  He is also on crutches for chronic polio.  He states he has been moving around more than usual as he recently is working on a shed in his backyard.  He reports that over the last several days, he has had aching, throbbing, cramp like paraspinal neck pain radiating up to his occiput then towards his head in a bandlike distribution.  The symptoms seem to be worse with any kind of movement.  Occasionally, the pain from his neck radiates down onto his upper chest.  He states that this is also worse with movement.  He is adamant that this chest pain is very different from his heart pain.  He does have history of coronary disease but denies any similarity to his previous chest pain.  Denies any shortness of breath, nausea, diaphoresis, or other complaints.  Is been taking his medications as prescribed.  No numbness or weakness in the upper extremities.  No other medical complaints.  Past Medical History:  Diagnosis Date  . Coronary artery disease   . Hypercholesteremia   . Hypertension   . Polio     Patient Active Problem List   Diagnosis Date Noted  . Neuropathic pain 10/15/2016  . Hypertensive urgency 04/19/2016  . CAD S/P LAD DES 12/23/15 12/23/2015  . Tobacco abuse 12/23/2015  . Essential hypertension 10/11/2014  . Hypercholesteremia 10/11/2014  . Chest pain 10/11/2014  . Polio     Past Surgical History:  Procedure Laterality Date  . CARDIAC CATHETERIZATION N/A 12/23/2015   Procedure: Left Heart Cath and Coronary Angiography;  Surgeon:  Kathleene Hazel, MD;  Location: [facility] INVASIVE CV LAB;  Service: Cardiovascular;  Laterality: N/A;  . CARDIAC CATHETERIZATION N/A 12/23/2015   Procedure: Coronary Stent Intervention;  Surgeon: Kathleene Hazel, MD;  Location: [facility] INVASIVE CV LAB;  Service: Cardiovascular;  Laterality: N/A;  . CORONARY STENT PLACEMENT  12/23/2015   Successful PTCA/DES x 1 proximal LAD  . HIP SURGERY Left 1987   for polio, not sure what was done  . KNEE SURGERY Left 1987   for polio       Home Medications    Prior to Admission medications   Medication Sig Start Date End Date Taking? Authorizing Provider  aspirin EC 81 MG tablet Take 81 mg by mouth daily.   Yes [provider]  lisinopril (PRINIVIL,ZESTRIL) 20 MG tablet TAKE 1 TABLET BY MOUTH EVERY DAY 12/28/16  Yes Hilty, Lisette Abu, MD  lovastatin (MEVACOR) 20 MG tablet TAKE 1 TABLET (20 MG TOTAL) BY MOUTH AT BEDTIME. 04/26/17  Yes Hilty, Lisette Abu, MD  metoprolol tartrate (LOPRESSOR) 25 MG tablet TAKE 1 TABLET BY MOUTH TWICE A DAY 06/26/16  Yes Rosalio Macadamia, NP  nitroGLYCERIN (NITROSTAT) 0.4 MG SL tablet Place 1 tablet (0.4 mg total) under the tongue every 5 (five) minutes as needed for chest pain. 12/18/15  Yes Rosalio Macadamia, NP  HYDROcodone-acetaminophen (NORCO/VICODIN) 5-325 MG tablet Take 1-2 tablets by mouth every 6 (six) hours as needed for moderate pain  or severe pain. DO NOT DRIVE WHILE TAKING THIS MEDICATION 05/14/17   Shaune PollackIsaacs, Waylynn Benefiel, MD  methocarbamol (ROBAXIN) 500 MG tablet Take 1 tablet (500 mg total) by mouth every 8 (eight) hours as needed for muscle spasms. 05/14/17   Shaune PollackIsaacs, Salina Stanfield, MD    Family History Family History  Problem Relation Age of Onset  . Hypertension Mother   . Heart failure Father     Social History Social History   Tobacco Use  . Smoking status: Current Every Day Smoker    Packs/day: 2.00    Types: Cigarettes  . Smokeless tobacco: Never Used  Substance Use Topics  . Alcohol use: Yes     Comment: occ  . Drug use: No     Allergies   Pork-derived products   Review of Systems Review of Systems  Constitutional: Positive for fatigue.  Musculoskeletal: Positive for neck pain.  Neurological: Positive for headaches.  All other systems reviewed and are negative.    Physical Exam Updated Vital Signs BP 137/82 (BP Location: Left Arm)   Pulse 88   Temp 97.9 F (36.6 C) (Oral)   Resp 18   SpO2 97%   Physical Exam  Constitutional: He is oriented to person, place, and time. He appears well-developed and well-nourished. No distress.  HENT:  Head: Normocephalic and atraumatic.  Eyes: Conjunctivae are normal.  Neck: Neck supple.  Significant tenderness over bilateral sternocleidomastoids and upper cervical paraspinal musculature.  This reproduces his chest and neck pain.  No midline tenderness or deformity.  No step-offs.  Cardiovascular: Normal rate, regular rhythm and normal heart sounds. Exam reveals no friction rub.  No murmur heard. Pulmonary/Chest: Effort normal and breath sounds normal. No respiratory distress. He has no wheezes. He has no rales.  Abdominal: He exhibits no distension.  Musculoskeletal: He exhibits no edema.  Neurological: He is alert and oriented to person, place, and time. He exhibits normal muscle tone.  Skin: Skin is warm. Capillary refill takes less than 2 seconds.  Psychiatric: He has a normal mood and affect.  Nursing note and vitals reviewed.    ED Treatments / Results  Labs (all labs ordered are listed, but only abnormal results are displayed) Labs Reviewed  BASIC METABOLIC PANEL - Abnormal; Notable for the following components:      Result Value   Glucose, Bld 105 (*)    All other components within normal limits  CBC - Abnormal; Notable for the following components:   WBC 12.2 (*)    All other components within normal limits  I-STAT TROPONIN, ED  I-STAT TROPONIN, ED  I-STAT TROPONIN, ED    EKG  EKG  Interpretation  Date/Time:  Friday May 14 2017 05:17:49 EST Ventricular Rate:  92 PR Interval:    QRS Duration: 92 QT Interval:  364 QTC Calculation: 451 R Axis:   58 Text Interpretation:  Sinus rhythm Probable left atrial enlargement Probable anteroseptal infarct, old Minimal ST elevation, inferior leads, unchanged from previous Confirmed by Paula LibraMolpus, John ([id number]) on 05/14/2017 5:54:12 AM Also confirmed by Paula LibraMolpus, John ([id number]), editor Madalyn RobEverhart, Marilyn [phone]([id number])  on 05/14/2017 7:04:35 AM       Radiology Dg Chest 2 View  Result Date: 05/14/2017 CLINICAL DATA:  Acute onset of mid chest pain and headache. EXAM: CHEST  2 VIEW COMPARISON:  Chest radiograph performed 10/15/2016 FINDINGS: The lungs are well-aerated and clear. There is no evidence of focal opacification, pleural effusion or pneumothorax. The heart is normal in size; the mediastinal contour is within normal  limits. No acute osseous abnormalities are seen. IMPRESSION: No acute cardiopulmonary process seen. Electronically Signed   By: Roanna RaiderJeffery  Chang M.D.   On: 05/14/2017 05:50   Dg Cervical Spine Complete  Result Date: 05/14/2017 CLINICAL DATA:  Chest pain and headaches, initial encounter EXAM: CERVICAL SPINE - COMPLETE 4+ VIEW COMPARISON:  07/11/2012 FINDINGS: Seven cervical segments are well visualized. Mild osteophytic changes are noted at C4-5 and C5-6 stable from the prior exam. No prevertebral soft tissue changes are seen. The neural foramina are widely patent bilaterally. The odontoid is within normal limits. IMPRESSION: Stable degenerative change without acute abnormality. Electronically Signed   By: Alcide CleverMark  Lukens M.D.   On: 05/14/2017 08:26   Ct Head Wo Contrast  Result Date: 05/14/2017 CLINICAL DATA:  Headaches and dizziness for several days EXAM: CT HEAD WITHOUT CONTRAST TECHNIQUE: Contiguous axial images were obtained from the base of the skull through the vertex without intravenous contrast. COMPARISON:  02/12/2008  FINDINGS: Brain: No evidence of acute infarction, hemorrhage, hydrocephalus, extra-axial collection or mass lesion/mass effect. Vascular: No hyperdense vessel or unexpected calcification. Skull: Normal. Negative for fracture or focal lesion. Sinuses/Orbits: Air-fluid level is noted within the left maxillary antrum which may be related to the patient's underlying symptomatology. Other: None. IMPRESSION: No acute intracranial abnormality is noted. Air-fluid level in left maxillary antrum. This may contribute to the patient's clinical symptomatology. Electronically Signed   By: Alcide CleverMark  Lukens M.D.   On: 05/14/2017 08:06    Procedures Procedures (including critical care time)  Medications Ordered in ED Medications  HYDROcodone-acetaminophen (NORCO/VICODIN) 5-325 MG per tablet 2 tablet (2 tablets Oral Given 05/14/17 0811)  methocarbamol (ROBAXIN) tablet 500 mg (500 mg Oral Given 05/14/17 [id number])     Initial Impression / Assessment and Plan / ED Course  I have reviewed the triage vital signs and the nursing notes.  Pertinent labs & imaging results that were available during my care of the patient were reviewed by me and considered in my medical decision making (see chart for details).     46 year old male with past medical history as above here with bilateral neck and head pain in the setting of increased physical activity.  He has reproducible paraspinal tenderness as well as tenderness over the sternocleidomastoids bilaterally.  Headache distribution is likely secondary to muscular spasm and tension type headache.  He has no red flags.  No fever, photophobia, stiffness, or signs to suggest meningitis or encephalitis.  He has no sore throat or signs of oropharyngeal abnormality.  He has a nonfocal neurological exam.  CT head is negative.  His symptoms feel markedly improved after muscle relaxant analgesia.  Will treat with outpatient muscle relaxants and outpatient follow-up.  Of note, patient  incidentally states that this pain occasionally radiates down towards his chest.  He has a history of coronary disease but is adamant that the current pain is radiating from his neck and is different from his cardiac pain.  His EKG today is nonischemic with negative troponins x2.  I discussed that strictly speaking, with his history of coronary disease, I would recommend admission, but patient is adamant that the pain in his chest is radiating from his neck and he declines further workup.  I feel this is reasonable.  He just saw his cardiologist within the last 3 months and was cleared for longer follow-up.  We discussed strict return cautions and will discharge home.  Final Clinical Impressions(s) / ED Diagnoses   Final diagnoses:  Neck pain  Chronic tension-type  headache, not intractable    Clinical Impression: 1. Neck pain   2. Chronic tension-type headache, not intractable     Disposition: Discharge  Condition: Good  I have discussed the results, Dx and Tx plan with the pt(& family if present). He/she/they expressed understanding and agree(s) with the plan. Discharge instructions discussed at great length. Strict return precautions discussed and pt &/or family have verbalized understanding of the instructions. No further questions at time of discharge.    This SmartLink is deprecated. Use AVSMEDLIST instead to display the medication list for a patient.  Follow Up: Deneen Harts, FNP [address] [place] [address] [phone]  In 1 week    ED Discharge Orders        Ordered    HYDROcodone-acetaminophen (NORCO/VICODIN) 5-325 MG tablet  Every 6 hours PRN     05/14/17 0958    methocarbamol (ROBAXIN) 500 MG tablet  Every 8 hours PRN     05/14/17 7829       Shaune Pollack, MD 05/14/17 1025

## 2017-05-14 NOTE — ED Notes (Signed)
Patient transported to X-ray 

## 2017-05-14 NOTE — ED Triage Notes (Signed)
Pt reports 4/10 left sided cp and headache. Pt denies sob, pt non-diaphoretic. Pt reports hx of cardiac cath. Pt A+OX4, speaking in complete sentences.

## 2017-05-14 NOTE — Discharge Instructions (Signed)
You have been seen in the Emergency Department (ED)  today for neck pain.  Your workup and exam have not shown any acute abnormalities and you are likely suffering from muscle strain or possible problems with your discs, but there is no treatment that will fix your symptoms at this time.  Please take Motrin (ibuprofen) as needed for your pain according to the instructions written on the box.  Alternatively, for the next five days you can take 600mg  three times daily with meals (it may upset your stomach).  Please follow up with your doctor as soon as possible regarding today's ED visit and your back pain.  Return to the ED for worsening back pain, fever, weakness or numbness of either leg, or if you develop either (1) an inability to urinate or have bowel movements, or (2) loss of your ability to control your bathroom functions (if you start having "accidents"), or if you develop other new symptoms that concern you.

## 2017-05-29 ENCOUNTER — Other Ambulatory Visit: Payer: Self-pay | Admitting: Internal Medicine

## 2017-06-14 ENCOUNTER — Ambulatory Visit: Payer: Medicaid Other | Admitting: Adult Health

## 2017-06-14 NOTE — Progress Notes (Deleted)
Cardiology Office Note   Date:  06/14/2017   ID:  John Mcintyre, DOB 02/11/1971, MRN [id number]  PCP:  Deneen Hartsodd, Elizabeth, FNP  Cardiologist: Dr. Rennis GoldenHilty No chief complaint on file.    History of Present Illness: John Mcintyre is a 47 y.o. male who presents for ongoing assessment and management of chest pain with normal nuclear medicine stress test, normal EF of 52%, no reversible ischemia was seen.  Patient underwent cardiac catheterization on 11/2015 which revealed two-vessel CAD, CTO of the RCA and left-to-right collaterals along with severe proximal LAD stenosis.  The patient has successful PCI to the proximal LAD, and had no further symptoms.  He continued to have intermittent episodes of chest pain and had been seen in the ER twice more for this.  He was ruled out for ACS each time.  He was recently seen in the emergency room on 05/14/2017 for complaints of headache.    History includes neuropathic pain, hypertension, tobacco abuse, hypercholesterolemia, and polio.  Past Medical History:  Diagnosis Date  . Coronary artery disease   . Hypercholesteremia   . Hypertension   . Polio     Past Surgical History:  Procedure Laterality Date  . CARDIAC CATHETERIZATION N/A 12/23/2015   Procedure: Left Heart Cath and Coronary Angiography;  Surgeon: Kathleene Hazelhristopher D McAlhany, MD;  [facility];  Service: Cardiovascular;  Laterality: N/A;  . CARDIAC CATHETERIZATION N/A 12/23/2015   Procedure: Coronary Stent Intervention;  Surgeon: Kathleene Hazelhristopher D McAlhany, MD;  Location: [facility] INVASIVE CV LAB;  Service: Cardiovascular;  Laterality: N/A;  . CORONARY STENT PLACEMENT  12/23/2015   Successful PTCA/DES x 1 proximal LAD  . HIP SURGERY Left 1987   for polio, not sure what was done  . KNEE SURGERY Left 1987   for polio     Current Outpatient Medications  Medication Sig Dispense Refill  . aspirin EC 81 MG tablet Take 81 mg by mouth daily.    . clopidogrel (PLAVIX) 75 MG tablet TAKE 1 TABLET  BY MOUTH DAILY WITH BREAKFAST 90 tablet 2  . HYDROcodone-acetaminophen (NORCO/VICODIN) 5-325 MG tablet Take 1-2 tablets by mouth every 6 (six) hours as needed for moderate pain or severe pain. DO NOT DRIVE WHILE TAKING THIS MEDICATION 12 tablet 0  . lisinopril (PRINIVIL,ZESTRIL) 20 MG tablet TAKE 1 TABLET BY MOUTH EVERY DAY 30 tablet 9  . lovastatin (MEVACOR) 20 MG tablet TAKE 1 TABLET (20 MG TOTAL) BY MOUTH AT BEDTIME. 30 tablet 6  . methocarbamol (ROBAXIN) 500 MG tablet Take 1 tablet (500 mg total) by mouth every 8 (eight) hours as needed for muscle spasms. 30 tablet 0  . metoprolol tartrate (LOPRESSOR) 25 MG tablet TAKE 1 TABLET BY MOUTH TWICE A DAY 60 tablet 9  . nitroGLYCERIN (NITROSTAT) 0.4 MG SL tablet Place 1 tablet (0.4 mg total) under the tongue every 5 (five) minutes as needed for chest pain. 90 tablet 3   No current facility-administered medications for this visit.     Allergies:   Pork-derived products    Social History:  The patient  reports that he has been smoking cigarettes.  He has been smoking about 2.00 packs per day. he has never used smokeless tobacco. He reports that he drinks alcohol. He reports that he does not use drugs.   Family History:  The patient's family history includes Heart failure in his father; Hypertension in his mother.    ROS: All other systems are reviewed and negative. Unless otherwise mentioned in H&P  PHYSICAL EXAM: VS:  There were no vitals taken for this visit. , BMI There is no height or weight on file to calculate BMI. GEN: Well nourished, well developed, in no acute distress  HEENT: normal  Neck: no JVD, carotid bruits, or masses Cardiac: ***RRR; no murmurs, rubs, or gallops,no edema  Respiratory:  clear to auscultation bilaterally, normal work of breathing GI: soft, nontender, nondistended, + BS MS: no deformity or atrophy  Skin: warm and dry, no rash Neuro:  Strength and sensation are intact Psych: euthymic mood, full  affect   EKG:  EKG {ACTION; IS/IS [id number]} ordered today. The ekg ordered today demonstrates ***   Recent Labs: 05/14/2017: BUN 10; Creatinine, Ser 0.65; Hemoglobin 15.5; Platelets 228; Potassium 3.5; Sodium 136    Lipid Panel    Component Value Date/Time   CHOL 158 12/18/2015 1016   TRIG 302 (H) 12/18/2015 1016   HDL 25 (L) 12/18/2015 1016   CHOLHDL 6.3 (H) 12/18/2015 1016   VLDL 60 (H) 12/18/2015 1016   LDLCALC 73 12/18/2015 1016      Wt Readings from Last 3 Encounters:  01/28/17 162 lb (73.5 kg)  01/22/17 162 lb (73.5 kg)  10/15/16 158 lb 6.4 oz (71.8 kg)      Other studies Reviewed: Additional studies/ records that were reviewed today include: ***. Review of the above records demonstrates: ***   ASSESSMENT AND PLAN:  1.  ***   Current medicines are reviewed at length with the patient today.    Labs/ tests ordered today include: *** Bettey Mare. Liborio Nixon, ANP, AACC   06/14/2017 7:39 AM     [facility] [address] [place], [place] [address] Phone: [phone]; Fax: [phone]

## 2017-06-22 ENCOUNTER — Encounter: Payer: Self-pay | Admitting: Adult Health

## 2017-06-23 ENCOUNTER — Ambulatory Visit: Payer: Medicaid Other | Admitting: Adult Health

## 2017-06-23 NOTE — Progress Notes (Deleted)
Cardiology Office Note   Date:  06/23/2017   ID:  John Mcintyre, DOB 1971-01-11, MRN [id number]  PCP:  Deneen Harts, FNP  Cardiologist:  Dr.  Rennis Golden No chief complaint on file.    History of Present Illness: John Mcintyre is a 47 y.o. male who presents for ongoing assessment and management of chest pain of CAD with successful PTCA/DES X 1 to proximal LAD, CTO of the proximal RCA, with collaterals 12/23/2015. Other history includes hypercholesterolemia, and hypertriglyceridemia and polio.   He unfortunately continues to smoke. When last seen by Dr. Rennis Golden, he was taken of of Plavix as it has been one year.    Past Medical History:  Diagnosis Date  . Coronary artery disease   . Hypercholesteremia   . Hypertension   . Polio     Past Surgical History:  Procedure Laterality Date  . CARDIAC CATHETERIZATION N/A 12/23/2015   Procedure: Left Heart Cath and Coronary Angiography;  Surgeon: Kathleene Hazel, MD;  Location: [facility] INVASIVE CV LAB;  Service: Cardiovascular;  Laterality: N/A;  . CARDIAC CATHETERIZATION N/A 12/23/2015   Procedure: Coronary Stent Intervention;  Surgeon: Kathleene Hazel, MD;  Location: [facility] INVASIVE CV LAB;  Service: Cardiovascular;  Laterality: N/A;  . CORONARY STENT PLACEMENT  12/23/2015   Successful PTCA/DES x 1 proximal LAD  . HIP SURGERY Left 1987   for polio, not sure what was done  . KNEE SURGERY Left 1987   for polio     Current Outpatient Medications  Medication Sig Dispense Refill  . aspirin EC 81 MG tablet Take 81 mg by mouth daily.    . clopidogrel (PLAVIX) 75 MG tablet TAKE 1 TABLET BY MOUTH DAILY WITH BREAKFAST 90 tablet 2  . HYDROcodone-acetaminophen (NORCO/VICODIN) 5-325 MG tablet Take 1-2 tablets by mouth every 6 (six) hours as needed for moderate pain or severe pain. DO NOT DRIVE WHILE TAKING THIS MEDICATION 12 tablet 0  . lisinopril (PRINIVIL,ZESTRIL) 20 MG tablet TAKE 1 TABLET BY MOUTH EVERY DAY 30 tablet 9  . lovastatin (MEVACOR) 20  MG tablet TAKE 1 TABLET (20 MG TOTAL) BY MOUTH AT BEDTIME. 30 tablet 6  . methocarbamol (ROBAXIN) 500 MG tablet Take 1 tablet (500 mg total) by mouth every 8 (eight) hours as needed for muscle spasms. 30 tablet 0  . metoprolol tartrate (LOPRESSOR) 25 MG tablet TAKE 1 TABLET BY MOUTH TWICE A DAY 60 tablet 9  . nitroGLYCERIN (NITROSTAT) 0.4 MG SL tablet Place 1 tablet (0.4 mg total) under the tongue every 5 (five) minutes as needed for chest pain. 90 tablet 3   No current facility-administered medications for this visit.     Allergies:   Pork-derived products    Social History:  The patient  reports that he has been smoking cigarettes.  He has been smoking about 2.00 packs per day. he has never used smokeless tobacco. He reports that he drinks alcohol. He reports that he does not use drugs.   Family History:  The patient's family history includes Heart failure in his father; Hypertension in his mother.    ROS: All other systems are reviewed and negative. Unless otherwise mentioned in H&P    PHYSICAL EXAM: VS:  There were no vitals taken for this visit. , BMI There is no height or weight on file to calculate BMI. GEN: Well nourished, well developed, in no acute distress  HEENT: normal  Neck: no JVD, carotid bruits, or masses Cardiac: ***RRR; no murmurs, rubs, or gallops,no edema  Respiratory:  clear to auscultation bilaterally, normal work of breathing GI: soft, nontender, nondistended, + BS MS: no deformity or atrophy  Skin: warm and dry, no rash Neuro:  Strength and sensation are intact Psych: euthymic mood, full affect   EKG:  EKG {ACTION; IS/IS [id number]} ordered today. The ekg ordered today demonstrates ***   Recent Labs: 05/14/2017: BUN 10; Creatinine, Ser 0.65; Hemoglobin 15.5; Platelets 228; Potassium 3.5; Sodium 136    Lipid Panel    Component Value Date/Time   CHOL 158 12/18/2015 1016   TRIG 302 (H) 12/18/2015 1016   HDL 25 (L) 12/18/2015 1016   CHOLHDL 6.3  (H) 12/18/2015 1016   VLDL 60 (H) 12/18/2015 1016   LDLCALC 73 12/18/2015 1016      Wt Readings from Last 3 Encounters:  01/28/17 162 lb (73.5 kg)  01/22/17 162 lb (73.5 kg)  10/15/16 158 lb 6.4 oz (71.8 kg)      Other studies Reviewed: Additional studies/ records that were reviewed today include: ***. Review of the above records demonstrates: ***   ASSESSMENT AND PLAN:  1.  ***   Current medicines are reviewed at length with the patient today.    Labs/ tests ordered today include: *** Bettey MareKathryn M. Liborio NixonLawrence DNP, ANP, AACC   06/23/2017 8:17 AM    [facility] [address], [place], [place] [address] Phone: [phone]; Fax: [phone]

## 2017-06-24 ENCOUNTER — Encounter: Payer: Self-pay | Admitting: *Deleted

## 2017-06-29 ENCOUNTER — Other Ambulatory Visit: Payer: Self-pay | Admitting: Nurse Practitioner

## 2017-06-29 ENCOUNTER — Ambulatory Visit (INDEPENDENT_AMBULATORY_CARE_PROVIDER_SITE_OTHER): Payer: Medicaid Other | Admitting: Internal Medicine

## 2017-06-29 ENCOUNTER — Encounter: Payer: Self-pay | Admitting: Internal Medicine

## 2017-06-29 VITALS — BP 148/96 | HR 101 | Ht 65.0 in | Wt 164.6 lb

## 2017-06-29 DIAGNOSIS — I2510 Atherosclerotic heart disease of native coronary artery without angina pectoris: Secondary | ICD-10-CM | POA: Diagnosis not present

## 2017-06-29 DIAGNOSIS — M792 Neuralgia and neuritis, unspecified: Secondary | ICD-10-CM

## 2017-06-29 DIAGNOSIS — A809 Acute poliomyelitis, unspecified: Secondary | ICD-10-CM | POA: Diagnosis not present

## 2017-06-29 DIAGNOSIS — I10 Essential (primary) hypertension: Secondary | ICD-10-CM | POA: Diagnosis not present

## 2017-06-29 DIAGNOSIS — Z9861 Coronary angioplasty status: Secondary | ICD-10-CM | POA: Diagnosis not present

## 2017-06-29 NOTE — Patient Instructions (Signed)
You have been referred to [facility] [address], [place], [place] [address] [phone]   Dr. Rennis GoldenHilty has recommended follow up with the neurologist  and Natasha MeadZachary Theodore Swartz, MD (Physical Medicine & Rehabilitation specialist - phone # [phone])   Your physician wants you to follow-up in: 6 months with Dr. Rennis GoldenHilty. You will receive a reminder letter in the mail two months in advance. If you don't receive a letter, please call our office to schedule the follow-up appointment.

## 2017-06-29 NOTE — Telephone Encounter (Signed)
Dr. Blanchie DessertHilty's patient

## 2017-06-29 NOTE — Progress Notes (Signed)
OFFICE NOTE  Chief Complaint:  Leg weakness, chest pain and vision problems  Primary Care Physician: Deneen Hartsodd, Elizabeth, FNP  HPI:  John Mcintyre is a pleasant 47 year old male [place] He says for almost all of his life he had polio and this affected his legs significantly causing weakness in the need to use crutches to walk. He currently works as a Scientist, physiologicalcab driver and is not physically active. Recently he's been having worsening pain in his upper chest shoulders and down both arms. Some the pain is in the back of his neck and even radiates to the right half of his face. While some of those symptoms certainly sound neurologic, he also has some associated fatigue and shortness of breath. He is a smoker and has a history of hypertension and dyslipidemia which is not been well controlled. He was recently started on lovastatin. Triglycerides were noted to be elevated greater than 700 and recently came down to the 300s. His father has a history of heart disease and sounds like he died from heart failure in his 6850s. I had the pleasure seeing John Mcintyre back in the office today. He underwent a nuclear stress test which was interpreted as low risk. There was a very small distal anteroapical fixed defect which was suggestive of possible scar. EF was low normal at 52%. No reversible ischemia was seen. He reports no further significant symptoms. Subsequently he presented with worsening chest pain and was referred by Norma FredricksonLori Gerhardt, NP for cardiac catheterization in 11/2015. 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 There was a CTO of the RCA with left to right collaterals and severe proximal LAD stenosis. He had successful PCI to the proximal LAD and was chest pain free thereafter.   04/20/2016  He was seen in the ER yesterday with recurrent chest pain for the past 2 days which is similar to his prior anginal symptoms. He reports compliance with his medications, but did not  take it this morning. BP was 174/113 on admission. He was seen in the office in 12/2015 and his lisinopril was increased to 10 mg daily. Initial troponin was negative. Labs indicate a mildly elevated WBC count at 11.3. CXR unremarkable. EKG shows NSR without ischemic changes. He ruled out for acute MI and I increased his lisinopril to 20 mg daily. He said he try to get to the pharmacy for that prescription, but was somewhat confused that I wanted him to take twice the dose that he currently takes. This morning he took 10 mg of lisinopril blood pressure was 152/90. He denies any further chest pain. He took Aleve over-the-counter and reports improvement in his neck pain and shoulder pain. He continues to have difficulty ambulating, particularly with worsening weakness of his legs secondary to polio.  10/15/2016  John Mcintyre returns for follow-up. He was seen in the ER last night and discharged early this morning for chest pain. He recently called our office about this. He describes pain all across his chest and upper back and into his arms. This pain is completely different than the pain he had prior to his cardiac stent placement. 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 Troponin was negative. He was discharged for follow-up with us. He feels that his pain may be related to using crutches. 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 He may have neuropathic or musculoskeletal pain related to that. He is continuing on aspirin and Plavix which is more than adequate treatment for his coronary disease. He also complained of visual changes today including difficulty reading. I suspect this is presbyopia however at times he feels that he has some numbness on one half of his face and other unusual symptoms. He does report very poor sleep at night as well. He denies any snoring or  apnea but gets less than 6 hours of sleep. He also tried Chantix for smoking cessation but said that it worsened his pain and he stopped taking it. He also felt that it made it more difficult for him to sleep.  01/22/2017  John Mcintyre returns for follow-up. He reports she's had no further chest pain. He continues to have problems with leg weakness secondary to history of polio. He has had significant muscle wasting particularly in the left hip and more difficulty walking. He is inquiring about bracing. I referred him to neurology however the wait was so long he did not take the appointment. He is more than 1 year out from stenting and I believe could be taken off of Plavix. Blood pressure is at goal today.  06/29/2017  John Mcintyre today in follow-up.  He reports some chest pain.  This is not similar to his pain with his recent stent implantation in July 2017.  He completed a year of Plavix and now is on aspirin.  Some of his symptoms seem to be worse with muscle use and he as required braces in order to support his upper body.  At his last office visit he inquired about additional bracing and I referred him to Dr. Hermelinda Medicus with physical medicine and rehabilitation.  Unfortunately he never made that appointment.  He is also been referred to Dr. Allena Katz with neurology twice but canceled the appointment since they were scheduled 3 months out.  He said he either missed the appointments or did not have enough patients for them.  That being said he is asking for a referral again today.  He also says he is having trouble with his vision and I advised him to contact an ophthalmologist.  PMHx:  Past Medical History:  Diagnosis Date  . Coronary artery disease   . Hypercholesteremia   . Hypertension   . Polio     Past Surgical History:  Procedure Laterality Date  . CARDIAC CATHETERIZATION N/A 12/23/2015   Procedure: Left Heart Cath and Coronary Angiography;  Surgeon: Kathleene Hazel, MD;  Location: [facility]  INVASIVE CV LAB;  Service: Cardiovascular;  Laterality: N/A;  . CARDIAC CATHETERIZATION N/A 12/23/2015   Procedure: Coronary Stent Intervention;  Surgeon: Kathleene Hazel, MD;  Location: [facility] INVASIVE CV LAB;  Service: Cardiovascular;  Laterality: N/A;  . CORONARY STENT PLACEMENT  12/23/2015   Successful PTCA/DES x 1 proximal LAD  . HIP SURGERY Left 1987   for polio, not sure what was done  . KNEE SURGERY Left 1987   for polio    FAMHx:  Family History  Problem Relation Age of Onset  . Hypertension Mother   . Heart failure Father     SOCHx:   reports that he has been smoking cigarettes.  He has been smoking about 2.00 packs per day. he has never used smokeless tobacco. He reports that he drinks alcohol. He reports that he does not use drugs.  ALLERGIES:  Allergies  Allergen Reactions  . Pork-Derived Products  Other (See Comments)    Patient does not eat pork because of his culture    ROS: Pertinent items noted in HPI and remainder of comprehensive ROS otherwise negative.  HOME MEDS: Current Outpatient Medications  Medication Sig Dispense Refill  . aspirin EC 81 MG tablet Take 81 mg by mouth daily.    . clopidogrel (PLAVIX) 75 MG tablet TAKE 1 TABLET BY MOUTH DAILY WITH BREAKFAST 90 tablet 2  . HYDROcodone-acetaminophen (NORCO/VICODIN) 5-325 MG tablet Take 1-2 tablets by mouth every 6 (six) hours as needed for moderate pain or severe pain. DO NOT DRIVE WHILE TAKING THIS MEDICATION 12 tablet 0  . lisinopril (PRINIVIL,ZESTRIL) 20 MG tablet TAKE 1 TABLET BY MOUTH EVERY DAY 30 tablet 9  . lovastatin (MEVACOR) 20 MG tablet TAKE 1 TABLET (20 MG TOTAL) BY MOUTH AT BEDTIME. 30 tablet 6  . metoprolol tartrate (LOPRESSOR) 25 MG tablet TAKE 1 TABLET BY MOUTH TWICE A DAY 60 tablet 9  . nitroGLYCERIN (NITROSTAT) 0.4 MG SL tablet Place 1 tablet (0.4 mg total) under the tongue every 5 (five) minutes as needed for chest pain. 90 tablet 3   No current facility-administered medications for  this visit.     LABS/IMAGING: No results found for this or any previous visit (from the past 48 hour(s)). No results found.  WEIGHTS: Wt Readings from Last 3 Encounters:  06/29/17 164 lb 9.6 oz (74.7 kg)  01/28/17 162 lb (73.5 kg)  01/22/17 162 lb (73.5 kg)    VITALS: BP (!) 148/96   Pulse (!) 101   Ht 5\' 5"  (1.651 m)   Wt 164 lb 9.6 oz (74.7 kg)   BMI 27.39 kg/m   EXAM: General appearance: alert and no distress Neck: no carotid bruit and no JVD Lungs: clear to auscultation bilaterally Heart: regular rate and rhythm Abdomen: soft, non-tender; bowel sounds normal; no masses,  no organomegaly Extremities: Peripheral muscle wasting secondary to polio Pulses: 2+ and symmetric Skin: Skin color, texture, turgor normal. No rashes or lesions Neurologic: Grossly normal Psych: Pleasant  EKG: Sinus tachycardia at 101-personally reviewed  ASSESSMENT: 1. Chest pain- atypical, sounds neuropathic in nature- recent PCI to the LAD (11/2015) and noted CTO of the RCA with left to right collaterals 2. Hypertension 3. Dyslipidemia 4. Tobacco abuse 5. 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   John Mcintyre continues to have what more likely neuropathic chest pain although did have coronary artery disease.  His symptoms are not worse with exertion but seem to be worse when supporting his weight.  I think his braces are in adequate and would benefit from evaluation.  I had referred him previously to Dr. Hermelinda Medicus but he never made the appointment.  We will provide him the number to call and make the appointment today.  We will also refer him to [facility] neurology for evaluation of post polio syndrome.  He is advised to contact an ophthalmologist for his vision problems.  Follow-up with me in 6 months.  Chrystie Nose, MD, [facility], FACP  [place]  [facility]  Medical Director of the Advanced Lipid Disorders &  Cardiovascular Risk  Reduction Clinic Diplomate of the American Board of Clinical Lipidology Attending Cardiologist  Direct Dial: [phone]  Fax: [phone]  Website:  [email or web address] 06/29/2017, 5:49 PM

## 2017-07-09 ENCOUNTER — Telehealth: Payer: Self-pay | Admitting: Internal Medicine

## 2017-07-09 NOTE — Telephone Encounter (Signed)
Message received from Dr. Rosalyn ChartersSwartz's office regarding referral:   Message  Received: 2 days ago  Message Contents  [name], [name]  [name], RN        We have tried reaching patient to set up an appointment, and haven't heard back from patient. We will be closing the referral at this time. Thank you.

## 2017-07-29 ENCOUNTER — Telehealth: Payer: Self-pay | Admitting: Internal Medicine

## 2017-07-29 NOTE — Telephone Encounter (Signed)
New message  Patient calling with concerns of dizziness, left arm pain, sometimes SOB.   Pt c/o of Chest Pain: STAT if CP now or developed within 24 hours  1. Are you having CP right now? NO  2. Are you experiencing any other symptoms (ex. SOB, nausea, vomiting, sweating)? SOB  3. How long have you been experiencing CP? 7 days  4. Is your CP continuous or coming and going? Coming and going  5. Have you taken Nitroglycerin? YES ?

## 2017-07-29 NOTE — Telephone Encounter (Signed)
Unable to reach pt or leave a message  

## 2017-08-02 NOTE — Telephone Encounter (Signed)
Unable to reach pt or leave a message  

## 2017-08-03 ENCOUNTER — Ambulatory Visit: Payer: Medicaid Other | Admitting: Physician Assistant

## 2017-08-03 NOTE — Progress Notes (Deleted)
Cardiology Office Note    Date:  08/03/2017   ID:  John Mcintyre, DOB [birth year], MRN [id number]  PCP:  Deneen Harts, FNP  Cardiologist:  ***   No chief complaint on file.   History of Present Illness:  John Mcintyre is a 47 y.o. male ***    Past Medical History:  Diagnosis Date  . Coronary artery disease   . Hypercholesteremia   . Hypertension   . Polio     Past Surgical History:  Procedure Laterality Date  . CARDIAC CATHETERIZATION N/A 12/23/2015   Procedure: Left Heart Cath and Coronary Angiography;  Surgeon: Kathleene Hazel, MD;  Location: [facility] INVASIVE CV LAB;  Service: Cardiovascular;  Laterality: N/A;  . CARDIAC CATHETERIZATION N/A 12/23/2015   Procedure: Coronary Stent Intervention;  Surgeon: Kathleene Hazel, MD;  Location: [facility] INVASIVE CV LAB;  Service: Cardiovascular;  Laterality: N/A;  . CORONARY STENT PLACEMENT  12/23/2015   Successful PTCA/DES x 1 proximal LAD  . HIP SURGERY Left 1987   for polio, not sure what was done  . KNEE SURGERY Left 1987   for polio    Current Medications: Outpatient Medications Prior to Visit  Medication Sig Dispense Refill  . aspirin EC 81 MG tablet Take 81 mg by mouth daily.    . clopidogrel (PLAVIX) 75 MG tablet TAKE 1 TABLET BY MOUTH DAILY WITH BREAKFAST 90 tablet 2  . HYDROcodone-acetaminophen (NORCO/VICODIN) 5-325 MG tablet Take 1-2 tablets by mouth every 6 (six) hours as needed for moderate pain or severe pain. DO NOT DRIVE WHILE TAKING THIS MEDICATION 12 tablet 0  . lisinopril (PRINIVIL,ZESTRIL) 20 MG tablet TAKE 1 TABLET BY MOUTH EVERY DAY 30 tablet 9  . lovastatin (MEVACOR) 20 MG tablet TAKE 1 TABLET (20 MG TOTAL) BY MOUTH AT BEDTIME. 30 tablet 6  . metoprolol tartrate (LOPRESSOR) 25 MG tablet TAKE 1 TABLET BY MOUTH TWICE A DAY 180 tablet 3  . nitroGLYCERIN (NITROSTAT) 0.4 MG SL tablet Place 1 tablet (0.4 mg total) under the tongue every 5 (five) minutes as needed for chest pain. 90 tablet 3   No  facility-administered medications prior to visit.      Allergies:   Pork-derived products   Social History   Socioeconomic History  . Marital status: Married    Spouse name: Not on file  . Number of children: Not on file  . Years of education: Not on file  . Highest education level: Not on file  Social Needs  . Financial resource strain: Not on file  . Food insecurity - worry: Not on file  . Food insecurity - inability: Not on file  . Transportation needs - medical: Not on file  . Transportation needs - non-medical: Not on file  Occupational History  . Not on file  Tobacco Use  . Smoking status: Current Every Day Smoker    Packs/day: 2.00    Types: Cigarettes  . Smokeless tobacco: Never Used  Substance and Sexual Activity  . Alcohol use: Yes    Comment: occ  . Drug use: No  . Sexual activity: Not on file  Other Topics Concern  . Not on file  Social History Narrative  . Not on file     Family History:  The patient's ***family history includes Heart failure in his father; Hypertension in his mother.   ROS:   Please see the history of present illness.    ROS All other systems reviewed and are negative.   PHYSICAL EXAM:  VS:  There were no vitals taken for this visit.   GEN: Well nourished, well developed, in no acute distress HEENT: normal Neck: no JVD, carotid bruits, or masses Cardiac: ***RRR; no murmurs, rubs, or gallops,no edema  Respiratory:  clear to auscultation bilaterally, normal work of breathing GI: soft, nontender, nondistended, + BS MS: no deformity or atrophy Skin: warm and dry, no rash Neuro:  Alert and Oriented x 3, Strength and sensation are intact Psych: euthymic mood, full affect  Wt Readings from Last 3 Encounters:  06/29/17 164 lb 9.6 oz (74.7 kg)  01/28/17 162 lb (73.5 kg)  01/22/17 162 lb (73.5 kg)      Studies/Labs Reviewed:   EKG:  EKG is*** ordered today.  The ekg ordered today demonstrates ***  Recent Labs: 05/14/2017: BUN  10; Creatinine, Ser 0.65; Hemoglobin 15.5; Platelets 228; Potassium 3.5; Sodium 136   Lipid Panel    Component Value Date/Time   CHOL 158 12/18/2015 1016   TRIG 302 (H) 12/18/2015 1016   HDL 25 (L) 12/18/2015 1016   CHOLHDL 6.3 (H) 12/18/2015 1016   VLDL 60 (H) 12/18/2015 1016   LDLCALC 73 12/18/2015 1016    Additional studies/ records that were reviewed today include:  ***    ASSESSMENT:    No diagnosis found.   PLAN:  In order of problems listed above:  1. ***    Medication Adjustments/Labs and Tests Ordered: Current medicines are reviewed at length with the patient today.  Concerns regarding medicines are outlined above.  Medication changes, Labs and Tests ordered today are listed in the Patient Instructions below. There are no Patient Instructions on file for this visit.   Ramond DialSigned, Timberlynn Kizziah, [place]  08/03/2017 1:38 PM    [facility] [address], [place], [place]  [address] Phone: [phone]; Fax: [phone]

## 2017-08-04 ENCOUNTER — Encounter: Payer: Self-pay | Admitting: Physician Assistant

## 2017-08-04 ENCOUNTER — Ambulatory Visit (INDEPENDENT_AMBULATORY_CARE_PROVIDER_SITE_OTHER): Payer: Medicaid Other | Admitting: Physician Assistant

## 2017-08-04 VITALS — BP 164/106 | HR 75 | Ht 65.0 in | Wt 168.0 lb

## 2017-08-04 DIAGNOSIS — R0789 Other chest pain: Secondary | ICD-10-CM | POA: Diagnosis not present

## 2017-08-04 DIAGNOSIS — Z8612 Personal history of poliomyelitis: Secondary | ICD-10-CM | POA: Diagnosis not present

## 2017-08-04 DIAGNOSIS — E785 Hyperlipidemia, unspecified: Secondary | ICD-10-CM | POA: Diagnosis not present

## 2017-08-04 DIAGNOSIS — Z9861 Coronary angioplasty status: Secondary | ICD-10-CM | POA: Diagnosis not present

## 2017-08-04 DIAGNOSIS — I10 Essential (primary) hypertension: Secondary | ICD-10-CM

## 2017-08-04 DIAGNOSIS — I2510 Atherosclerotic heart disease of native coronary artery without angina pectoris: Secondary | ICD-10-CM

## 2017-08-04 DIAGNOSIS — Z720 Tobacco use: Secondary | ICD-10-CM

## 2017-08-04 NOTE — Progress Notes (Addendum)
Cardiology Office Note    Date:  08/04/2017   ID:  John Garnetasih Grider, DOB 07/06/1970, MRN [id number]  PCP:  Burnis MedinFulbright, [place] E, PA-C  Cardiologist: Dr. Rennis GoldenHilty   Chief Complaint  Patient presents with  . Follow-up    seen for Dr. Rennis GoldenHilty, chest pain    History of Present Illness:  John Mcintyre is a 47 y.o. male with PMH of leg weakness related to remote h/o polio, HTN, HLD and CAD.  Dr. Rennis GoldenHilty has been seen the patient since 2016.  Triglyceride was greater than 700 however came down since then.  Myoview on 10/18/2014 was mildly abnormal with small anterior scar with mild peri-infarct ischemia.  Cardiac catheterization performed on 12/23/2015 showed 100% proximal RCA with left-to-right collaterals, 40% proximal left circumflex, 40% distal left circumflex, 95% proximal LAD lesion treated with DES x1, EF 45-50%.  He was last seen by Dr. Rennis GoldenHilty in January 2019, he did have some chest pain in the time, however was felt to be neurologic in nature.  He was referred to neurology service.  He was evaluated by his primary care provider on 07/21/2017 for chest pain as well, patient was set up for a cardiology office visit initially on 08/03/2017, however he did not show.  He is presenting today for evaluation of chest pain.  He described it as a left-sided chest pain radiating down the left arm.  He is crutches to walk at baseline.  He is not sure if his nerve pain versus cardiac chest pain.  He says it occurred about 10 days ago, and occurred intermittently in the lasted for a total 4-5 days.  He has not re-experienced any symptom in the past 5 days.  Otherwise he has no lower extremity edema, orthopnea or PND.  I recommended a Lexiscan Myoview.  Note his previous Myoview in 2016 showed small anterior scar, as long as the size of the scar has not changed, no further workup is needed.  He says the recent symptom is different from what he experienced in 2017.  It occurs both with exertion and at rest.   Past Medical  History:  Diagnosis Date  . Coronary artery disease   . Hypercholesteremia   . Hypertension   . Polio     Past Surgical History:  Procedure Laterality Date  . CARDIAC CATHETERIZATION N/A 12/23/2015   Procedure: Left Heart Cath and Coronary Angiography;  Surgeon: Kathleene Hazelhristopher D McAlhany, MD;  [facility];  Service: Cardiovascular;  Laterality: N/A;  . CARDIAC CATHETERIZATION N/A 12/23/2015   Procedure: Coronary Stent Intervention;  Surgeon: Kathleene Hazelhristopher D McAlhany, MD;  Location: [facility] INVASIVE CV LAB;  Service: Cardiovascular;  Laterality: N/A;  . CORONARY STENT PLACEMENT  12/23/2015   Successful PTCA/DES x 1 proximal LAD  . HIP SURGERY Left 1987   for polio, not sure what was done  . KNEE SURGERY Left 1987   for polio    Current Medications: Outpatient Medications Prior to Visit  Medication Sig Dispense Refill  . aspirin EC 81 MG tablet Take 81 mg by mouth daily.    [name]. lisinopril (PRINIVIL,ZESTRIL) 20 MG tablet TAKE 1 TABLET BY MOUTH EVERY DAY 30 tablet 9  . lovastatin (MEVACOR) 20 MG tablet TAKE 1 TABLET (20 MG TOTAL) BY MOUTH AT BEDTIME. 30 tablet 6  . metoprolol tartrate (LOPRESSOR) 25 MG tablet TAKE 1 TABLET BY MOUTH TWICE A DAY 180 tablet 3  . nitroGLYCERIN (NITROSTAT) 0.4 MG SL tablet Place 1 tablet (0.4 mg total) under  the tongue every 5 (five) minutes as needed for chest pain. 90 tablet 3  . clopidogrel (PLAVIX) 75 MG tablet TAKE 1 TABLET BY MOUTH DAILY WITH BREAKFAST 90 tablet 2  . HYDROcodone-acetaminophen (NORCO/VICODIN) 5-325 MG tablet Take 1-2 tablets by mouth every 6 (six) hours as needed for moderate pain or severe pain. DO NOT DRIVE WHILE TAKING THIS MEDICATION 12 tablet 0   No facility-administered medications prior to visit.      Allergies:   Poractant alfa; Other; and Pork-derived products   Social History   Socioeconomic History  . Marital status: Married    Spouse name: None  . Number of children: None  . Years of education: None  . Highest  education level: None  Social Needs  . Financial resource strain: None  . Food insecurity - worry: None  . Food insecurity - inability: None  . Transportation needs - medical: None  . Transportation needs - non-medical: None  Occupational History  . None  Tobacco Use  . Smoking status: Current Every Day Smoker    Packs/day: 2.00    Types: Cigarettes  . Smokeless tobacco: Never Used  . Tobacco comment: ultra lights  Substance and Sexual Activity  . Alcohol use: Yes    Comment: occ  . Drug use: No  . Sexual activity: None  Other Topics Concern  . None  Social History Narrative  . None     Family History:  The patient's family history includes Heart failure in his father; Hypertension in his mother.   ROS:   Please see the history of present illness.    ROS All other systems reviewed and are negative.   PHYSICAL EXAM:   VS:  BP (!) 164/106   Pulse 75   Ht 5\' 5"  (1.651 m)   Wt 168 lb (76.2 kg)   BMI 27.96 kg/m    GEN: Well nourished, well developed, in no acute distress  HEENT: normal  Neck: no JVD, carotid bruits, or masses Cardiac: RRR; no murmurs, rubs, or gallops,no edema  Respiratory:  clear to auscultation bilaterally, normal work of breathing GI: soft, nontender, nondistended, + BS MS: no deformity or atrophy  Skin: warm and dry, no rash Neuro:  Alert and Oriented x 3, Strength and sensation are intact Psych: euthymic mood, full affect  Wt Readings from Last 3 Encounters:  08/04/17 168 lb (76.2 kg)  06/29/17 164 lb 9.6 oz (74.7 kg)  01/28/17 162 lb (73.5 kg)      Studies/Labs Reviewed:   EKG:  EKG is ordered today.  The ekg ordered today demonstrates normal sinus rhythm, Q waves in V1 and V2  Recent Labs: 05/14/2017: BUN 10; Creatinine, Ser 0.65; Hemoglobin 15.5; Platelets 228; Potassium 3.5; Sodium 136   Lipid Panel    Component Value Date/Time   CHOL 158 12/18/2015 1016   TRIG 302 (H) 12/18/2015 1016   HDL 25 (L) 12/18/2015 1016   CHOLHDL  6.3 (H) 12/18/2015 1016   VLDL 60 (H) 12/18/2015 1016   LDLCALC 73 12/18/2015 1016    Additional studies/ records that were reviewed today include:   Cath 12/23/2015 Conclusion     Prox RCA lesion, 100 %stenosed.  Prox Cx lesion, 40 %stenosed.  Dist Cx lesion, 40 %stenosed.  There is mild left ventricular systolic dysfunction.  LV end diastolic pressure is normal.  The left ventricular ejection fraction is 45-50% by visual estimate.  There is no mitral valve regurgitation.  A drug eluting stent was successfully placed.  Prox LAD lesion, 95 %stenosed.  Post intervention, there is a 0% residual stenosis.   1. Double vessel CAD with unstable angina.  2. Chronic occlusion of the proximal RCA with filling of the mid and distal vessel from left to right collaterals.  3. Severe stenosis proximal LAD 4. Moderate proximal Circumflex stenosis 5. Successful PTCA/DES x 1 proximal LAD 6. Mild LV systolic dysfunction  Recommendations: Will continue ASA, Plavix for one year. Continue beta blocker and statin. Would consider changing to high dose Lipitor at discharge. Smoking cessation.       ASSESSMENT:    1. CAD S/P LAD DES 12/23/15   2. Other chest pain   3. Essential hypertension   4. Hyperlipidemia, unspecified hyperlipidemia type   5. H/O post-polio syndrome   6. Tobacco abuse      PLAN:  In order of problems listed above:  1. Chest pain: History of DES to LAD in July 2017.  He had intermittent chest pain for 4-5 days started 10 days ago.  This has resolved.  The symptom occurs both at rest and with exertion.  I recommended a Lexiscan Myoview.  Note his previous Myoview in 2016 showed a small anterior scar, as long as there is no significant change on the current Myoview, I do not think any further workup is needed.  2. Hypertension: Blood pressure elevated today, however his blood pressure was actually borderline low when he saw his primary care provider last week.   I will hold off on adjusting the blood pressure medication  3. Hyperlipidemia: On lovastatin.  Consider repeat lipid panel on the next visit.  4. History of polio: Ambulate with crutches  5. Tobacco abuse: He will need to stop smoking, we had a long conversation today regarding tobacco cessation    Medication Adjustments/Labs and Tests Ordered: Current medicines are reviewed at length with the patient today.  Concerns regarding medicines are outlined above.  Medication changes, Labs and Tests ordered today are listed in the Patient Instructions below. Patient Instructions  Schedule Lexiscan Myoview     No medications to hold      Your physician wants you to follow-up in: 6 months with Dr.Hilty. You will receive a reminder letter in the mail two months in advance. If you don't receive a letter, please call our office to schedule the follow-up appointment.        Ramond Dial, [place]  08/04/2017 10:05 AM    [facility] [address] [place], [place], [place]  [address] Phone: [phone]; Fax: [phone]

## 2017-08-04 NOTE — Patient Instructions (Signed)
Schedule Lexiscan Myoview     No medications to hold      Your physician wants you to follow-up in: 6 months with Dr.Hilty. You will receive a reminder letter in the mail two months in advance. If you don't receive a letter, please call our office to schedule the follow-up appointment.

## 2017-08-05 ENCOUNTER — Telehealth (HOSPITAL_COMMUNITY): Payer: Self-pay

## 2017-08-05 NOTE — Telephone Encounter (Signed)
Unable to reach. I will attempt again at a later time. Encounter complete. 

## 2017-08-06 ENCOUNTER — Telehealth (HOSPITAL_COMMUNITY): Payer: Self-pay

## 2017-08-06 NOTE — Telephone Encounter (Signed)
Encounter complete. 

## 2017-08-10 ENCOUNTER — Telehealth (HOSPITAL_COMMUNITY): Payer: Self-pay

## 2017-08-10 ENCOUNTER — Ambulatory Visit (HOSPITAL_COMMUNITY)
Admission: RE | Admit: 2017-08-10 | Discharge: 2017-08-10 | Disposition: A | Discharge status: Home / Self Care | Payer: Medicaid Other | Source: Ambulatory Visit | Attending: Cardiovascular Disease | Admitting: Cardiovascular Disease

## 2017-08-10 DIAGNOSIS — Z9861 Coronary angioplasty status: Principal | ICD-10-CM

## 2017-08-10 DIAGNOSIS — R0789 Other chest pain: Secondary | ICD-10-CM

## 2017-08-10 DIAGNOSIS — I2510 Atherosclerotic heart disease of native coronary artery without angina pectoris: Secondary | ICD-10-CM

## 2017-08-10 NOTE — Telephone Encounter (Signed)
Encounter complete. 

## 2017-08-10 NOTE — Telephone Encounter (Signed)
Pt was given instructions verbal prior to leaving office today. Pt was given instruction on paper as well. I attempted to reach pt to confirm incoming time for appointment. I was unable to reach patient.  Encounter complete.

## 2017-08-11 ENCOUNTER — Ambulatory Visit (HOSPITAL_COMMUNITY)
Admission: RE | Admit: 2017-08-11 | Discharge: 2017-08-11 | Disposition: A | Discharge status: Home / Self Care | Payer: Medicaid Other | Source: Ambulatory Visit | Attending: Cardiovascular Disease | Admitting: Cardiovascular Disease

## 2017-08-11 DIAGNOSIS — I2510 Atherosclerotic heart disease of native coronary artery without angina pectoris: Secondary | ICD-10-CM

## 2017-08-11 DIAGNOSIS — I2589 Other forms of chronic ischemic heart disease: Secondary | ICD-10-CM | POA: Insufficient documentation

## 2017-08-11 DIAGNOSIS — R0789 Other chest pain: Secondary | ICD-10-CM

## 2017-08-11 DIAGNOSIS — F17200 Nicotine dependence, unspecified, uncomplicated: Secondary | ICD-10-CM | POA: Insufficient documentation

## 2017-08-11 DIAGNOSIS — Z9861 Coronary angioplasty status: Secondary | ICD-10-CM | POA: Diagnosis not present

## 2017-08-11 DIAGNOSIS — I10 Essential (primary) hypertension: Secondary | ICD-10-CM | POA: Diagnosis not present

## 2017-08-11 DIAGNOSIS — E785 Hyperlipidemia, unspecified: Secondary | ICD-10-CM | POA: Diagnosis not present

## 2017-08-11 DIAGNOSIS — I25118 Atherosclerotic heart disease of native coronary artery with other forms of angina pectoris: Secondary | ICD-10-CM | POA: Diagnosis not present

## 2017-08-11 LAB — MYOCARDIAL PERFUSION IMAGING
CHL CUP NUCLEAR SRS: 5
CSEPPHR: 114 {beats}/min
LV dias vol: 100 mL (ref 62–150)
LV sys vol: 58 mL
Rest HR: 86 {beats}/min
SDS: 4
SSS: 7
TID: 1.31

## 2017-08-11 MED ORDER — TECHNETIUM TC 99M TETROFOSMIN IV KIT
10.0000 | PACK | Freq: Once | INTRAVENOUS | Status: AC | PRN
  Administered 2017-08-11: 10 via INTRAVENOUS
  Filled 2017-08-11: qty 10

## 2017-08-11 MED ORDER — REGADENOSON 0.4 MG/5ML IV SOLN
0.4000 mg | Freq: Once | INTRAVENOUS | Status: AC
  Administered 2017-08-11: 0.4 mg via INTRAVENOUS

## 2017-08-11 MED ORDER — TECHNETIUM TC 99M TETROFOSMIN IV KIT
30.5000 | PACK | Freq: Once | INTRAVENOUS | Status: AC | PRN
  Administered 2017-08-11: 30.5 via INTRAVENOUS
  Filled 2017-08-11: qty 31

## 2017-08-13 ENCOUNTER — Telehealth: Payer: Self-pay | Admitting: Internal Medicine

## 2017-08-13 NOTE — Telephone Encounter (Signed)
Returned call to patient, aware results need to be reviewed by PA.  Aware once reviewed we will call with results.  Patient verbalized understanding.

## 2017-08-13 NOTE — Telephone Encounter (Signed)
Patient calling to see if results from Stress test are available.

## 2017-08-14 NOTE — Telephone Encounter (Signed)
Need to have the report reviewed by Dr. Rennis GoldenHilty first

## 2017-08-14 NOTE — Progress Notes (Signed)
Dr. Rennis GoldenHilty. Can you take a look? h/o prox LAD stent, recently had chest pain

## 2017-08-17 NOTE — Progress Notes (Signed)
Stress test has been reviewed with Dr. Rennis GoldenHilty, who does not appreciate significant change compare to previous study, Dr. Rennis GoldenHilty recommended medical therapy instead. Continue aspirin, metoprolol, lovastatin and lisinopril

## 2017-08-25 ENCOUNTER — Telehealth: Payer: Self-pay | Admitting: Neurology

## 2017-08-25 ENCOUNTER — Encounter: Payer: Self-pay | Admitting: Neurology

## 2017-08-25 ENCOUNTER — Ambulatory Visit (INDEPENDENT_AMBULATORY_CARE_PROVIDER_SITE_OTHER): Payer: Medicaid Other | Admitting: Neurology

## 2017-08-25 VITALS — BP 158/100 | HR 85 | Ht 65.0 in | Wt 169.0 lb

## 2017-08-25 DIAGNOSIS — G14 Postpolio syndrome: Secondary | ICD-10-CM

## 2017-08-25 DIAGNOSIS — M5416 Radiculopathy, lumbar region: Secondary | ICD-10-CM

## 2017-08-25 NOTE — Progress Notes (Signed)
PATIENT: John Mcintyre DOB: December 30, 1970  Chief Complaint  Patient presents with  . Polio    Reports Polio at 31 month old (16 in [place]).  He has right leg weakness, left leg weakness/numbness and bilateral hand numbness.  He walks with forearm crutches.  [name] PCP    [place], [place], PA-C     HISTORICAL  John Mcintyre is a 47 year old male, seen in refer by her primary care PA [place], [place] E for evaluation of polio, increased weakness, initial evaluation was on August 25, 2017.  He had a past medical history of hypertension, hyperlipidemia, longtime smoker, coronary artery disease, status post stent.  He immigrated from [place] more than 10 years ago, reported a history of polio at 39 months old, he had hypoplasia of left lower extremity, significant weakness of left leg, that is shorter than his right leg, in his prime time before age 63, he is able to ambulate without a walker, but tends to hold his left knee down with very abnormal posturing, he denies bulbar weakness, no upper extremity weakness, mild weakness of the right leg, but denies sensory loss.  Since age 4, he noticed gradual onset low back pain, radiating pain to left hip, also developed bilateral neck, shoulder achy pain rely on his crutches more, symptoms gradually getting worse, since 2018, he noticed more radiating pain to left lower extremity, sometimes left leg numbness, also noticed ejaculation malfunction  REVIEW OF SYSTEMS: Full 14 system review of systems performed and notable only for blurry vision, eye pain, chest pain, incontinence, diarrhea, joint pain, headache, numbness, weakness, diarrhea, insomnia, sleepiness  ALLERGIES: Allergies  Allergen Reactions  . Poractant Alfa     Other reaction(s): Other (See Comments) Patient does not eat pork because of his culture  . Other     Other reaction(s): Other (See Comments) Patient does not eat pork because of his culture  . Pork-Derived Products Other  (See Comments)    Patient does not eat pork because of his culture    HOME MEDICATIONS: Current Outpatient Medications  Medication Sig Dispense Refill  . aspirin EC 81 MG tablet Take 81 mg by mouth daily.    [name] lisinopril (PRINIVIL,ZESTRIL) 20 MG tablet TAKE 1 TABLET BY MOUTH EVERY DAY 30 tablet 9  . lovastatin (MEVACOR) 20 MG tablet TAKE 1 TABLET (20 MG TOTAL) BY MOUTH AT BEDTIME. 30 tablet 6  . metoprolol tartrate (LOPRESSOR) 25 MG tablet TAKE 1 TABLET BY MOUTH TWICE A DAY 180 tablet 3  . nitroGLYCERIN (NITROSTAT) 0.4 MG SL tablet Place 1 tablet (0.4 mg total) under the tongue every 5 (five) minutes as needed for chest pain. 90 tablet 3   No current facility-administered medications for this visit.     PAST MEDICAL HISTORY: Past Medical History:  Diagnosis Date  . Coronary artery disease   . Hypercholesteremia   . Hypertension   . Polio     PAST SURGICAL HISTORY: Past Surgical History:  Procedure Laterality Date  . CARDIAC CATHETERIZATION N/A 12/23/2015   Procedure: Left Heart Cath and Coronary Angiography;  Surgeon: Kathleene Hazel, MD;  Location: [facility] INVASIVE CV LAB;  Service: Cardiovascular;  Laterality: N/A;  . CARDIAC CATHETERIZATION N/A 12/23/2015   Procedure: Coronary Stent Intervention;  Surgeon: Kathleene Hazel, MD;  Location: [facility] INVASIVE CV LAB;  Service: Cardiovascular;  Laterality: N/A;  . CORONARY STENT PLACEMENT  12/23/2015   Successful PTCA/DES x 1 proximal LAD  . HIP SURGERY Left 1987   for polio,  not sure what was done  . KNEE SURGERY Left 1987   for polio    FAMILY HISTORY: Family History  Problem Relation Age of Onset  . Hypertension Mother   . Heart failure Father     SOCIAL HISTORY:  Social History   Socioeconomic History  . Marital status: Married    Spouse name: Not on file  . Number of children: 3  . Years of education: 2 years of college  . Highest education level: Not on file  Occupational History  . Occupation: Academic librarian  . Financial resource strain: Not on file  . Food insecurity:    Worry: Not on file    Inability: Not on file  . Transportation needs:    Medical: Not on file    Non-medical: Not on file  Tobacco Use  . Smoking status: Current Every Day Smoker    Packs/day: 1.00    Types: Cigarettes  . Smokeless tobacco: Never Used  . Tobacco comment: ultra lights  Substance and Sexual Activity  . Alcohol use: Yes    Comment: occ  . Drug use: No  . Sexual activity: Not on file  Lifestyle  . Physical activity:    Days per week: Not on file    Minutes per session: Not on file  . Stress: Not on file  Relationships  . Social connections:    Talks on phone: Not on file    Gets together: Not on file    Attends religious service: Not on file    Active member of club or organization: Not on file    Attends meetings of clubs or organizations: Not on file    Relationship status: Not on file  . Intimate partner violence:    Fear of current or ex partner: Not on file    Emotionally abused: Not on file    Physically abused: Not on file    Forced sexual activity: Not on file  Other Topics Concern  . Not on file  Social History Narrative   Lives at home with wife and three children.   Right-handed.   5 cups tea per day.     PHYSICAL EXAM   Vitals:   08/25/17 0838  BP: (!) 158/100  Pulse: 85  Weight: 169 lb (76.7 kg)  Height: 5\' 5"  (1.651 m)    Not recorded      Body mass index is 28.12 kg/m.  PHYSICAL EXAMNIATION:  Gen: NAD, conversant, well nourised, obese, well groomed                     Cardiovascular: Regular rate rhythm, no peripheral edema, warm, nontender. Eyes: Conjunctivae clear without exudates or hemorrhage Neck: Supple, no carotid bruits. Pulmonary: Clear to auscultation bilaterally   NEUROLOGICAL EXAM:  MENTAL STATUS: Speech:    Speech is normal; fluent and spontaneous with normal comprehension.  Cognition:     Orientation to time, place  and person     Normal recent and remote memory     Normal Attention span and concentration     Normal Language, naming, repeating,spontaneous speech     Fund of knowledge   CRANIAL NERVES: CN II: Visual fields are full to confrontation. Fundoscopic exam is normal with sharp discs and no vascular changes. Pupils are round equal and briskly reactive to light. CN III, IV, VI: extraocular movement are normal. No ptosis. CN V: Facial sensation is intact to pinprick in all 3 divisions bilaterally.  Corneal responses are intact.  CN VII: Face is symmetric with normal eye closure and smile. CN VIII: Hearing is normal to rubbing fingers CN IX, X: Palate elevates symmetrically. Phonation is normal. CN XI: Head turning and shoulder shrug are intact CN XII: Tongue is midline with normal movements and no atrophy.  MOTOR: He has significant left lower extremity hypo-plasia, upper extremities are normal, with normal muscle tone, and strength.  He has mild right lower extremity weakness . R/L  Hip flexion 4/1, knee flexion 4/2, knee extension 3/2, ankle dorsiflexion 4/3 ankle plantarflexion 5/2  REFLEXES: Reflexes are hypoactive and symmetric at the biceps, triceps, knees, and ankles. Plantar responses are flexor.  SENSORY: Intact to light touch, pinprick, positional sensation and vibratory sensation are intact in fingers and toes.  COORDINATION: Rapid alternating movements and fine finger movements are intact. There is no dysmetria on finger-to-nose and heel-knee-shin.    GAIT/STANCE: He use his crutches to walk, dragging his left leg,   DIAGNOSTIC DATA (LABS, IMAGING, TESTING) - I reviewed patient records, labs, notes, testing and imaging myself where available.   ASSESSMENT AND PLAN  Menachem Kathrine Haddockljanabi is a 47 y.o. male   History of polio developed left lower back pain, radiating pain to left lower extremity  Need to rule out left lumbar radiculopathy,  Likely a component of post polio  syndrome, increased weakness  MRI of lumbar spine, EMG nerve conduction study  Refer him to physical therapy   Levert FeinsteinYijun Jimma Ortman, M.D. Ph.D.  [facility] [address], [address] [place], [place] [address] Ph: [phone] Fax: [phone]  CC:[place], [place] E, [place]PA-C

## 2017-08-25 NOTE — Telephone Encounter (Signed)
medicaid order sent to GI the obtain the auth and will contact pt to schedule.

## 2017-09-03 ENCOUNTER — Ambulatory Visit
Admission: RE | Admit: 2017-09-03 | Discharge: 2017-09-03 | Disposition: A | Discharge status: Home / Self Care | Payer: Medicaid Other | Source: Ambulatory Visit | Attending: Neurology | Admitting: Neurology

## 2017-09-03 DIAGNOSIS — G14 Postpolio syndrome: Secondary | ICD-10-CM

## 2017-09-03 DIAGNOSIS — M5416 Radiculopathy, lumbar region: Secondary | ICD-10-CM | POA: Diagnosis not present

## 2017-09-06 ENCOUNTER — Telehealth: Payer: Self-pay | Admitting: Neurology

## 2017-09-06 NOTE — Telephone Encounter (Signed)
Please call patient, MRI of the lumbar showed no evidence of spinal stenosis or nerve root compression, however there is severe loss of paraspinal muscle mass with fatty tissue replacement adjacent to L5 and S1,  I will review MRI films with him at next follow-up visit  IMPRESSION: This MRI of the lumbar spine without contrast shows the following: 1.   There is severe loss of paraspinal muscle mass with fatty replacement adjacent to L5 and S1, with milder loss, right greater than left at higher levels.    This can be seen with neuromuscular disorders including post polio syndrome. 2.    There are no significant degenerative changes.    There is no spinal stenosis or nerve root compression. 3.    There are no acute findings.

## 2017-09-06 NOTE — Telephone Encounter (Signed)
Spoke to patient - he is aware of results and will keep his pending appt on 10/01/17 for his NCV/EMG.

## 2017-09-09 ENCOUNTER — Telehealth: Payer: Self-pay | Admitting: Internal Medicine

## 2017-09-09 NOTE — Telephone Encounter (Signed)
New Message:        [facility] Pre-operative Risk Assessment    Request for surgical clearance:  1. What type of surgery is being performed? Oral Surgery?Extractions on multiple teeth at [facility]  2. When is this surgery scheduled? TBD   3. What type of clearance is required (medical clearance vs. Pharmacy clearance to hold med vs. Both)? Both  4. Are there any medications that need to be held prior to surgery and how long? Dr is not sure  5. Practice name and name of physician performing surgery? Dr. Stefanie Libel Dr. Jodene Nam  6. What is your office phone number [phone]   7.   What is your office fax number [phone]  8.   Anesthesia type (None, local, MAC, general) ? General   John Mcintyre 09/09/2017, 10:45 AM  _________________________________________________________________   (provider comments below)

## 2017-09-09 NOTE — Telephone Encounter (Signed)
Called pt re: cardiac clearance.  He is aware that he will need an appt before he could be cleared. Pt has been scheduled to see Corine ShelterLuke Kilroy, PA-C, 09/14/17 @ 2:00. Pt thanked me for the call.

## 2017-09-09 NOTE — Telephone Encounter (Signed)
   Primary Cardiologist:Kenneth C Hilty, MD  Chart reviewed as part of pre-operative protocol coverage. Because of John Mcintyre's past medical history and time since last visit, he/she will require a follow-up visit in order to better assess preoperative cardiovascular risk.  This patient underwent a recent stress test 07/2017, reviewed by Dr. Rennis GoldenHilty. During my conversation with him, he stated that he is having chest pain now that is different that the chest pain he had prior to his stress test. His answers about his symptoms kept changing during my phone call. My recommendation is for a follow up visit to better assess his chest pain.  Pre-op covering staff: - Please schedule appointment and call patient to inform them. - Please contact requesting surgeon's office via preferred method (i.e, phone, fax) to inform them of need for appointment prior to surgery.  Roe RutherfordAngela Nicole Johny Pitstick, PA  09/09/2017, 1:45 PM

## 2017-09-14 ENCOUNTER — Ambulatory Visit: Payer: Medicaid Other | Admitting: Cardiology

## 2017-09-14 ENCOUNTER — Encounter: Payer: Self-pay | Admitting: Cardiology

## 2017-09-14 VITALS — BP 138/82 | HR 93 | Ht 65.0 in | Wt 170.4 lb

## 2017-09-14 DIAGNOSIS — I2510 Atherosclerotic heart disease of native coronary artery without angina pectoris: Secondary | ICD-10-CM | POA: Diagnosis not present

## 2017-09-14 DIAGNOSIS — Z9861 Coronary angioplasty status: Secondary | ICD-10-CM | POA: Diagnosis not present

## 2017-09-14 DIAGNOSIS — R0789 Other chest pain: Secondary | ICD-10-CM

## 2017-09-14 DIAGNOSIS — Z720 Tobacco use: Secondary | ICD-10-CM | POA: Diagnosis not present

## 2017-09-14 DIAGNOSIS — G14 Postpolio syndrome: Secondary | ICD-10-CM | POA: Diagnosis not present

## 2017-09-14 DIAGNOSIS — R911 Solitary pulmonary nodule: Secondary | ICD-10-CM | POA: Insufficient documentation

## 2017-09-14 DIAGNOSIS — Z01810 Encounter for preprocedural cardiovascular examination: Secondary | ICD-10-CM | POA: Diagnosis not present

## 2017-09-14 NOTE — Assessment & Plan Note (Signed)
Pt says he is unable to quit

## 2017-09-14 NOTE — Assessment & Plan Note (Signed)
He is scheduled to see a Neurologist about bracing options

## 2017-09-14 NOTE — Progress Notes (Signed)
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 He has a history of Polio and uses crutches. He saw Dr Rennis Golden in may 2016 for chest pain. Myoview then low risk with a small ant/apical defect. He was treated medically and did well until July 2017 when he developed exertional chest tightness, bilateral arm pain, and dyspnea. Coronary angiogram was done 12/23/15. This revealed a CTO of the RCA with L-R collaterals from the CFX, a 40% CFX, and a 95% proximal LAD-treated with a DES. HisEF was 45-50%.   He has had issues with recurrent chest pain felt to be atypical. Recently he saw Azalee Course and was scheduled for a Lexiscan. This was low risk with prior myocardial infarction with peri-infarct ischemia. This was reviewed by Dr Rennis Golden who did not feel his study was significantly changed and continued medical Rx was recommended.   He now needs dental extraction. He called for clearance but he can be a difficult historian and gave the provider a history of chest pain. He was added on to my schedule today for further evaluation. On interview the patient has several complaints. He has "buring" in his Lt arm after sitting at his computer for "7 hours". He also has numbness and tingling in his hands in the morning. He does not have chest with stairs or intercourse like he did prior to his MI. He is very concerned about his heart and asked several times if he was OK. He also was concerned because he has not been able to stop smoking. He asked if I though his lungs were "OK- no cancer?". I explained I would not be able to tell that just on physical exam. I did review his records and he had a chest CT in April 2017 that showed pulmonary nodules. Apparently a f/u scan was never done.     Current Outpatient Medications  Medication Sig Dispense Refill  . aspirin EC  81 MG tablet Take 81 mg by mouth daily.    . Cholecalciferol (VITAMIN D PO) Take by mouth.    [name] lisinopril (PRINIVIL,ZESTRIL) 20 MG tablet TAKE 1 TABLET BY MOUTH EVERY DAY 30 tablet 9  . lovastatin (MEVACOR) 20 MG tablet TAKE 1 TABLET (20 MG TOTAL) BY MOUTH AT BEDTIME. 30 tablet 6  . metoprolol tartrate (LOPRESSOR) 25 MG tablet TAKE 1 TABLET BY MOUTH TWICE A DAY 180 tablet 3  . nitroGLYCERIN (NITROSTAT) 0.4 MG SL tablet Place 1 tablet (0.4 mg total) under the tongue every 5 (five) minutes as needed for chest pain. 90 tablet 3   No current facility-administered medications for this visit.     Allergies  Allergen Reactions  . Poractant Alfa     Other reaction(s): Other (See Comments) Patient does not eat pork because of his culture  . Other     Other reaction(s): Other (See Comments) Patient does not eat pork because of his culture  . Pork-Derived Products Other (See Comments)    Patient does not eat pork because of his culture    Past Medical History:  Diagnosis Date  . Coronary artery disease   . Hypercholesteremia   . Hypertension   . Polio     Social History   Socioeconomic History  . Marital status: Married    Spouse name: Not on file  . Number of children: 3  .  Years of education: 2 years of college  . Highest education level: Not on file  Occupational History  . Occupation: Nutritional therapistTaxi Driver  Social Needs  . Financial resource strain: Not on file  . Food insecurity:    Worry: Not on file    Inability: Not on file  . Transportation needs:    Medical: Not on file    Non-medical: Not on file  Tobacco Use  . Smoking status: Current Every Day Smoker    Packs/day: 1.00    Types: Cigarettes  . Smokeless tobacco: Never Used  . Tobacco comment: ultra lights  Substance and Sexual Activity  . Alcohol use: Yes    Comment: occ  . Drug use: No  . Sexual activity: Not on file  Lifestyle  . Physical activity:    Days per week: Not on file    Minutes per session: Not on  file  . Stress: Not on file  Relationships  . Social connections:    Talks on phone: Not on file    Gets together: Not on file    Attends religious service: Not on file    Active member of club or organization: Not on file    Attends meetings of clubs or organizations: Not on file    Relationship status: Not on file  . Intimate partner violence:    Fear of current or ex partner: Not on file    Emotionally abused: Not on file    Physically abused: Not on file    Forced sexual activity: Not on file  Other Topics Concern  . Not on file  Social History Narrative   Lives at home with wife and three children.   Right-handed.   5 cups tea per day.     Family History  Problem Relation Age of Onset  . Hypertension Mother   . Heart failure Father      Review of Systems: General: negative for chills, fever, night sweats or weight changes.  Cardiovascular: negative for chest pain, dyspnea on exertion, edema, orthopnea, palpitations, paroxysmal nocturnal dyspnea or shortness of breath Dermatological: negative for rash Respiratory: negative for cough or wheezing Urologic: negative for hematuria Abdominal: negative for nausea, vomiting, diarrhea, bright red blood per rectum, melena, or hematemesis Neurologic: negative for visual changes, syncope, or dizziness All other systems reviewed and are otherwise negative except as noted above.    Blood pressure 138/82, pulse 93, height 5\' 5"  (1.651 m), weight 170 lb 6.4 oz (77.3 kg).  General appearance: alert, cooperative, no distress and poor dentition Lungs: clear to auscultation bilaterally Heart: regular rate and rhythm Skin: Skin color, texture, turgor normal. No rashes or lesions Neurologic: Grossly normal  EKG NSR-septal Qs (old)  ASSESSMENT AND PLAN:   Chest pain Atypical for angina, Myoview low risk  Pre-operative cardiovascular examination Pt is cleared for dental extraction from a cardiac standpint  CAD S/P LAD DES  12/23/15 Residual CTO RCA with collaterals and 40% CFX-EF 45-50%, plan is for medical Rx for residual CAD  Post-polio syndrome He is scheduled to see a Neurologist about bracing options  Pulmonary nodule Incidental finding on CT April 2017  Tobacco abuse Pt says he is unable to quit   PLAN  OK for dental extraction. I will forward to his PCP- ? Does he need a f/u chest CT for nodules seen in 2017 with his continued smoking.   Corine ShelterLuke Teran Daughenbaugh PA-C 09/14/2017 2:44 PM

## 2017-09-14 NOTE — Telephone Encounter (Signed)
   Patient seen by Corine ShelterLuke Kilroy, PA-C.   He is at acceptable risk for the planned procedure without further cardiac work-up.  This information has been faxed to the requesting physicians.  It has also been faxed to his PCP so that they can address the pulmonary nodules.  However, the pulmonary nodule work-up does not have to be done prior to his procedure.  Will remove from the preop pool.  Theodore DemarkRhonda Jodelle Fausto, PA-C 09/14/2017 4:44 PM Beeper [phone]

## 2017-09-14 NOTE — Assessment & Plan Note (Signed)
Atypical for angina, Myoview low risk

## 2017-09-14 NOTE — Patient Instructions (Signed)
Medication Instructions:  Your physician recommends that you continue on your current medications as directed. Please refer to the Current Medication list given to you today.  Labwork: None   Testing/Procedures: none  Follow-Up: Your physician wants you to follow-up in: 6 months with Dr Rennis GoldenHilty. You will receive a reminder letter in the mail two months in advance. If you don't receive a letter, please call our office to schedule the follow-up appointment.  Any Other Special Instructions Will Be Listed Below (If Applicable).  If you need a refill on your cardiac medications before your next appointment, please call your pharmacy.

## 2017-09-14 NOTE — Assessment & Plan Note (Signed)
Pt is cleared for dental extraction from a cardiac standpint

## 2017-09-14 NOTE — Assessment & Plan Note (Signed)
Incidental finding on CT April 2017

## 2017-09-14 NOTE — Assessment & Plan Note (Signed)
Residual CTO RCA with collaterals and 40% CFX-EF 45-50%, plan is for medical Rx for residual CAD

## 2017-10-01 ENCOUNTER — Ambulatory Visit (INDEPENDENT_AMBULATORY_CARE_PROVIDER_SITE_OTHER): Payer: Medicaid Other | Admitting: Neurology

## 2017-10-01 DIAGNOSIS — G5603 Carpal tunnel syndrome, bilateral upper limbs: Secondary | ICD-10-CM

## 2017-10-01 DIAGNOSIS — G14 Postpolio syndrome: Secondary | ICD-10-CM

## 2017-10-01 DIAGNOSIS — M5416 Radiculopathy, lumbar region: Secondary | ICD-10-CM

## 2017-10-01 MED ORDER — DULOXETINE HCL 60 MG PO CPEP
60.0000 mg | ORAL_CAPSULE | Freq: Every day | ORAL | 11 refills | Status: DC
Start: 2017-10-01 — End: 2018-01-28

## 2017-10-01 NOTE — Procedures (Signed)
Full Name: Devun Anna Gender: Male MRN #: [id number] Date of Birth: Oct 28, 1970    Visit Date: 10/01/2017 09:13 Age: 47 Years 7 Months Old Examining Physician: Levert Feinstein, MD  Referring Physician: Terrace Arabia, MD History: Left wrist sural, superficial peroneal sensory responses were normal.  Bilateral median sensory responses showed mildly prolonged peak latency with well-preserved snap amplitude.  Bilateral ulnar sensory and motor responses were normal.  Left median motor responses were normal.  Right median motor responses showed mildly prolonged distal latency with normal C map amplitude, conduction velocity.  Left peroneal to EDB, and tibial motor responses showed mild to moderately decreased the C map amplitude, with normal distal latency, conduction velocity.  Electromyography: Selective needle examination was performed at bilateral lower extremity, left upper extremity muscles, and bilateral lumbosacral, left cervical paraspinal muscles.  There is evidence of severely decreased motor unit potentials at left lower extremity muscles, with increased insertional activity, resistant with rubbery feeling upon needle insertion, large simple morphology motor unit potential, with significantly decreased recruitment patterns.  At right lower extremity muscles tested, there is evidence of normal insertional activity, enlarged complex motor unit potential with mildly decreased recruitment patterns.  There was no significant abnormality found at the needle examination of left upper extremity.  Conclusion:  This is an abnormal study.  There is electrodiagnostic evidence of chronic neuropathic changes, mainly at bilateral lower extremity, evidence of re-modification, left worse than right.  This is consistent with his history of polio.  There is no evidence of bilateral lumbosacral radiculopathy or peripheral neuropathy.  In addition, there is evidence of bilateral median neuropathy across the  wrist, consistent with bilateral carpal tunnel syndromes.  Right side is moderate, left side is mild.   ------------------------------- Levert Feinstein, M.D.   [facility] [address] [place], [place] [address] Tel: [phone] Fax: [phone]        [facility]    Nerve / Sites Muscle Latency Ref. Amplitude Ref. Rel Amp Segments Distance Velocity Ref. Area    ms ms mV mV %  cm m/s m/s mVms  L Median - APB     Wrist APB 3.8 ?4.4 4.1 ?4.0 100 Wrist - APB 7   14.0     Upper arm APB 7.9  3.8  93 Upper arm - Wrist 20 49 ?49 15.1  R Median - APB     Wrist APB 4.9 ?4.4 4.3 ?4.0 100 Wrist - APB 7   12.6     Upper arm APB 8.8  4.3  102 Upper arm - Wrist 20 51 ?49 14.5  L Ulnar - ADM     Wrist ADM 2.6 ?3.3 7.6 ?6.0 100 Wrist - ADM 7   20.1     B.Elbow ADM 6.0  6.9  90.1 B.Elbow - Wrist 18 53 ?49 21.5     A.Elbow ADM 7.9  7.1  104 A.Elbow - B.Elbow 10 53 ?49 24.1         A.Elbow - Wrist      R Ulnar - ADM     Wrist ADM 2.7 ?3.3 8.7 ?6.0 100 Wrist - ADM 7   28.2     B.Elbow ADM 5.9  8.6  98 B.Elbow - Wrist 18 56 ?49 25.0     A.Elbow ADM 7.7  7.2  83.9 A.Elbow - B.Elbow 10 55 ?49 21.5         A.Elbow - Wrist      L Peroneal - EDB  Ankle EDB 4.3 ?6.5 1.4 ?2.0 100 Ankle - EDB 9   3.1     Fib head EDB 10.3  1.2  88.8 Fib head - Ankle 26 44 ?44 2.7     Pop fossa EDB 12.6  1.1  92.3 Pop fossa - Fib head 10 44 ?44 2.5         Pop fossa - Ankle      L Tibial - AH     Ankle AH 4.3 ?5.8 0.9 ?4.0 100 Ankle - AH 9   2.2     Pop fossa AH 11.5  0.5  56.8 Pop fossa - Ankle 31 43 ?41 1.5                 SNC    Nerve / Sites Rec. Site Peak Lat Ref.  Amp Ref. Segments Distance Peak Diff Ref.    ms ms V V  cm ms ms  L Sural - Ankle (Calf)     Calf Ankle 3.5 ?4.4 20 ?6 Calf - Ankle 14    L Superficial peroneal - Ankle     Lat leg Ankle 4.1 ?4.4 6 ?6 Lat leg - Ankle 14    L Median, Ulnar - Transcarpal comparison     Median Palm Wrist 2.7 ?2.2 27 ?35 Median Palm - Wrist 8       Ulnar  Palm Wrist 2.3 ?2.2 12 ?12 Ulnar Palm - Wrist 8          Median Palm - Ulnar Palm  0.4 ?0.4  R Median, Ulnar - Transcarpal comparison     Median Palm Wrist 3.1 ?2.2 8 ?35 Median Palm - Wrist 8       Ulnar Palm Wrist 2.2 ?2.2 7 ?12 Ulnar Palm - Wrist 8          Median Palm - Ulnar Palm  0.8 ?0.4  L Median - Orthodromic (Dig II, Mid palm)     Dig II Wrist 3.6 ?3.4 12 ?10 Dig II - Wrist 13    R Median - Orthodromic (Dig II, Mid palm)     Dig II Wrist 3.9 ?3.4 8 ?10 Dig II - Wrist 13    L Ulnar - Orthodromic, (Dig V, Mid palm)     Dig V Wrist 2.9 ?3.1 11 ?5 Dig V - Wrist 11    R Ulnar - Orthodromic, (Dig V, Mid palm)     Dig V Wrist 2.8 ?3.1 9 ?5 Dig V - Wrist 68                       F  Wave    Nerve F Lat Ref.   ms ms  L Tibial - AH 46.0 ?56.0  L Ulnar - ADM 27.1 ?32.0  R Ulnar - ADM 26.9 ?32.0           EMG full       EMG Summary Table    Spontaneous MUAP Recruitment  Muscle IA Fib PSW Fasc Other Amp Dur. Poly Pattern  L. Tibialis anterior Increased None None None _______ Normal Decreased Normal Reduced  L. Tibialis posterior Increased None None None _______ Normal Decreased Normal   L. Gastrocnemius (Medial head) Increased None None None _______ Normal Normal Normal no motor unit seen  L. Vastus lateralis Increased None None None _______ Normal Decreased Normal Single  L. Biceps femoris (long head) Increased None None None _______ Normal Decreased Normal Discrete  L. Rectus femoris  Increased None None None _______ Normal Decreased Normal Reduced  L. Gluteus medius Increased None None None _______ Normal Normal Normal No Activity  L. L3 paraspinal Increased 1+ None None _______ Normal Normal Normal Normal  L. L4 paraspinal Increased None None None _______ Normal Normal Normal Normal  L. L5 paraspinal Increased None None None _______ Normal Normal Normal Normal  R. Tibialis anterior Increased None None None _______ Increased Increased 1+ Reduced  R. Gastrocnemius (Medial head)  Increased None None None _______ Increased Increased 1+ Reduced  R. Vastus lateralis Increased None None None _______ Increased Increased Normal Reduced  L. First dorsal interosseous Normal None None None _______ Normal Normal Normal Normal  L. Pronator teres Normal None None None _______ Normal Normal Normal Normal  L. Biceps brachii Normal None None None _______ Normal Normal Normal Normal  L. Deltoid Normal None None None _______ Normal Normal Normal Normal  L. Extensor digitorum communis Normal None None None _______ Normal Normal Normal Normal  L. Cervical paraspinals Normal None None None _______ Normal Normal Normal Normal

## 2017-10-01 NOTE — Progress Notes (Signed)
PATIENT: John Mcintyre DOB: 04/12/1971   HISTORICAL  John Mcintyre is a 47 year old male, seen in refer by her primary care PA [place], [place] for evaluation of polio, increased weakness, initial evaluation was on August 25, 2017.  He had a past medical history of hypertension, hyperlipidemia, longtime smoker, coronary artery disease, status post stent.  He immigrated from [place] more than 10 years ago, reported a history of polio at 49 months old, he had hypoplasia of left lower extremity, significant weakness of left leg, that is shorter than his right leg, in his prime time before age 74, he is able to ambulate without a walker, but tends to hold his left knee down with very abnormal posturing, he denies bulbar weakness, no upper extremity weakness, mild weakness of the right leg, but denies sensory loss.  Since age 83, he noticed gradual onset low back pain, radiating pain to left hip, also developed bilateral neck, shoulder achy pain rely on his crutches more, symptoms gradually getting worse, since 2018, he noticed more radiating pain to left lower extremity, sometimes left leg numbness, also noticed ejaculation malfunction  UPDATE Oct 01 2017: MRI of the lumbar spine in April 2019: There is severe loss of paraspinal muscle mass with fatty replacement adjacent to L5 and S1, with milder loss, right greater than left at higher levels.    This can be seen with neuromuscular disorders including post polio syndrome. There are no significant degenerative changes.    There is no spinal stenosis or nerve root compression.   He continue complains of diffuse body achy pain, worsening bilateral lower extremity weakness,  Electrodiagnostic study today showed evidence of chronic neuropathic changes involving bilateral lower extremity, especially right lower extremity muscles, significantly decreased motor neuron pain potentials at left lower extremity muscles, there was no significant  abnormality seen at bilateral upper extremity.  REVIEW OF SYSTEMS: Full 14 system review of systems performed and notable only for blurry vision, eye pain, chest pain, incontinence, diarrhea, joint pain, headache, numbness, weakness, diarrhea, insomnia, sleepiness  ALLERGIES: Allergies  Allergen Reactions  . Poractant Alfa     Other reaction(s): Other (See Comments) Patient does not eat pork because of his culture  . Other     Other reaction(s): Other (See Comments) Patient does not eat pork because of his culture  . Pork-Derived Products Other (See Comments)    Patient does not eat pork because of his culture    HOME MEDICATIONS: Current Outpatient Medications  Medication Sig Dispense Refill  . aspirin EC 81 MG tablet Take 81 mg by mouth daily.    . Cholecalciferol (VITAMIN D PO) Take by mouth.    [name] lisinopril (PRINIVIL,ZESTRIL) 20 MG tablet TAKE 1 TABLET BY MOUTH EVERY DAY 30 tablet 9  . lovastatin (MEVACOR) 20 MG tablet TAKE 1 TABLET (20 MG TOTAL) BY MOUTH AT BEDTIME. 30 tablet 6  . metoprolol tartrate (LOPRESSOR) 25 MG tablet TAKE 1 TABLET BY MOUTH TWICE A DAY 180 tablet 3  . nitroGLYCERIN (NITROSTAT) 0.4 MG SL tablet Place 1 tablet (0.4 mg total) under the tongue every 5 (five) minutes as needed for chest pain. 90 tablet 3   No current facility-administered medications for this visit.     PAST MEDICAL HISTORY: Past Medical History:  Diagnosis Date  . Coronary artery disease   . Hypercholesteremia   . Hypertension   . Polio     PAST SURGICAL HISTORY: Past Surgical History:  Procedure Laterality Date  . CARDIAC CATHETERIZATION  N/A 12/23/2015   Procedure: Left Heart Cath and Coronary Angiography;  Surgeon: Kathleene Hazel, MD;  Location: [facility] INVASIVE CV LAB;  Service: Cardiovascular;  Laterality: N/A;  . CARDIAC CATHETERIZATION N/A 12/23/2015   Procedure: Coronary Stent Intervention;  Surgeon: Kathleene Hazel, MD;  Location: [facility] INVASIVE CV LAB;  Service:  Cardiovascular;  Laterality: N/A;  . CORONARY STENT PLACEMENT  12/23/2015   Successful PTCA/DES x 1 proximal LAD  . HIP SURGERY Left 1987   for polio, not sure what was done  . KNEE SURGERY Left 1987   for polio    FAMILY HISTORY: Family History  Problem Relation Age of Onset  . Hypertension Mother   . Heart failure Father     SOCIAL HISTORY:  Social History   Socioeconomic History  . Marital status: Married    Spouse name: Not on file  . Number of children: 3  . Years of education: 2 years of college  . Highest education level: Not on file  Occupational History  . Occupation: Nutritional therapist  . Financial resource strain: Not on file  . Food insecurity:    Worry: Not on file    Inability: Not on file  . Transportation needs:    Medical: Not on file    Non-medical: Not on file  Tobacco Use  . Smoking status: Current Every Day Smoker    Packs/day: 1.00    Types: Cigarettes  . Smokeless tobacco: Never Used  . Tobacco comment: ultra lights  Substance and Sexual Activity  . Alcohol use: Yes    Comment: occ  . Drug use: No  . Sexual activity: Not on file  Lifestyle  . Physical activity:    Days per week: Not on file    Minutes per session: Not on file  . Stress: Not on file  Relationships  . Social connections:    Talks on phone: Not on file    Gets together: Not on file    Attends religious service: Not on file    Active member of club or organization: Not on file    Attends meetings of clubs or organizations: Not on file    Relationship status: Not on file  . Intimate partner violence:    Fear of current or ex partner: Not on file    Emotionally abused: Not on file    Physically abused: Not on file    Forced sexual activity: Not on file  Other Topics Concern  . Not on file  Social History Narrative   Lives at home with wife and three children.   Right-handed.   5 cups tea per day.     PHYSICAL EXAM   There were no vitals filed for this  visit.  Not recorded      There is no height or weight on file to calculate BMI.  PHYSICAL EXAMNIATION:  Gen: NAD, conversant, well nourised, obese, well groomed                     Cardiovascular: Regular rate rhythm, no peripheral edema, warm, nontender. Eyes: Conjunctivae clear without exudates or hemorrhage Neck: Supple, no carotid bruits. Pulmonary: Clear to auscultation bilaterally   NEUROLOGICAL EXAM:  MENTAL STATUS: Speech:    Speech is normal; fluent and spontaneous with normal comprehension.  Cognition:     Orientation to time, place and person     Normal recent and remote memory     Normal Attention span and concentration  Normal Language, naming, repeating,spontaneous speech     Fund of knowledge   CRANIAL NERVES: CN II: Visual fields are full to confrontation. Fundoscopic exam is normal with sharp discs and no vascular changes. Pupils are round equal and briskly reactive to light. CN III, IV, VI: extraocular movement are normal. No ptosis. CN V: Facial sensation is intact to pinprick in all 3 divisions bilaterally. Corneal responses are intact.  CN VII: Face is symmetric with normal eye closure and smile. CN VIII: Hearing is normal to rubbing fingers CN IX, X: Palate elevates symmetrically. Phonation is normal. CN XI: Head turning and shoulder shrug are intact CN XII: Tongue is midline with normal movements and no atrophy.  MOTOR: He has significant left lower extremity hypo-plasia, upper extremities are normal, with normal muscle tone, and strength.  He has mild right lower extremity weakness . R/L  Hip flexion 4/1, knee flexion 4/2, knee extension 3/2, ankle dorsiflexion 4/3 ankle plantarflexion 5/2  REFLEXES: Reflexes are hypoactive and symmetric at the biceps, triceps, knees, and ankles. Plantar responses are flexor.  SENSORY: Intact to light touch, pinprick, positional sensation and vibratory sensation are intact in fingers and  toes.  COORDINATION: Rapid alternating movements and fine finger movements are intact. There is no dysmetria on finger-to-nose and heel-knee-shin.    GAIT/STANCE: He use his crutches to walk, dragging his left leg,   DIAGNOSTIC DATA (LABS, IMAGING, TESTING) - I reviewed patient records, labs, notes, testing and imaging myself where available.   ASSESSMENT AND PLAN  Lonn Mcmanamon is a 47 y.o. male   History of polio developed left lower back pain, radiating pain to left lower extremity   Electrodiagnostic study today showed evidence of chronic neuropathic changes involving right lower extremity, significantly decreased number of motor unit at left lower extremity,  Correlate with MRI lumbar findings, there is evidence of severe loss of paraspinal muscles with fatty replacement adjacent to L5 and S1, right greater than left, there was no significant degenerative changes.  I refer him to physical therapy,  Add on Cymbalta 60 mg daily  Levert Feinstein, M.D. Ph.D.  [facility] [address] [place], [place] [address] Ph: [phone] Fax: [phone]  CC:[place], [place], [place]

## 2017-10-22 ENCOUNTER — Ambulatory Visit: Payer: Medicaid Other | Attending: Neurology | Admitting: Physical Therapy

## 2017-11-03 ENCOUNTER — Ambulatory Visit (HOSPITAL_COMMUNITY): Admission: RE | Admit: 2017-11-03 | Payer: Medicaid Other | Source: Ambulatory Visit | Admitting: Oral Surgery

## 2017-11-03 ENCOUNTER — Encounter (HOSPITAL_COMMUNITY): Admission: RE | Payer: Self-pay | Source: Ambulatory Visit

## 2017-11-03 SURGERY — DENTAL RESTORATION/EXTRACTION WITH X-RAY
Anesthesia: General

## 2017-11-11 ENCOUNTER — Encounter (HOSPITAL_COMMUNITY): Payer: Self-pay

## 2017-11-11 ENCOUNTER — Emergency Department (HOSPITAL_COMMUNITY)
Admission: EM | Admit: 2017-11-11 | Discharge: 2017-11-11 | Disposition: A | Discharge status: Home / Self Care | Payer: Medicaid Other | Attending: Emergency Medicine | Admitting: Emergency Medicine

## 2017-11-11 ENCOUNTER — Other Ambulatory Visit: Payer: Self-pay

## 2017-11-11 ENCOUNTER — Emergency Department (HOSPITAL_COMMUNITY): Payer: Medicaid Other

## 2017-11-11 DIAGNOSIS — I2510 Atherosclerotic heart disease of native coronary artery without angina pectoris: Secondary | ICD-10-CM | POA: Diagnosis not present

## 2017-11-11 DIAGNOSIS — R079 Chest pain, unspecified: Secondary | ICD-10-CM | POA: Diagnosis present

## 2017-11-11 DIAGNOSIS — I10 Essential (primary) hypertension: Secondary | ICD-10-CM | POA: Diagnosis not present

## 2017-11-11 DIAGNOSIS — M7918 Myalgia, other site: Secondary | ICD-10-CM | POA: Diagnosis not present

## 2017-11-11 DIAGNOSIS — Z7982 Long term (current) use of aspirin: Secondary | ICD-10-CM | POA: Insufficient documentation

## 2017-11-11 DIAGNOSIS — F17210 Nicotine dependence, cigarettes, uncomplicated: Secondary | ICD-10-CM | POA: Insufficient documentation

## 2017-11-11 DIAGNOSIS — Z79899 Other long term (current) drug therapy: Secondary | ICD-10-CM | POA: Diagnosis not present

## 2017-11-11 LAB — CBC
HEMATOCRIT: 46 % (ref 39.0–52.0)
HEMOGLOBIN: 15.2 g/dL (ref 13.0–17.0)
MCH: 29.6 pg (ref 26.0–34.0)
MCHC: 33 g/dL (ref 30.0–36.0)
MCV: 89.5 fL (ref 78.0–100.0)
Platelets: 215 10*3/uL (ref 150–400)
RBC: 5.14 MIL/uL (ref 4.22–5.81)
RDW: 13.2 % (ref 11.5–15.5)
WBC: 12.7 10*3/uL — AB (ref 4.0–10.5)

## 2017-11-11 LAB — BASIC METABOLIC PANEL
ANION GAP: 10 (ref 5–15)
BUN: 9 mg/dL (ref 6–20)
CO2: 25 mmol/L (ref 22–32)
Calcium: 9.6 mg/dL (ref 8.9–10.3)
Chloride: 105 mmol/L (ref 101–111)
Creatinine, Ser: 0.72 mg/dL (ref 0.61–1.24)
GFR calc non Af Amer: >60 mL/min (ref >60)
Glucose, Bld: 113 mg/dL — ABNORMAL HIGH (ref 65–99)
Potassium: 3.8 mmol/L (ref 3.5–5.1)
Sodium: 140 mmol/L (ref 135–145)

## 2017-11-11 LAB — I-STAT TROPONIN, ED: Troponin i, poc: 0 ng/mL (ref 0.00–0.08)

## 2017-11-11 NOTE — ED Triage Notes (Signed)
Pt reports that for the past two weeks he has been having central CP and SOB with radiation to back, denies n/v, pain is worse today

## 2017-11-11 NOTE — Discharge Instructions (Addendum)
Take Tylenol and/or ibuprofen for discomfort. Follow up with your primary care for further evaluation if pain persists.

## 2017-11-11 NOTE — ED Provider Notes (Signed)
[facility]   CSN: [id number] Arrival date & time: 11/11/17  0335     History   Chief Complaint Chief Complaint  Patient presents with  . Chest Pain    HPI John Mcintyre is a 47 y.o. male.  Patient is here for evaluation of pain that affects bilateral upper chest, radiating to back and to bilateral arms. The pain is intermittent and has been going on for 2 weeks. When he wakes in the mornings he sometimes feels his hands are tingling and painful. No swelling. He reports mild SOB but denies N, V, diaphoresis. He had a heart catheterization in the past and reports this pain is very different from pain he had prior to his heart cath. No cough or fever. 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   The history is provided by the patient. No language interpreter was used.    Past Medical History:  Diagnosis Date  . Coronary artery disease   . Hypercholesteremia   . Hypertension   . Polio     Patient Active Problem List   Diagnosis Date Noted  . Pre-operative cardiovascular examination 09/14/2017  . Pulmonary nodule 09/14/2017  . 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 Left lumbar radiculopathy 08/25/2017  . Neuropathic pain 10/15/2016  . Hypertensive urgency 04/19/2016  . CAD S/P LAD DES 12/23/15 12/23/2015  . Tobacco abuse 12/23/2015  . Essential hypertension 10/11/2014  . Hypercholesteremia 10/11/2014  . Chest pain 10/11/2014  . Polio     Past Surgical History:  Procedure Laterality Date  . CARDIAC CATHETERIZATION N/A 12/23/2015   Procedure: Left Heart Cath and Coronary Angiography;  Surgeon: Kathleene Hazelhristopher D McAlhany, MD;  [facility];  Service: Cardiovascular;  Laterality: N/A;  . CARDIAC CATHETERIZATION N/A 12/23/2015   Procedure: Coronary Stent Intervention;  Surgeon: Kathleene Hazelhristopher D McAlhany, MD;  Location: [facility] INVASIVE CV LAB;  Service: Cardiovascular;  Laterality: N/A;  .  CORONARY STENT PLACEMENT  12/23/2015   Successful PTCA/DES x 1 proximal LAD  . HIP SURGERY Left 1987   for polio, not sure what was done  . KNEE SURGERY Left 1987   for polio        Home Medications    Prior to Admission medications   Medication Sig Start Date End Date Taking? Authorizing Provider  aspirin EC 81 MG tablet Take 81 mg by mouth daily.   Yes [provider]  Cholecalciferol (VITAMIN D PO) Take 1 capsule by mouth daily.    Yes [provider]  ibuprofen (ADVIL,MOTRIN) 200 MG tablet Take 400 mg by mouth every 6 (six) hours as needed for headache or moderate pain.   Yes [provider]  lisinopril (PRINIVIL,ZESTRIL) 20 MG tablet TAKE 1 TABLET BY MOUTH EVERY DAY 12/28/16  Yes Hilty, Lisette AbuKenneth C, MD  lovastatin (MEVACOR) 20 MG tablet TAKE 1 TABLET (20 MG TOTAL) BY MOUTH AT BEDTIME. 04/26/17  Yes Hilty, Lisette AbuKenneth C, MD  metoprolol tartrate (LOPRESSOR) 25 MG tablet TAKE 1 TABLET BY MOUTH TWICE A DAY 06/29/17  Yes Hilty, Lisette AbuKenneth C, MD  nitroGLYCERIN (NITROSTAT) 0.4 MG SL tablet Place 1 tablet (0.4 mg total) under the tongue every 5 (five) minutes as needed for chest pain. 12/18/15  Yes Rosalio MacadamiaGerhardt, Lori C, NP  DULoxetine (CYMBALTA) 60 MG capsule Take 1 capsule (60 mg total) by mouth daily. Patient not taking: Reported on 10/26/2017 10/01/17   Levert FeinsteinYan, Yijun, MD    Family History Family History  Problem Relation Age of Onset  . Hypertension Mother   . Heart failure Father     Social History Social History   Tobacco Use  . Smoking status: Current Every Day Smoker    Packs/day: 1.00    Types: Cigarettes  . Smokeless tobacco: Never Used  . Tobacco comment: ultra lights  Substance Use Topics  . Alcohol use: Yes    Comment: occ  . Drug use: No     Allergies   Poractant alfa; Pork-derived products; and Other   Review of Systems Review of Systems  Constitutional: Negative for chills, diaphoresis and fever.  HENT: Negative.   Respiratory: Positive for  shortness of breath. Negative for cough.   Cardiovascular: Positive for chest pain. Negative for leg swelling.  Gastrointestinal: Negative.  Negative for abdominal pain, nausea and vomiting.  Musculoskeletal: Positive for back pain.       See HPI.  Skin: Negative.   Neurological: Negative.  Negative for weakness.       Tingling into hands without weakness.      Physical Exam Updated Vital Signs BP (!) 135/96   Pulse 78   Temp 98.5 F (36.9 C) (Oral)   Resp 18   SpO2 99%   Physical Exam  Constitutional: He appears well-developed and well-nourished.  HENT:  Head: Normocephalic.  Neck: Normal range of motion. Neck supple.  Cardiovascular: Normal rate and regular rhythm.  No murmur heard. Pulmonary/Chest: Effort normal and breath sounds normal. No tachypnea. He has no wheezes. He has no rhonchi. He has no rales.  Abdominal: Soft. Bowel sounds are normal. There is no tenderness. There is no rebound and no guarding.  Musculoskeletal: Normal range of motion.       Right lower leg: He exhibits no edema.  Marked muscle atrophy of left LE c/w history of polio.   Neurological: He is alert. No cranial nerve deficit.  Skin: Skin is warm and dry. No rash noted.  Psychiatric: He has a normal mood and affect.     ED Treatments / Results  Labs (all labs ordered are listed, but only abnormal results are displayed) Labs Reviewed  BASIC METABOLIC PANEL - Abnormal; Notable for the following components:      Result Value   Glucose, Bld 113 (*)    All other components within normal limits  CBC - Abnormal; Notable for the following components:   WBC 12.7 (*)    All other components within normal limits  I-STAT TROPONIN, ED   Results for orders placed or performed during the hospital encounter of 11/11/17  Basic metabolic panel  Result Value Ref Range   Sodium 140 135 - 145 mmol/L   Potassium 3.8 3.5 - 5.1 mmol/L   Chloride 105 101 - 111 mmol/L   CO2 25 22 - 32 mmol/L   Glucose, Bld  113 (H) 65 - 99 mg/dL   BUN 9 6 - 20 mg/dL   Creatinine, Ser 1.61 0.61 - 1.24 mg/dL   Calcium 9.6 8.9 - 09.6 mg/dL   GFR calc non Af Amer >60 >60 mL/min   GFR calc Af Amer >60 >60 mL/min   Anion gap 10 5 - 15  CBC  Result Value Ref Range   WBC 12.7 (H) 4.0 - 10.5 K/uL   RBC 5.14 4.22 - 5.81 MIL/uL   Hemoglobin 15.2 13.0 - 17.0 g/dL   HCT 04.5 40.9 - 81.1 %   MCV 89.5 78.0 - 100.0 fL   MCH 29.6 26.0 - 34.0  pg   MCHC 33.0 30.0 - 36.0 g/dL   RDW 16.1 09.6 - 04.5 %   Platelets 215 150 - 400 K/uL  I-stat troponin, ED  Result Value Ref Range   Troponin i, poc 0.00 0.00 - 0.08 ng/mL   Comment 3            EKG EKG Interpretation  Date/Time:  Thursday November 11 2017 03:41:51 EDT Ventricular Rate:  83 PR Interval:  182 QRS Duration: 80 QT Interval:  368 QTC Calculation: 432 R Axis:   53 Text Interpretation:  Normal sinus rhythm Septal infarct , age undetermined Abnormal ECG Confirmed by Geoffery Lyons ([id number]) on 11/11/2017 6:04:30 AM   Radiology Dg Chest 2 View  Result Date: 11/11/2017 CLINICAL DATA:  Chest pain and left arm pain EXAM: CHEST - 2 VIEW COMPARISON:  05/14/2017 FINDINGS: The heart size and mediastinal contours are within normal limits. Both lungs are clear. The visualized skeletal structures are unremarkable. IMPRESSION: No active cardiopulmonary disease. Electronically Signed   By: Deatra Robinson M.D.   On: 11/11/2017 04:13    Procedures Procedures (including critical care time)  Medications Ordered in ED Medications - No data to display   Initial Impression / Assessment and Plan / ED Course  I have reviewed the triage vital signs and the nursing notes.  Pertinent labs & imaging results that were available during my care of the patient were reviewed by me and considered in my medical decision making (see chart for details).     Patient presents with pain across upper chest through to back, UE's bilaterally and neck x 2 weeks. Pain is entirely different that  pain prior to his cardiac stenting.   Labs, EKG, CXR all reassuring. No neuro deficits. He can be discharged home to follow up with PCP for recheck and any further evaluation.  Final Clinical Impressions(s) / ED Diagnoses   Final diagnoses:  Nonspecific chest pain  Musculoskeletal pain    ED Discharge Orders    None       [name], PA-C 11/11/17 1914    Geoffery Lyons, MD 11/11/17 [phone]

## 2017-12-01 ENCOUNTER — Other Ambulatory Visit: Payer: Self-pay | Admitting: Internal Medicine

## 2017-12-07 ENCOUNTER — Emergency Department (HOSPITAL_COMMUNITY)
Admission: EM | Admit: 2017-12-07 | Discharge: 2017-12-07 | Disposition: A | Discharge status: Home / Self Care | Payer: Medicaid Other | Attending: Emergency Medicine | Admitting: Emergency Medicine

## 2017-12-07 ENCOUNTER — Encounter (HOSPITAL_COMMUNITY): Payer: Self-pay | Admitting: Emergency Medicine

## 2017-12-07 DIAGNOSIS — Z7982 Long term (current) use of aspirin: Secondary | ICD-10-CM | POA: Diagnosis not present

## 2017-12-07 DIAGNOSIS — Z79899 Other long term (current) drug therapy: Secondary | ICD-10-CM | POA: Diagnosis not present

## 2017-12-07 DIAGNOSIS — I10 Essential (primary) hypertension: Secondary | ICD-10-CM

## 2017-12-07 DIAGNOSIS — F17210 Nicotine dependence, cigarettes, uncomplicated: Secondary | ICD-10-CM | POA: Insufficient documentation

## 2017-12-07 DIAGNOSIS — T63441A Toxic effect of venom of bees, accidental (unintentional), initial encounter: Secondary | ICD-10-CM | POA: Diagnosis present

## 2017-12-07 DIAGNOSIS — I2510 Atherosclerotic heart disease of native coronary artery without angina pectoris: Secondary | ICD-10-CM | POA: Insufficient documentation

## 2017-12-07 MED ORDER — DIPHENHYDRAMINE HCL 25 MG PO TABS: 25.0000 mg | ORAL_TABLET | Freq: Four times a day (QID) | ORAL | 0 refills | Status: DC

## 2017-12-07 MED ORDER — ACETAMINOPHEN 500 MG PO TABS
1000.0000 mg | ORAL_TABLET | Freq: Once | ORAL | Status: AC
  Administered 2017-12-07: 1000 mg via ORAL
  Filled 2017-12-07: qty 2

## 2017-12-07 MED ORDER — FAMOTIDINE 20 MG PO TABS: 20.0000 mg | ORAL_TABLET | Freq: Two times a day (BID) | ORAL | 0 refills | Status: DC

## 2017-12-07 MED ORDER — FAMOTIDINE 20 MG PO TABS
20.0000 mg | ORAL_TABLET | Freq: Once | ORAL | Status: AC
  Administered 2017-12-07: 20 mg via ORAL
  Filled 2017-12-07: qty 1

## 2017-12-07 MED ORDER — IBUPROFEN 600 MG PO TABS: 600.0000 mg | ORAL_TABLET | Freq: Four times a day (QID) | ORAL | 0 refills | Status: DC | PRN

## 2017-12-07 MED ORDER — DIPHENHYDRAMINE HCL 25 MG PO CAPS
25.0000 mg | ORAL_CAPSULE | Freq: Once | ORAL | Status: AC
  Administered 2017-12-07: 25 mg via ORAL
  Filled 2017-12-07: qty 1

## 2017-12-07 MED ORDER — IBUPROFEN 200 MG PO TABS
600.0000 mg | ORAL_TABLET | Freq: Once | ORAL | Status: AC
  Administered 2017-12-07: 600 mg via ORAL
  Filled 2017-12-07: qty 3

## 2017-12-07 NOTE — Discharge Instructions (Signed)
Please fill the steroid prescription as prescribed by care today and take as directed, to help control symptoms please also take Pepcid twice daily and Benadryl every 6 hours for the next 3 days to help with localized reaction and discomfort.  You may take ibuprofen 600 mg every 6 hours, and Tylenol 650 mg every 6 hours to help with pain, you can also apply ice or cool compresses over the stings.  If you develop shortness of breath, facial swelling, difficulty breathing, severe rash, lightheadedness or feel that you are going to pass out or any other new or concerning symptoms return to the emergency department.  Your blood pressure was elevated today, please follow up with your primary doctor in 1 week for blood pressure recheck.

## 2017-12-07 NOTE — ED Provider Notes (Signed)
[facility]-EMERGENCY DEPT Provider Note   CSN: [id number] Arrival date & time: 12/07/17  1751     History   Chief Complaint Chief Complaint  Patient presents with  . Insect Bite  . Shortness of Breath    HPI John Mcintyre is a 47 y.o. male.  John Mcintyre is a 47 y.o. Male with a history of hypertension, hyperlipidemia, CAD and polio, who presents to the emergency department for evaluation of continued pain after bee stings.  Patient reports around 1 PM this afternoon he was stung by approximately 15 bees, reports stings to both hands, trunk, right ear and legs.  Patient reports immediately after the event he went to urgent care where he received an IM injection of Solu-Medrol and was instructed to take antihistamines.  Patient reports he has had continued pain despite the steroids, has not taken any Benadryl yet he reports this pain is making him very agitated and anxious and on his blood pressure is high.  He reports taking 400 mg dose of ibuprofen at around 2 PM has not tried anything else for his pain.  Patient was not found to be in anaphylaxis when he was seen by his PCP and currently denies any facial swelling or tongue swelling, no sensation of throat closing, no shortness of breath, no lightheadedness or dizziness aside from localized reactions from stings patient has not noticed any rash, no nausea vomiting or abdominal pain.  Patient reports history of hypertension and took his usual blood pressure medicines this morning but is concerned that it still high, he thinks this is due to pain, no chest pain, headache or vision changes.     Past Medical History:  Diagnosis Date  . Coronary artery disease   . Hypercholesteremia   . Hypertension   . Polio     Patient Active Problem List   Diagnosis Date Noted  . Pre-operative cardiovascular examination 09/14/2017  . Pulmonary nodule 09/14/2017  . 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 Left lumbar radiculopathy  08/25/2017  . Neuropathic pain 10/15/2016  . Hypertensive urgency 04/19/2016  . CAD S/P LAD DES 12/23/15 12/23/2015  . Tobacco abuse 12/23/2015  . Essential hypertension 10/11/2014  . Hypercholesteremia 10/11/2014  . Chest pain 10/11/2014  . Polio     Past Surgical History:  Procedure Laterality Date  . CARDIAC CATHETERIZATION N/A 12/23/2015   Procedure: Left Heart Cath and Coronary Angiography;  Surgeon: Kathleene Hazel, MD;  Location: [facility] INVASIVE CV LAB;  Service: Cardiovascular;  Laterality: N/A;  . CARDIAC CATHETERIZATION N/A 12/23/2015   Procedure: Coronary Stent Intervention;  Surgeon: Kathleene Hazel, MD;  Location: [facility] INVASIVE CV LAB;  Service: Cardiovascular;  Laterality: N/A;  . CORONARY STENT PLACEMENT  12/23/2015   Successful PTCA/DES x 1 proximal LAD  . HIP SURGERY Left 1987   for polio, not sure what was done  . KNEE SURGERY Left 1987   for polio        Home Medications    Prior to Admission medications   Medication Sig Start Date End Date Taking? Authorizing Provider  aspirin EC 81 MG tablet Take 81 mg by mouth daily.   Yes [provider]  Cholecalciferol (VITAMIN D PO) Take 1 capsule by mouth daily.    Yes [provider]  lisinopril (PRINIVIL,ZESTRIL) 20 MG tablet TAKE 1 TABLET BY MOUTH EVERY DAY 12/01/17  Yes Hilty, Lisette Abu, MD  lovastatin (MEVACOR) 20 MG tablet TAKE 1 TABLET (20 MG TOTAL) BY MOUTH AT BEDTIME. 04/26/17  Yes Hilty, Lisette Abu, MD  metoprolol tartrate (LOPRESSOR) 25 MG tablet TAKE 1 TABLET BY MOUTH TWICE A DAY 06/29/17  Yes Hilty, Lisette Abu, MD  diphenhydrAMINE (BENADRYL) 25 MG tablet Take 1 tablet (25 mg total) by mouth every 6 (six) hours. 12/07/17   Dartha Lodge, PA-C  DULoxetine (CYMBALTA) 60 MG capsule Take 1 capsule (60 mg total) by mouth daily. Patient not taking: Reported on 10/26/2017 10/01/17   Levert Feinstein, MD  famotidine (PEPCID) 20 MG tablet Take 1 tablet (20 mg total) by mouth 2 (two) times daily for 5 days.  12/07/17 12/12/17  Dartha Lodge, PA-C  ibuprofen (ADVIL,MOTRIN) 600 MG tablet Take 1 tablet (600 mg total) by mouth every 6 (six) hours as needed. 12/07/17   Dartha Lodge, PA-C  nitroGLYCERIN (NITROSTAT) 0.4 MG SL tablet Place 1 tablet (0.4 mg total) under the tongue every 5 (five) minutes as needed for chest pain. 12/18/15   Rosalio Macadamia, NP    Family History Family History  Problem Relation Age of Onset  . Hypertension Mother   . Heart failure Father     Social History Social History   Tobacco Use  . Smoking status: Current Every Day Smoker    Packs/day: 1.00    Types: Cigarettes  . Smokeless tobacco: Never Used  . Tobacco comment: ultra lights  Substance Use Topics  . Alcohol use: Yes    Comment: occ  . Drug use: No     Allergies   Poractant alfa; Pork-derived products; and Other   Review of Systems Review of Systems  Constitutional: Negative for chills and fever.  HENT: Negative for congestion, drooling, facial swelling, rhinorrhea and sore throat.   Eyes: Negative for visual disturbance.  Respiratory: Negative for cough and shortness of breath.   Cardiovascular: Negative for chest pain.  Gastrointestinal: Negative for abdominal pain, diarrhea, nausea and vomiting.  Genitourinary: Negative for dysuria and frequency.  Musculoskeletal: Negative for arthralgias and myalgias.  Skin: Negative for color change and rash.  Neurological: Negative for dizziness, syncope, light-headedness, numbness and headaches.     Physical Exam Updated Vital Signs BP (!) 184/116 (BP Location: Left Arm)   Pulse 78   Temp 98.3 F (36.8 C) (Oral)   Resp (!) 24   Ht 5\' 5"  (1.651 m)   Wt 75.3 kg (166 lb)   SpO2 99%   BMI 27.62 kg/m   Physical Exam  Constitutional: He appears well-developed and well-nourished.  Non-toxic appearance. He does not appear ill. No distress.  Patient appears anxious but is in no acute distress  HENT:  Head: Normocephalic and atraumatic.    Mouth/Throat: Oropharynx is clear and moist.  Eyes: Pupils are equal, round, and reactive to light. EOM are normal. Right eye exhibits no discharge. Left eye exhibits no discharge.  Neck: Neck supple.  Cardiovascular: Normal rate, regular rhythm, normal heart sounds and intact distal pulses.  Pulmonary/Chest: Effort normal and breath sounds normal. No respiratory distress.  Respirations equal and unlabored, patient able to speak in full sentences, lungs clear to auscultation bilaterally  Abdominal: Soft. Bowel sounds are normal. He exhibits no distension and no mass. There is no tenderness. There is no guarding.  Abdomen soft, nondistended, nontender to palpation in all quadrants without guarding or peritoneal signs  Musculoskeletal: He exhibits no edema or deformity.  Neurological: He is alert. Coordination normal.  Skin: Skin is warm and dry. He is not diaphoretic.  Overall scattered bee stings with small erythematous localized reactions,  the right ear has multiple bee stings with some localized swelling, no generalized rash or urticaria noted  Psychiatric: He has a normal mood and affect. His behavior is normal.  Nursing note and vitals reviewed.    ED Treatments / Results  Labs (all labs ordered are listed, but only abnormal results are displayed) Labs Reviewed - No data to display  EKG EKG Interpretation  Date/Time:  Tuesday December 07 2017 18:00:34 EDT Ventricular Rate:  82 PR Interval:    QRS Duration: 120 QT Interval:  382 QTC Calculation: 447 R Axis:   57 Text Interpretation:  Sinus rhythm LAE, consider biatrial enlargement Nonspecific intraventricular conduction delay Probable anteroseptal infarct, old Minimal ST elevation, inferior leads Artifact since last tracing no significant change Confirmed by Mancel BaleWentz, Elliott [phone]([id number]) on 12/07/2017 9:14:27 PM   Radiology No results found.  Procedures Procedures (including critical care time)  Medications Ordered in ED Medications   ibuprofen (ADVIL,MOTRIN) tablet 600 mg (600 mg Oral Given 12/07/17 2038)  acetaminophen (TYLENOL) tablet 1,000 mg (1,000 mg Oral Given 12/07/17 2039)  diphenhydrAMINE (BENADRYL) capsule 25 mg (25 mg Oral Given 12/07/17 2039)  famotidine (PEPCID) tablet 20 mg (20 mg Oral Given 12/07/17 2039)     Initial Impression / Assessment and Plan / ED Course  I have reviewed the triage vital signs and the nursing notes.  Pertinent labs & imaging results that were available during my care of the patient were reviewed by me and considered in my medical decision making (see chart for details).  Patient presents for evaluation of continued pain after getting 15 bee stings earlier this afternoon, was seen by urgent care earlier, no signs of anaphylaxis at this time was treated with steroids and encouraged to take antihistamines and anti-inflammatories.  On my evaluation.  Patient is very anxious and agitated about how long he has been waiting and how high his blood pressure is, which probably is not helping his blood pressure.  He has not had anything for pain since 2 PM.  Has clear breath sounds no evidence of facial swelling, angioedema of the tongue or lips or any sensation of throat closing, no generalized rash but several scattered localized reactions from stings.  Will treat with ibuprofen and Tylenol as well as Pepcid and Benadryl and reevaluate.  Patient reports significant improvement in his pain with these medications and he is much calmer now, his systolic blood pressure has improved somewhat, patient is not having any symptoms to suggest hypertensive urgency at this time, we will have him continue with these medications and fill his steroid prescription as prescribed by urgent care and follow-up later this week with his primary doctor for a blood pressure check.  Case discussed with Dr. Effie ShyWentz who is in agreement with this plan.  Return precautions discussed.  Patient expresses understanding and is in agreement  with plan.  Final Clinical Impressions(s) / ED Diagnoses   Final diagnoses:  Bee sting reaction, accidental or unintentional, initial encounter  Hypertension, unspecified type    ED Discharge Orders        Ordered    ibuprofen (ADVIL,MOTRIN) 600 MG tablet  Every 6 hours PRN     12/07/17 2117    famotidine (PEPCID) 20 MG tablet  2 times daily     12/07/17 2117    diphenhydrAMINE (BENADRYL) 25 MG tablet  Every 6 hours     12/07/17 2117       Dartha LodgeFord, Yul Diana N, PA-C 12/08/17 1503    Mancel BaleWentz, Elliott, MD  12/09/17 1239  

## 2017-12-07 NOTE — ED Triage Notes (Signed)
Patient reports approximately fifteen bee stings today. Seen at PCP today but reports SOB has worsened since that time. Speaking in full sentences without difficulty. Swelling noted to right ear.

## 2017-12-30 ENCOUNTER — Other Ambulatory Visit: Payer: Self-pay | Admitting: Internal Medicine

## 2018-01-20 NOTE — H&P (Signed)
HISTORY AND PHYSICAL  John Mcintyre is a 47 y.o. male patient with painful teeth. Referred by general dentist for full mouth extractions  No diagnosis found.  Past Medical History:  Diagnosis Date  . Coronary artery disease   . Hypercholesteremia   . Hypertension   . Polio     No current facility-administered medications for this encounter.    Current Outpatient Medications  Medication Sig Dispense Refill  . aspirin EC 81 MG tablet Take 81 mg by mouth daily.    . Cholecalciferol (VITAMIN D PO) Take 1 capsule by mouth daily.     . diphenhydrAMINE (BENADRYL) 25 MG tablet Take 1 tablet (25 mg total) by mouth every 6 (six) hours. 20 tablet 0  . DULoxetine (CYMBALTA) 60 MG capsule Take 1 capsule (60 mg total) by mouth daily. (Patient not taking: Reported on 10/26/2017) 30 capsule 11  . famotidine (PEPCID) 20 MG tablet Take 1 tablet (20 mg total) by mouth 2 (two) times daily for 5 days. 10 tablet 0  . ibuprofen (ADVIL,MOTRIN) 600 MG tablet Take 1 tablet (600 mg total) by mouth every 6 (six) hours as needed. 30 tablet 0  . lisinopril (PRINIVIL,ZESTRIL) 20 MG tablet TAKE 1 TABLET BY MOUTH EVERY DAY 30 tablet 4  . lovastatin (MEVACOR) 20 MG tablet TAKE 1 TABLET (20 MG TOTAL) BY MOUTH AT BEDTIME. 30 tablet 3  . metoprolol tartrate (LOPRESSOR) 25 MG tablet TAKE 1 TABLET BY MOUTH TWICE A DAY 180 tablet 3  . nitroGLYCERIN (NITROSTAT) 0.4 MG SL tablet Place 1 tablet (0.4 mg total) under the tongue every 5 (five) minutes as needed for chest pain. 90 tablet 3   Allergies  Allergen Reactions  . Poractant Alfa Other (See Comments)    Patient does not eat pork because of his culture  . Pork-Derived Products Other (See Comments)    Patient does not eat pork because of his culture  . Other Other (See Comments)    Patient does not eat pork because of his culture   Active Problems:   * No active hospital problems. *  Vitals: There were no vitals taken for this visit. Lab results:No results found  for this or any previous visit (from the past 24 hour(s)). Radiology Results: No results found. General appearance: alert, cooperative and no distress Head: Normocephalic, without obvious abnormality, atraumatic Eyes: negative Nose: Nares normal. Septum midline. Mucosa normal. No drainage or sinus tenderness. Throat: multiple carious teeth, mobile teeth, no purulence, edema, trismus. Pharynx clear Neck: no adenopathy, supple, symmetrical, trachea midline and thyroid not enlarged, symmetric, no tenderness/mass/nodules Resp: clear to auscultation bilaterally Cardio: regular rate and rhythm, S1, S2 normal, no murmur, click, rub or gallop  Assessment: Multiple nonrestorable teeth.  Plan: Full mouth extractions with alveoloplasty. GA. Day surgery.   Ocie DoyneScott Valmai Vandenberghe 01/20/2018

## 2018-01-27 ENCOUNTER — Emergency Department (HOSPITAL_COMMUNITY)
Admission: EM | Admit: 2018-01-27 | Discharge: 2018-01-27 | Disposition: A | Discharge status: Home / Self Care | Payer: Medicaid Other | Attending: Emergency Medicine | Admitting: Emergency Medicine

## 2018-01-27 ENCOUNTER — Emergency Department (HOSPITAL_COMMUNITY): Payer: Medicaid Other

## 2018-01-27 ENCOUNTER — Other Ambulatory Visit: Payer: Self-pay

## 2018-01-27 ENCOUNTER — Encounter (HOSPITAL_COMMUNITY): Payer: Self-pay | Admitting: Physician Assistant

## 2018-01-27 ENCOUNTER — Encounter (HOSPITAL_COMMUNITY): Payer: Self-pay | Admitting: Emergency Medicine

## 2018-01-27 DIAGNOSIS — F17210 Nicotine dependence, cigarettes, uncomplicated: Secondary | ICD-10-CM | POA: Diagnosis not present

## 2018-01-27 DIAGNOSIS — R072 Precordial pain: Secondary | ICD-10-CM | POA: Insufficient documentation

## 2018-01-27 DIAGNOSIS — I2510 Atherosclerotic heart disease of native coronary artery without angina pectoris: Secondary | ICD-10-CM | POA: Insufficient documentation

## 2018-01-27 DIAGNOSIS — Z7982 Long term (current) use of aspirin: Secondary | ICD-10-CM | POA: Insufficient documentation

## 2018-01-27 DIAGNOSIS — Z79899 Other long term (current) drug therapy: Secondary | ICD-10-CM | POA: Insufficient documentation

## 2018-01-27 DIAGNOSIS — R079 Chest pain, unspecified: Secondary | ICD-10-CM | POA: Diagnosis present

## 2018-01-27 DIAGNOSIS — I10 Essential (primary) hypertension: Secondary | ICD-10-CM | POA: Insufficient documentation

## 2018-01-27 LAB — CBC
HCT: 45.4 % (ref 39.0–52.0)
Hemoglobin: 14.9 g/dL (ref 13.0–17.0)
MCH: 29.8 pg (ref 26.0–34.0)
MCHC: 32.8 g/dL (ref 30.0–36.0)
MCV: 90.8 fL (ref 78.0–100.0)
PLATELETS: 224 10*3/uL (ref 150–400)
RBC: 5 MIL/uL (ref 4.22–5.81)
RDW: 13.3 % (ref 11.5–15.5)
WBC: 13.4 10*3/uL — ABNORMAL HIGH (ref 4.0–10.5)

## 2018-01-27 LAB — BASIC METABOLIC PANEL
Anion gap: 10 (ref 5–15)
BUN: <5 mg/dL — ABNORMAL LOW (ref 6–20)
CALCIUM: 9.9 mg/dL (ref 8.9–10.3)
CO2: 26 mmol/L (ref 22–32)
CREATININE: 0.69 mg/dL (ref 0.61–1.24)
Chloride: 103 mmol/L (ref 98–111)
GFR calc non Af Amer: >60 mL/min (ref >60)
Glucose, Bld: 104 mg/dL — ABNORMAL HIGH (ref 70–99)
Potassium: 3.8 mmol/L (ref 3.5–5.1)
SODIUM: 139 mmol/L (ref 135–145)

## 2018-01-27 LAB — I-STAT TROPONIN, ED
TROPONIN I, POC: 0.02 ng/mL (ref 0.00–0.08)
TROPONIN I, POC: 0.01 ng/mL (ref 0.00–0.08)

## 2018-01-27 MED ORDER — NITROGLYCERIN 2 % TD OINT
1.0000 [in_us] | TOPICAL_OINTMENT | Freq: Once | TRANSDERMAL | Status: AC
  Administered 2018-01-27: 1 [in_us] via TOPICAL
  Filled 2018-01-27: qty 1

## 2018-01-27 MED ORDER — ASPIRIN 81 MG PO CHEW
324.0000 mg | CHEWABLE_TABLET | Freq: Once | ORAL | Status: AC
  Administered 2018-01-27: 324 mg via ORAL
  Filled 2018-01-27: qty 4

## 2018-01-27 NOTE — ED Notes (Signed)
Pt discharged from ED; instructions provided; Pt encouraged to return to ED if symptoms worsen and to f/u with PCP; Pt verbalized understanding of all instructions 

## 2018-01-27 NOTE — ED Triage Notes (Signed)
Pt reports left sided chest pain X4 days.  Pt states he has appointment w/ PCP however it is hurting now.  Denies SOB, N/V/dizziness

## 2018-01-27 NOTE — ED Provider Notes (Addendum)
[facility] EMERGENCY DEPARTMENT Provider Note   CSN: [id number] Arrival date & time: 01/27/18  0204     History   Chief Complaint Chief Complaint  Patient presents with  . Chest Pain    HPI John Mcintyre is a 47 y.o. male.  HPI  This 47 year old male with history of coronary artery disease, polio, hypertension, hypercholesterolemia who presents with chest pain.  Patient reports 4-day history of waxing and waning left-sided chest pain.  He describes it as sharp.  It is not exertional in nature.  Lasts less than a minute at a time.  Nothing seems to make it better or worse.  Patient does state that at baseline he takes several nitroglycerin per week.  He is unsure whether this is helped his pain.  Currently he is pain-free.  No associated shortness of breath or diaphoresis.  Denies any leg swelling or history of blood clots.  He reports that he has a appointment with Dr. Rennis Golden on Friday.  I reviewed his chart.  He has diffuse disease with medical management recommended.  Last intervention was in 2017 with a drug-eluting stent to the LAD.  Past Medical History:  Diagnosis Date  . Coronary artery disease   . Hypercholesteremia   . Hypertension   . Polio     Patient Active Problem List   Diagnosis Date Noted  . Pre-operative cardiovascular examination 09/14/2017  . Pulmonary nodule 09/14/2017  . 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 Left lumbar radiculopathy 08/25/2017  . Neuropathic pain 10/15/2016  . Hypertensive urgency 04/19/2016  . CAD S/P LAD DES 12/23/15 12/23/2015  . Tobacco abuse 12/23/2015  . Essential hypertension 10/11/2014  . Hypercholesteremia 10/11/2014  . Chest pain 10/11/2014  . Polio     Past Surgical History:  Procedure Laterality Date  . CARDIAC CATHETERIZATION N/A 12/23/2015   Procedure: Left Heart Cath and Coronary Angiography;  Surgeon: Kathleene Hazel, MD;  Location: [facility] INVASIVE CV LAB;  Service: Cardiovascular;  Laterality:  N/A;  . CARDIAC CATHETERIZATION N/A 12/23/2015   Procedure: Coronary Stent Intervention;  Surgeon: Kathleene Hazel, MD;  Location: [facility] INVASIVE CV LAB;  Service: Cardiovascular;  Laterality: N/A;  . CORONARY STENT PLACEMENT  12/23/2015   Successful PTCA/DES x 1 proximal LAD  . HIP SURGERY Left 1987   for polio, not sure what was done  . KNEE SURGERY Left 1987   for polio        Home Medications    Prior to Admission medications   Medication Sig Start Date End Date Taking? Authorizing Provider  aspirin EC 81 MG tablet Take 81 mg by mouth daily.   Yes [provider]  lisinopril (PRINIVIL,ZESTRIL) 20 MG tablet TAKE 1 TABLET BY MOUTH EVERY DAY 12/01/17  Yes Hilty, Lisette Abu, MD  metoprolol tartrate (LOPRESSOR) 25 MG tablet TAKE 1 TABLET BY MOUTH TWICE A DAY 06/29/17  Yes Hilty, Lisette Abu, MD  nitroGLYCERIN (NITROSTAT) 0.4 MG SL tablet Place 1 tablet (0.4 mg total) under the tongue every 5 (five) minutes as needed for chest pain. 12/18/15  Yes Rosalio Macadamia, NP  diphenhydrAMINE (BENADRYL) 25 MG tablet Take 1 tablet (25 mg total) by mouth every 6 (six) hours. Patient not taking: Reported on 01/21/2018 12/07/17   Dartha Lodge, PA-C  DULoxetine (CYMBALTA) 60 MG capsule Take 1 capsule (60 mg total) by mouth daily. Patient not taking: Reported on 10/26/2017 10/01/17   Levert Feinstein, MD  famotidine (PEPCID) 20 MG tablet Take 1 tablet (20 mg  total) by mouth 2 (two) times daily for 5 days. Patient not taking: Reported on 01/21/2018 12/07/17 12/12/17  Dartha LodgeFord, Kelsey N, PA-C  ibuprofen (ADVIL,MOTRIN) 600 MG tablet Take 1 tablet (600 mg total) by mouth every 6 (six) hours as needed. Patient not taking: Reported on 01/21/2018 12/07/17   Dartha LodgeFord, Kelsey N, PA-C  lovastatin (MEVACOR) 20 MG tablet TAKE 1 TABLET (20 MG TOTAL) BY MOUTH AT BEDTIME. Patient not taking: No sig reported 12/31/17   Chrystie NoseHilty, Kenneth C, MD    Family History Family History  Problem Relation Age of Onset  . Hypertension Mother   .  Heart failure Father     Social History Social History   Tobacco Use  . Smoking status: Current Every Day Smoker    Packs/day: 1.00    Types: Cigarettes  . Smokeless tobacco: Never Used  . Tobacco comment: ultra lights  Substance Use Topics  . Alcohol use: Yes    Comment: occ  . Drug use: No     Allergies   Poractant alfa; Pork-derived products; and Other   Review of Systems Review of Systems  Constitutional: Negative for fever.  Respiratory: Negative for shortness of breath.   Cardiovascular: Positive for chest pain. Negative for leg swelling.  Gastrointestinal: Negative for abdominal pain, nausea and vomiting.  Genitourinary: Negative for dysuria.  All other systems reviewed and are negative.    Physical Exam Updated Vital Signs BP (!) 135/95 (BP Location: Right Arm)   Pulse 74   Temp 98.1 F (36.7 C) (Oral)   Resp 14   SpO2 97%   Physical Exam  Constitutional: He is oriented to person, place, and time. He appears well-developed and well-nourished.  HENT:  Head: Normocephalic and atraumatic.  Eyes: Pupils are equal, round, and reactive to light.  Neck: Neck supple.  Cardiovascular: Normal rate, regular rhythm, normal heart sounds and normal pulses.  No murmur heard. Point tenderness to palpation of the left upper chest, no crepitus  Pulmonary/Chest: Effort normal and breath sounds normal. No respiratory distress. He has no wheezes.  Abdominal: Soft. Bowel sounds are normal. There is no tenderness. There is no rebound.  Musculoskeletal: He exhibits no edema.       Right lower leg: He exhibits no tenderness and no edema.       Left lower leg: He exhibits no tenderness and no edema.  Lymphadenopathy:    He has no cervical adenopathy.  Neurological: He is alert and oriented to person, place, and time.  Skin: Skin is warm and dry.  Psychiatric: He has a normal mood and affect.  Nursing note and vitals reviewed.    ED Treatments / Results  Labs (all  labs ordered are listed, but only abnormal results are displayed) Labs Reviewed  BASIC METABOLIC PANEL - Abnormal; Notable for the following components:      Result Value   Glucose, Bld 104 (*)    BUN <5 (*)    All other components within normal limits  CBC - Abnormal; Notable for the following components:   WBC 13.4 (*)    All other components within normal limits  I-STAT TROPONIN, ED  I-STAT TROPONIN, ED    EKG EKG Interpretation  Date/Time:  Thursday January 27 2018 02:10:22 EDT Ventricular Rate:  75 PR Interval:  168 QRS Duration: 102 QT Interval:  382 QTC Calculation: 426 R Axis:   48 Text Interpretation:  Normal sinus rhythm Septal infarct , age undetermined ST & T wave abnormality, consider inferolateral ischemia  Abnormal ECG No significant change since last tracing Confirmed by Ross Marcus ([id number]) on 01/27/2018 3:52:28 AM   Radiology Dg Chest 2 View  Result Date: 01/27/2018 CLINICAL DATA:  47 y/o  M; 4 days of left-sided chest pain. EXAM: CHEST - 2 VIEW COMPARISON:  11/11/2017 chest radiograph. FINDINGS: Stable heart size and mediastinal contours are within normal limits. Both lungs are clear. The visualized skeletal structures are unremarkable. IMPRESSION: No active cardiopulmonary disease. Electronically Signed   By: Mitzi Hansen M.D.   On: 01/27/2018 02:29    Procedures Procedures (including critical care time)  Medications Ordered in ED Medications  aspirin chewable tablet 324 mg (324 mg Oral Given 01/27/18 0428)  nitroGLYCERIN (NITROGLYN) 2 % ointment 1 inch (1 inch Topical Given 01/27/18 0428)     Initial Impression / Assessment and Plan / ED Course  I have reviewed the triage vital signs and the nursing notes.  Pertinent labs & imaging results that were available during my care of the patient were reviewed by me and considered in my medical decision making (see chart for details).     Presents with 4-day history of intermittent chest pain.   Nonexertional in nature.  He is overall nontoxic-appearing and vital signs are reassuring.  He does have some reproducible pain on exam.  Patient does have a history of some diffuse disease with one prior stent placement.  His EKG not significantly changed from prior.  Initial troponin is negative.  He has remained chest pain-free while in the emergency room.  Chest x-ray shows no evidence of pneumothorax or pneumonia.  Low risk for PE.  He is PERC negative.  While he is high risk and has no disease, his story is somewhat atypical.  We will repeat his troponin.  Repeat troponin remains reassuring.  Patient has follow-up on Friday.  Feel he is safe for discharge home with close cardiology follow-up.  He was given strict return precautions including worsening pain, exertional symptoms, any new or worsening symptoms.  After history, exam, and medical workup I feel the patient has been appropriately medically screened and is safe for discharge home. Pertinent diagnoses were discussed with the patient. Patient was given return precautions.   Final Clinical Impressions(s) / ED Diagnoses   Final diagnoses:  Precordial pain    ED Discharge Orders    None       Horton, Mayer Masker, MD 01/27/18 1914    Shon Baton, MD 01/27/18 [phone]

## 2018-01-27 NOTE — Discharge Instructions (Addendum)
You were seen today for chest pain.  Your chest pain work-up is largely reassuring.  Your heart testing is negative.  You do have a history of heart disease and it is very important that you follow-up closely with your cardiologist as scheduled.  If you develop worsening pain, exertional symptoms, any new or worsening symptoms you should be reevaluated.

## 2018-01-27 NOTE — Progress Notes (Signed)
Anesthesia Chart Review: SAME DAY WORKUP   Case:  [id number] Date/Time:  01/28/18 0830   Procedure:  DENTAL RESTORATION/EXTRACTIONS (N/A )   Anesthesia type:  General   Pre-op diagnosis:  NONRESTORABLE   [facility]:  [facility] ROOM 08 / [facility]   Surgeon:  Ocie DoyneJensen, Scott, DDS      DISCUSSION: 47 yo male current smoker for above procedure. Pertinent hx includes HTN, CAD (LAD-treated with a DES in 2017), HLD, Polio (uses crutches).  He follows with cardiology for hx of CAD with DES to LAD in 2017. He was last seen 09/15/2015 by Tereso NewcomerScott Weaver, PA-C. At that time it was noted the pt has had issues with recurrent chest pain felt to be atypical. He had Lexiscan 07/2017 findings consistent with prior MI with peri-infarct ischemia, 42% EF, intermediate risk study.  Pt has been seen in the ED twice for chest pain since his last cardiovascular f/u, most recently earlier today. Review of pt chart shows he is scheduled to f/u with Dr. Rennis GoldenHilty tomorrow 8am and his surgery is scheduled tomorrow 8:30. I called Dr. Randa EvensJensen's office for clarification and was told that the patient had called to cancel the case. No timeline for reschedule, pending cardiac followup.  VS: There were no vitals taken for this visit.  PROVIDERSPiedad Climes: Fulbright, [place] E, PA-C is PCP  Zoila ShutterHilty, Kenneth, MD is Cardiologist   LABS: will need DOS labs (all labs ordered are listed, but only abnormal results are displayed)  Labs Reviewed - No data to display   IMAGES: CHEST - 2 VIEW 01/27/2018  COMPARISON:  11/11/2017 chest radiograph.  FINDINGS: Stable heart size and mediastinal contours are within normal limits. Both lungs are clear. The visualized skeletal structures are unremarkable.  IMPRESSION: No active cardiopulmonary disease.  EKG: 01/27/18: NSR. Septal infarct, age undetermined, ST & T wave abnormality, consider inferolateral ischemia  CV: Lexiscan 08/11/2017:  Nuclear stress EF: 42%. Distal apical anteroseptal  akinesis.  The left ventricular ejection fraction is moderately decreased (30-44%).  There was no ST segment deviation noted during stress.  Defect 1: There is a medium defect of moderate severity present in the mid anteroseptal and apical anterior location.  Findings consistent with prior myocardial infarction with peri-infarct ischemia. Mild increase in size of area involved when compared to prior study. Prior LAD stent.  This is an intermediate risk study.   LHC and PCI 12/23/2015:  Prox RCA lesion, 100 %stenosed.  Prox Cx lesion, 40 %stenosed.  Dist Cx lesion, 40 %stenosed.  There is mild left ventricular systolic dysfunction.  LV end diastolic pressure is normal.  The left ventricular ejection fraction is 45-50% by visual estimate.  There is no mitral valve regurgitation.  A drug eluting stent was successfully placed.  Prox LAD lesion, 95 %stenosed.  Post intervention, there is a 0% residual stenosis.   1. Double vessel CAD with unstable angina.  2. Chronic occlusion of the proximal RCA with filling of the mid and distal vessel from left to right collaterals.  3. Severe stenosis proximal LAD 4. Moderate proximal Circumflex stenosis 5. Successful PTCA/DES x 1 proximal LAD 6. Mild LV systolic dysfunction  Recommendations: Will continue ASA, Plavix for one year. Continue beta blocker and statin. Would consider changing to high dose Lipitor at discharge. Smoking cessation.   Past Medical History:  Diagnosis Date  . Coronary artery disease   . Hypercholesteremia   . Hypertension   . Polio     Past Surgical History:  Procedure Laterality Date  .  CARDIAC CATHETERIZATION N/A 12/23/2015   Procedure: Left Heart Cath and Coronary Angiography;  Surgeon: Kathleene Hazel, MD;  Location: [facility] INVASIVE CV LAB;  Service: Cardiovascular;  Laterality: N/A;  . CARDIAC CATHETERIZATION N/A 12/23/2015   Procedure: Coronary Stent Intervention;  Surgeon: Kathleene Hazel,  MD;  Location: [facility] INVASIVE CV LAB;  Service: Cardiovascular;  Laterality: N/A;  . CORONARY STENT PLACEMENT  12/23/2015   Successful PTCA/DES x 1 proximal LAD  . HIP SURGERY Left 1987   for polio, not sure what was done  . KNEE SURGERY Left 1987   for polio    MEDICATIONS: No current facility-administered medications for this encounter.    [name] aspirin EC 81 MG tablet  . lisinopril (PRINIVIL,ZESTRIL) 20 MG tablet  . lovastatin (MEVACOR) 20 MG tablet  . metoprolol tartrate (LOPRESSOR) 25 MG tablet  . nitroGLYCERIN (NITROSTAT) 0.4 MG SL tablet  . diphenhydrAMINE (BENADRYL) 25 MG tablet  . DULoxetine (CYMBALTA) 60 MG capsule  . famotidine (PEPCID) 20 MG tablet  . ibuprofen (ADVIL,MOTRIN) 600 MG tablet   [facility] Stay [facility]/Anesthesiology Phone [phone] 01/27/2018 3:21 PM

## 2018-01-28 ENCOUNTER — Encounter (HOSPITAL_COMMUNITY): Admission: RE | Payer: Self-pay | Source: Ambulatory Visit

## 2018-01-28 ENCOUNTER — Ambulatory Visit: Payer: Medicaid Other | Admitting: Internal Medicine

## 2018-01-28 ENCOUNTER — Encounter: Payer: Self-pay | Admitting: Internal Medicine

## 2018-01-28 ENCOUNTER — Ambulatory Visit (HOSPITAL_COMMUNITY): Admission: RE | Admit: 2018-01-28 | Payer: Medicaid Other | Source: Ambulatory Visit | Admitting: Oral Surgery

## 2018-01-28 VITALS — BP 134/78 | HR 84 | Ht 65.0 in | Wt 167.0 lb

## 2018-01-28 DIAGNOSIS — R0789 Other chest pain: Secondary | ICD-10-CM

## 2018-01-28 DIAGNOSIS — I2510 Atherosclerotic heart disease of native coronary artery without angina pectoris: Secondary | ICD-10-CM

## 2018-01-28 DIAGNOSIS — I10 Essential (primary) hypertension: Secondary | ICD-10-CM

## 2018-01-28 DIAGNOSIS — G14 Postpolio syndrome: Secondary | ICD-10-CM

## 2018-01-28 DIAGNOSIS — Z720 Tobacco use: Secondary | ICD-10-CM

## 2018-01-28 DIAGNOSIS — Z9861 Coronary angioplasty status: Secondary | ICD-10-CM

## 2018-01-28 SURGERY — DENTAL RESTORATION/EXTRACTIONS
Anesthesia: General

## 2018-01-28 MED ORDER — LOVASTATIN 20 MG PO TABS: ORAL_TABLET | ORAL | 3 refills | Status: DC

## 2018-01-28 NOTE — Progress Notes (Signed)
OFFICE NOTE  Chief Complaint:  Chest pain  Primary Care Physician: Elisabeth Cara, PA-C  HPI:  John Mcintyre is a pleasant 47 year old male [place] He says for almost all of his life he had polio and this affected his legs significantly causing weakness in the need to use crutches to walk. 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 Recently he's been having worsening pain in his upper chest shoulders and down both arms. Some the pain is in the back of his neck and even radiates to the right half of his face. While some of those symptoms certainly sound neurologic, he also has some associated fatigue and shortness of breath. He is a smoker and has a history of hypertension and dyslipidemia which is not been well controlled. He was recently started on lovastatin. Triglycerides were noted to be elevated greater than 700 and recently came down to the 300s. His father has a history of heart disease and sounds like he died from heart failure in his 53s. I had the pleasure seeing John Mcintyre back in the office today. He underwent a nuclear stress test which was interpreted as low risk. There was a very small distal anteroapical fixed defect which was suggestive of possible scar. EF was low normal at 52%. No reversible ischemia was seen. He reports no further significant symptoms. Subsequently he presented with worsening chest pain and was referred by Truitt Merle, NP for cardiac catheterization in 11/2015. 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 There was a CTO of the RCA with left to right collaterals and severe proximal LAD stenosis. He had successful PCI to the proximal LAD and was chest pain free thereafter.   04/20/2016  He was seen in the ER yesterday with recurrent chest pain for the past 2 days which is similar to his prior anginal symptoms. He reports compliance with his medications, but did not take it this morning. BP was  174/113 on admission. He was seen in the office in 12/2015 and his lisinopril was increased to 10 mg daily. Initial troponin was negative. Labs indicate a mildly elevated WBC count at 11.3. CXR unremarkable. EKG shows NSR without ischemic changes. He ruled out for acute MI and I increased his lisinopril to 20 mg daily. He said he try to get to the pharmacy for that prescription, but was somewhat confused that I wanted him to take twice the dose that he currently takes. This morning he took 10 mg of lisinopril blood pressure was 152/90. He denies any further chest pain. He took Aleve over-the-counter and reports improvement in his neck pain and shoulder pain. He continues to have difficulty ambulating, particularly with worsening weakness of his legs secondary to polio.  10/15/2016  John Mcintyre returns for follow-up. He was seen in the ER last night and discharged early this morning for chest pain. He recently called our office about this. He describes pain all across his chest and upper back and into his arms. This pain is completely different than the pain he had prior to his cardiac stent placement. 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 Troponin was negative. He was discharged for follow-up with [name]. He feels that his pain may be related to using crutches. 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 He may have neuropathic or musculoskeletal pain related to that. He is continuing on aspirin and Plavix which is more than adequate treatment for his coronary disease. He also complained of visual changes today including difficulty reading. I suspect this is presbyopia however at times he feels that he has some numbness on one half of his face and other unusual symptoms. He does report very poor sleep at night as well. He denies any snoring or apnea but gets less than 6  hours of sleep. He also tried Chantix for smoking cessation but said that it worsened his pain and he stopped taking it. He also felt that it made it more difficult for him to sleep.  01/22/2017  John Mcintyre returns for follow-up. He reports she's had no further chest pain. He continues to have problems with leg weakness secondary to history of polio. He has had significant muscle wasting particularly in the left hip and more difficulty walking. He is inquiring about bracing. I referred him to neurology however the wait was so long he did not take the appointment. He is more than 1 year out from stenting and I believe could be taken off of Plavix. Blood pressure is at goal today.  06/29/2017  John Mcintyre today in follow-up.  He reports some chest pain.  This is not similar to his pain with his recent stent implantation in July 2017.  He completed a year of Plavix and now is on aspirin.  Some of his symptoms seem to be worse with muscle use and he as required braces in order to support his upper body.  At his last office visit he inquired about additional bracing and I referred him to Dr. Tessa Lerner with physical medicine and rehabilitation.  Unfortunately he never made that appointment.  He is also been referred to Dr. Posey Pronto with neurology twice but canceled the appointment since they were scheduled 3 months out.  He said he either missed the appointments or did not have enough patients for them.  That being said he is asking for a referral again today.  He also says he is having trouble with his vision and I advised him to contact an ophthalmologist.  01/28/2018  John Mcintyre recently has been experiencing some chest pain.  He presented to the emergency department yesterday and had point tenderness.  He was able to point to it was in the area around the left nipple.  It is distinctly different than the pain he had previously with his prior stent which included pain across his chest and shortness of breath  with exertion.  He has had no more symptoms like that.  He took nitroglycerin without any real improvement.  He was given aspirin in the ER.  EKG was negative for ischemia.  Troponin was negative x2.  Other studies were negative and low risk for pulmonary embolus.  He has worked with rehab/physiatry for bracing, unfortunately was not able to afford any good options.  He reports he is considering edentia lesion which was recommended due to poor dentition and multiple caries.  PMHx:  Past Medical History:  Diagnosis Date  . Coronary artery disease   . Hypercholesteremia   . Hypertension   . Polio     Past Surgical History:  Procedure Laterality Date  . CARDIAC CATHETERIZATION N/A 12/23/2015   Procedure: Left Heart Cath and Coronary Angiography;  Surgeon: Burnell Blanks, MD;  Location: [facility];  Service: Cardiovascular;  Laterality: N/A;  . CARDIAC  CATHETERIZATION N/A 12/23/2015   Procedure: Coronary Stent Intervention;  Surgeon: Burnell Blanks, MD;  Location: [facility];  Service: Cardiovascular;  Laterality: N/A;  . CORONARY STENT PLACEMENT  12/23/2015   Successful PTCA/DES x 1 proximal LAD  . HIP SURGERY Left 1987   for polio, not sure what was done  . KNEE SURGERY Left 1987   for polio    FAMHx:  Family History  Problem Relation Age of Onset  . Hypertension Mother   . Heart failure Father     SOCHx:   reports that he has been smoking cigarettes. He has been smoking about 1.00 pack per day. He has never used smokeless tobacco. He reports that he drinks alcohol. He reports that he does not use drugs.  ALLERGIES:  Allergies  Allergen Reactions  . Poractant Alfa Other (See Comments)    Patient does not eat pork because of his culture  . Pork-Derived Products Other (See Comments)    Patient does not eat pork because of his culture  . Other Other (See Comments)    Patient does not eat pork because of his culture    ROS: Pertinent items noted  in HPI and remainder of comprehensive ROS otherwise negative.  HOME MEDS: Current Outpatient Medications  Medication Sig Dispense Refill  . aspirin EC 81 MG tablet Take 81 mg by mouth daily.    [name] lisinopril (PRINIVIL,ZESTRIL) 20 MG tablet TAKE 1 TABLET BY MOUTH EVERY DAY 30 tablet 4  . metoprolol tartrate (LOPRESSOR) 25 MG tablet TAKE 1 TABLET BY MOUTH TWICE A DAY 180 tablet 3  . nitroGLYCERIN (NITROSTAT) 0.4 MG SL tablet Place 1 tablet (0.4 mg total) under the tongue every 5 (five) minutes as needed for chest pain. 90 tablet 3  . lovastatin (MEVACOR) 20 MG tablet TAKE 1 TABLET (20 MG TOTAL) BY MOUTH AT BEDTIME. 90 tablet 3   No current facility-administered medications for this visit.     LABS/IMAGING: Results for orders placed or performed during the hospital encounter of 01/27/18 (from the past 48 hour(s))  Basic metabolic panel     Status: Abnormal   Collection Time: 01/27/18  2:12 AM  Result Value Ref Range   Sodium 139 135 - 145 mmol/L   Potassium 3.8 3.5 - 5.1 mmol/L   Chloride 103 98 - 111 mmol/L   CO2 26 22 - 32 mmol/L   Glucose, Bld 104 (H) 70 - 99 mg/dL   BUN <5 (L) 6 - 20 mg/dL   Creatinine, Ser 0.69 0.61 - 1.24 mg/dL   Calcium 9.9 8.9 - 10.3 mg/dL   GFR calc non Af Amer >60 >60 mL/min   GFR calc Af Amer >60 >60 mL/min    Comment: (NOTE) The eGFR has been calculated using the CKD EPI equation. This calculation has not been validated in all clinical situations. eGFR's persistently <60 mL/min signify possible Chronic Kidney Disease.    Anion gap 10 5 - 15    Comment: Performed at [facility] [address], [place] [address]  CBC     Status: Abnormal   Collection Time: 01/27/18  2:12 AM  Result Value Ref Range   WBC 13.4 (H) 4.0 - 10.5 K/uL   RBC 5.00 4.22 - 5.81 MIL/uL   Hemoglobin 14.9 13.0 - 17.0 g/dL   HCT 45.4 39.0 - 52.0 %   MCV 90.8 78.0 - 100.0 fL   MCH 29.8 26.0 - 34.0 pg   MCHC 32.8 30.0 -  36.0 g/dL   RDW 13.3 11.5 - 15.5 %    Platelets 224 150 - 400 K/uL    Comment: Performed at [facility], [place] [address], [place], [place] [address]  I-stat troponin, ED     Status: None   Collection Time: 01/27/18  2:25 AM  Result Value Ref Range   Troponin i, poc 0.02 0.00 - 0.08 ng/mL   Comment 3            Comment: Due to the release kinetics of cTnI, a negative result within the first hours of the onset of symptoms does not rule out myocardial infarction with certainty. If myocardial infarction is still suspected, repeat the test at appropriate intervals.   I-Stat Troponin, ED (not at [facility])     Status: None   Collection Time: 01/27/18  6:07 AM  Result Value Ref Range   Troponin i, poc 0.01 0.00 - 0.08 ng/mL   Comment 3            Comment: Due to the release kinetics of cTnI, a negative result within the first hours of the onset of symptoms does not rule out myocardial infarction with certainty. If myocardial infarction is still suspected, repeat the test at appropriate intervals.    Dg Chest 2 View  Result Date: 01/27/2018 CLINICAL DATA:  47 y/o  M; 4 days of left-sided chest pain. EXAM: CHEST - 2 VIEW COMPARISON:  11/11/2017 chest radiograph. FINDINGS: Stable heart size and mediastinal contours are within normal limits. Both lungs are clear. The visualized skeletal structures are unremarkable. IMPRESSION: No active cardiopulmonary disease. Electronically Signed   By: Kristine Garbe M.D.   On: 01/27/2018 02:29    WEIGHTS: Wt Readings from Last 3 Encounters:  01/28/18 167 lb (75.8 kg)  12/07/17 166 lb (75.3 kg)  09/14/17 170 lb 6.4 oz (77.3 kg)    VITALS: BP 134/78   Pulse 84   Ht '5\' 5"'$  (1.651 m)   Wt 167 lb (75.8 kg)   SpO2 98%   BMI 27.79 kg/m   EXAM: General appearance: alert and no distress Neck: no carotid bruit and no JVD Lungs: clear to auscultation bilaterally Heart: regular rate and rhythm Abdomen: soft, non-tender; bowel sounds normal; no masses,  no  organomegaly Extremities: Peripheral muscle wasting secondary to polio Pulses: 2+ and symmetric Skin: Skin color, texture, turgor normal. No rashes or lesions Neurologic: Grossly normal Psych: Pleasant  EKG: Deferred  ASSESSMENT: 1. Chest pain- atypical, sounds neuropathic in nature- recent PCI to the LAD (11/2015) and noted CTO of the RCA with left to right collaterals 2. Hypertension 3. Dyslipidemia 4. Tobacco abuse 5. 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   Mr. Baird was seen in the ER yesterday for chest pain which is atypical.  He reported point tenderness in his symptoms were much different than when he had a stent to the LAD.  Nitroglycerin did not help him significantly.  He continues to smoke.  Blood pressure was elevated although better today.  I do think there is an anxiety component with this.  I reassured him about his chest pain that I did not feel was cardiac.  I agree with his pursuit of ED edentulation as he has very poor dental caries and may actually feel better.  Follow-up with me in 6 months with APP.  Pixie Casino, MD, [facility], [place] Director of the Advanced  Lipid Disorders &  Cardiovascular Risk Reduction Clinic Diplomate of the American Board of Clinical Lipidology Attending Cardiologist  Direct Dial: [phone]  Fax: [phone]  Website:  [email or web address] 01/28/2018, 8:19 AM

## 2018-01-28 NOTE — Patient Instructions (Signed)
Your physician wants you to follow-up in: 6 months with Corine ShelterLuke Kilroy, PA.  You will receive a reminder letter in the mail two months in advance.  If you don't receive a letter, please call our office to schedule the follow-up appointment.

## 2018-03-28 ENCOUNTER — Emergency Department (HOSPITAL_COMMUNITY)
Admission: EM | Admit: 2018-03-28 | Discharge: 2018-03-29 | Disposition: A | Discharge status: Home / Self Care | Payer: Medicaid Other | Attending: Emergency Medicine | Admitting: Emergency Medicine

## 2018-03-28 ENCOUNTER — Encounter (HOSPITAL_COMMUNITY): Payer: Self-pay | Admitting: Emergency Medicine

## 2018-03-28 DIAGNOSIS — G14 Postpolio syndrome: Secondary | ICD-10-CM | POA: Insufficient documentation

## 2018-03-28 DIAGNOSIS — F17210 Nicotine dependence, cigarettes, uncomplicated: Secondary | ICD-10-CM | POA: Diagnosis not present

## 2018-03-28 DIAGNOSIS — I259 Chronic ischemic heart disease, unspecified: Secondary | ICD-10-CM | POA: Diagnosis not present

## 2018-03-28 DIAGNOSIS — Z7982 Long term (current) use of aspirin: Secondary | ICD-10-CM | POA: Diagnosis not present

## 2018-03-28 DIAGNOSIS — J0140 Acute pansinusitis, unspecified: Secondary | ICD-10-CM | POA: Insufficient documentation

## 2018-03-28 DIAGNOSIS — Z79899 Other long term (current) drug therapy: Secondary | ICD-10-CM | POA: Insufficient documentation

## 2018-03-28 DIAGNOSIS — R110 Nausea: Secondary | ICD-10-CM | POA: Diagnosis not present

## 2018-03-28 DIAGNOSIS — R42 Dizziness and giddiness: Secondary | ICD-10-CM | POA: Insufficient documentation

## 2018-03-28 DIAGNOSIS — R079 Chest pain, unspecified: Secondary | ICD-10-CM | POA: Diagnosis not present

## 2018-03-28 MED ORDER — ONDANSETRON 4 MG PO TBDP
4.0000 mg | ORAL_TABLET | Freq: Once | ORAL | Status: AC
  Administered 2018-03-28: 4 mg via ORAL
  Filled 2018-03-28: qty 1

## 2018-03-28 NOTE — ED Triage Notes (Signed)
Pt reports generalized chest pain X1 days, states he is also nausea and dizzy.  States he is "tired and lazy"  Started 7 days ago.

## 2018-03-29 ENCOUNTER — Emergency Department (HOSPITAL_COMMUNITY): Payer: Medicaid Other

## 2018-03-29 LAB — BASIC METABOLIC PANEL
ANION GAP: 7 (ref 5–15)
BUN: 6 mg/dL (ref 6–20)
CHLORIDE: 105 mmol/L (ref 98–111)
CO2: 24 mmol/L (ref 22–32)
Calcium: 9.5 mg/dL (ref 8.9–10.3)
Creatinine, Ser: 0.71 mg/dL (ref 0.61–1.24)
GFR calc Af Amer: >60 mL/min (ref >60)
GLUCOSE: 99 mg/dL (ref 70–99)
POTASSIUM: 3.8 mmol/L (ref 3.5–5.1)
SODIUM: 136 mmol/L (ref 135–145)

## 2018-03-29 LAB — CBC
HEMATOCRIT: 49.2 % (ref 39.0–52.0)
HEMOGLOBIN: 15.7 g/dL (ref 13.0–17.0)
MCH: 28.5 pg (ref 26.0–34.0)
MCHC: 31.9 g/dL (ref 30.0–36.0)
MCV: 89.3 fL (ref 80.0–100.0)
Platelets: 251 10*3/uL (ref 150–400)
RBC: 5.51 MIL/uL (ref 4.22–5.81)
RDW: 13.6 % (ref 11.5–15.5)
WBC: 13.8 10*3/uL — AB (ref 4.0–10.5)
nRBC: 0 % (ref 0.0–0.2)

## 2018-03-29 LAB — I-STAT TROPONIN, ED: TROPONIN I, POC: 0 ng/mL (ref 0.00–0.08)

## 2018-03-29 MED ORDER — AMOXICILLIN 500 MG PO CAPS
500.0000 mg | ORAL_CAPSULE | Freq: Once | ORAL | Status: AC
  Administered 2018-03-29: 500 mg via ORAL
  Filled 2018-03-29: qty 1

## 2018-03-29 MED ORDER — AMOXICILLIN 500 MG PO CAPS: 500.0000 mg | ORAL_CAPSULE | Freq: Three times a day (TID) | ORAL | 0 refills | Status: DC

## 2018-03-29 MED ORDER — HYDROCODONE-ACETAMINOPHEN 5-325 MG PO TABS
1.0000 | ORAL_TABLET | Freq: Once | ORAL | Status: AC
  Administered 2018-03-29: 1 via ORAL
  Filled 2018-03-29: qty 1

## 2018-03-29 NOTE — ED Notes (Signed)
Pt reports 3 weeks ago he was sick with the flu, took some Mucinex.  Then he started to have dizziness and pain between his neck and his waist.  Reports intermittent mid cp that radiates to his back, as well as endorses lower back pain.  Pt ambulates with a cane d/t polio.  He reports SOB at times.  He is A&O x 4 and in  NAD.

## 2018-03-29 NOTE — ED Provider Notes (Signed)
[facility] EMERGENCY DEPARTMENT Provider Note   CSN: [id number] Arrival date & time: 03/28/18  2328     History   Chief Complaint Chief Complaint  Patient presents with  . Chest Pain  . Nausea    HPI John Mcintyre is a 47 y.o. male.  The history is provided by the patient.  Chest Pain   This is a new problem. The current episode started more than 1 week ago. The problem occurs daily. The problem has not changed since onset.The pain is moderate. Associated symptoms include abdominal pain, back pain, dizziness and shortness of breath. Pertinent negatives include no fever, no headaches, no syncope and no vomiting.  His past medical history is significant for CAD.  PT presents for multiple complaints.  He reports chest pain, back pain, abdominal pain, dizziness, neck pain He told nursing staff that he felt tired and lazy.  He reports 3 weeks ago he was sick with the flu and took Mucinex.  Since then has been having some dizziness and pain in his neck.  He also reports he has pain throughout his body.  He walks with crutches due to previous history of polio.  He does have a significant history of CAD.  However, he reports that his chest pain is not similar to prior angina  Past Medical History:  Diagnosis Date  . Coronary artery disease   . Hypercholesteremia   . Hypertension   . Polio     Patient Active Problem List   Diagnosis Date Noted  . Pre-operative cardiovascular examination 09/14/2017  . Pulmonary nodule 09/14/2017  . 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 Left lumbar radiculopathy 08/25/2017  . Neuropathic pain 10/15/2016  . Hypertensive urgency 04/19/2016  . CAD S/P LAD DES 12/23/15 12/23/2015  . Tobacco abuse 12/23/2015  . Essential hypertension 10/11/2014  . Hypercholesteremia 10/11/2014  . Chest pain 10/11/2014  . Polio     Past Surgical History:  Procedure Laterality Date  . CARDIAC CATHETERIZATION N/A 12/23/2015   Procedure: Left Heart  Cath and Coronary Angiography;  Surgeon: Kathleene Hazel, MD;  Location: [facility] INVAS[facility];  Service: Cardiovascular;  Laterality: N/A;  . CARDIAC CATHETERIZATION N/A 12/23/2015   Procedure: Coronary Stent Intervention;  Surgeon: Kathleene Hazel, MD;  Location: [facility] INVASIVE [facility] LAB;  Service: Cardiovascular;  Laterality: N/A;  . CORONARY STENT PLACEMENT  12/23/2015   Successful PTCA/DES x 1 proximal LAD  . HIP SURGERY Left 1987   for polio, not sure what was done  . KNEE SURGERY Left 1987   for polio        Home Medications    Prior to Admission medications   Medication Sig Start Date End Date Taking? Authorizing Provider  aspirin EC 81 MG tablet Take 81 mg by mouth daily.    [provider]  lisinopril (PRINIVIL,ZESTRIL) 20 MG tablet TAKE 1 TABLET BY MOUTH EVERY DAY 12/01/17   Hilty, Lisette Abu, MD  lovastatin (MEVACOR) 20 MG tablet TAKE 1 TABLET (20 MG TOTAL) BY MOUTH AT BEDTIME. 01/28/18   Hilty, Lisette Abu, MD  metoprolol tartrate (LOPRESSOR) 25 MG tablet TAKE 1 TABLET BY MOUTH TWICE A DAY 06/29/17   Hilty, Lisette Abu, MD  nitroGLYCERIN (NITROSTAT) 0.4 MG SL tablet Place 1 tablet (0.4 mg total) under the tongue every 5 (five) minutes as needed for chest pain. 12/18/15   Rosalio Macadamia, NP    Family History Family History  Problem Relation Age of Onset  . Hypertension Mother   .  Heart failure Father     Social History Social History   Tobacco Use  . Smoking status: Current Every Day Smoker    Packs/day: 1.00    Types: Cigarettes  . Smokeless tobacco: Never Used  . Tobacco comment: ultra lights  Substance Use Topics  . Alcohol use: Yes    Comment: occ  . Drug use: No     Allergies   Poractant alfa; Pork-derived products; and Other   Review of Systems Review of Systems  Constitutional: Negative for fever.  Respiratory: Positive for shortness of breath.   Cardiovascular: Positive for chest pain. Negative for syncope.  Gastrointestinal: Positive  for abdominal pain. Negative for vomiting.  Musculoskeletal: Positive for back pain.  Neurological: Positive for dizziness. Negative for headaches.  All other systems reviewed and are negative.    Physical Exam Updated Vital Signs BP 133/89 (BP Location: Right Arm)   Pulse 89   Temp 98.3 F (36.8 C) (Oral)   Resp 16   Ht 1.651 m (5\' 5" )   Wt 74.8 kg   SpO2 98%   BMI 27.46 kg/m   Physical Exam CONSTITUTIONAL: Well developed/well nourished HEAD: Normocephalic/atraumatic EYES: EOMI/PERRL ENMT: Mucous membranes moist NECK: supple no meningeal signs SPINE/BACK:entire spine nontender CV: S1/S2 noted, no murmurs/rubs/gallops noted LUNGS: Lungs are clear to auscultation bilaterally, no apparent distress ABDOMEN: soft, nontender, no rebound or guarding, bowel sounds noted throughout abdomen GU:no cva tenderness NEURO: Pt is awake/alert/appropriate, no arm drift.  No facial droop.   EXTREMITIES: pulses normal/equal, contractures of legs due to polio SKIN: warm, color normal PSYCH: no abnormalities of mood noted, alert and oriented to situation   ED Treatments / Results  Labs (all labs ordered are listed, but only abnormal results are displayed) Labs Reviewed  CBC - Abnormal; Notable for the following components:      Result Value   WBC 13.8 (*)    All other components within normal limits  BASIC METABOLIC PANEL  I-STAT TROPONIN, ED    EKG EKG Interpretation  Date/Time:  Monday March 28 2018 23:32:51 EDT Ventricular Rate:  89 PR Interval:  164 QRS Duration: 72 QT Interval:  356 QTC Calculation: 433 R Axis:   65 Text Interpretation:  Normal sinus rhythm Anteroseptal infarct , age undetermined Abnormal ECG Confirmed by Zadie Rhine ([id number]) on 03/29/2018 2:38:36 AM   Radiology Dg Chest 2 View  Result Date: 03/29/2018 CLINICAL DATA:  47 year old male with cough. EXAM: CHEST - 2 VIEW COMPARISON:  Chest radiograph dated 01/27/2018 FINDINGS: The heart size and  mediastinal contours are within normal limits. Both lungs are clear. The visualized skeletal structures are unremarkable. IMPRESSION: No active cardiopulmonary disease. Electronically Signed   By: Elgie Collard M.D.   On: 03/29/2018 01:30   Ct Head Wo Contrast  Result Date: 03/29/2018 CLINICAL DATA:  47 year old male with dizziness and vertigo. EXAM: CT HEAD WITHOUT CONTRAST TECHNIQUE: Contiguous axial images were obtained from the base of the skull through the vertex without intravenous contrast. COMPARISON:  Head CT dated 05/14/2017 FINDINGS: Brain: The ventricles and sulci appropriate size for patient's age. The gray-white matter discrimination is preserved. There is no acute intracranial hemorrhage. No mass effect or midline shift. No extra-axial fluid collection. Vascular: No hyperdense vessel or unexpected calcification. Skull: Normal. Negative for fracture or focal lesion. Sinuses/Orbits: There is partial opacification of bilateral maxillary sinuses with air-fluid level in the right maxillary sinus. Bilateral mastoid effusions, left greater right. Other: None IMPRESSION: 1. Unremarkable noncontrast CT of the brain.  2. Paranasal sinus disease and bilateral mastoid effusions. Electronically Signed   By: Elgie Collard M.D.   On: 03/29/2018 03:59    Procedures Procedures   Medications Ordered in ED Medications  ondansetron (ZOFRAN-ODT) disintegrating tablet 4 mg (4 mg Oral Given 03/28/18 2345)  HYDROcodone-acetaminophen (NORCO/VICODIN) 5-325 MG per tablet 1 tablet (1 tablet Oral Given 03/29/18 0257)  amoxicillin (AMOXIL) capsule 500 mg (500 mg Oral Given 03/29/18 0435)     Initial Impression / Assessment and Plan / ED Course  I have reviewed the triage vital signs and the nursing notes.  Pertinent labs & imaging results that were available during my care of the patient were reviewed by me and considered in my medical decision making (see chart for details).     3:23 AM Pt presents  multiple complaints.  For chest pain, he had ongoing chest pain for a week with a negative troponin and no acute EKG changes.  Even he reports it is not similar to prior angina.  Will defer further work-up for chest pain.  He also has multiple complaints as well as back pain and abdominal pain is  requesting CT of his whole body.  He also endorses neck pain and dizziness.  At this point, I feel his dizziness is his main issue, will proceed with CT head.  He is otherwise well-appearing. 4:38 AM Resting comfortably in no acute distress. CT head shows no acute neurologic issue, but does reveal sinusitis.  This could explain his facial pain and intermittent dizziness.  Otherwise his neuro exam is unremarkable.  He also has mastoid effusions.  We will start him on antibiotics for 10 days.  I have advised that if there is no improvement in 10 days he should follow-up with his PCP. Final Clinical Impressions(s) / ED Diagnoses   Final diagnoses:  Chest pain, unspecified type  Acute non-recurrent pansinusitis    ED Discharge Orders         Ordered    amoxicillin (AMOXIL) 500 MG capsule  3 times daily     03/29/18 0428           Zadie Rhine, MD 03/29/18 [phone]

## 2018-03-29 NOTE — ED Notes (Signed)
Patient transported to CT scan . 

## 2018-04-16 ENCOUNTER — Other Ambulatory Visit: Payer: Self-pay | Admitting: Internal Medicine

## 2018-04-26 ENCOUNTER — Ambulatory Visit: Payer: Medicaid Other | Admitting: Internal Medicine

## 2018-05-11 ENCOUNTER — Telehealth: Payer: Self-pay | Admitting: Internal Medicine

## 2018-05-11 NOTE — Telephone Encounter (Signed)
Pt called to report that he has been having increased dizziness.. He spoke with the pharmacist and he advised him that he sounded like he had vertigo and gave him OTC med which he just picking up now.. He denies chest pain but says that he feels a little sob.[name]. He says his symptoms are not any worse than they have been over the past few months. I asked pt if he has a PMD and he does but I could not understand what the name was based on his heavy accent. Pt has an appt Monday with Joni Reiningkathryn Lawrence NP made by our schedulers.. Will go to the ER if his symptoms worsen prior to that appt.

## 2018-05-11 NOTE — Telephone Encounter (Signed)
° ° °  Patient calling with concerns about dizziness and chest discomfort for 2 days. BP 149/99, 130/90

## 2018-05-15 NOTE — Progress Notes (Signed)
Cardiology Office Note   Date:  05/15/2018   ID:  John Mcintyre, DOB 1971/03/28, MRN [id number]  PCP:  Burnis Medin, PA-C  Cardiologist: Dr. Rennis Golden No chief complaint on file.    History of Present Illness: John Mcintyre is a 47 y.o. male who presents for ongoing assessment and management of coronary artery disease, with cardiac catheterization on 12/23/2015 which revealed CTO of the RCA with left to right collaterals from the circumflex, a 40% circumflex, and a 95% proximal left anterior descending artery treated with a drug-eluting stent.  EF at that time was calculated at 45% to 50%.  The patient was last seen by Corine Shelter, PA on 09/14/2017.  At that time he did have some complaints of recurrent chest pain which was felt to be atypical.  A follow-up Lexiscan Myoview was ordered which was low risk with prior myocardial infarction and mild peri-infarct ischemia.  The study was not significantly changed and he was continued on medical management.  He was also seen for preoperative evaluation for dental extraction.  The patient was cleared from a cardiac standpoint to proceed with dental extraction.  He was continued on medical management for CAD.  He unfortunately continues to smoke and was counseled on smoking cessation.  He called our office on 05/11/2018 for complaints of dizziness and chest discomfort and was advised by Dr. Rennis Golden to be seen today. He has multiple complaints today. Chronic sinusitis, headaches, chronic back pain, DOE, ED, tobacco abuse, concerns that he may have cancer, and fatigue. He requests treatment for all of this.   Past Medical History:  Diagnosis Date  . Coronary artery disease   . Hypercholesteremia   . Hypertension   . Polio     Past Surgical History:  Procedure Laterality Date  . CARDIAC CATHETERIZATION N/A 12/23/2015   Procedure: Left Heart Cath and Coronary Angiography;  Surgeon: Kathleene Hazel, MD;  Location: [facility] INVASIVE CV LAB;   Service: Cardiovascular;  Laterality: N/A;  . CARDIAC CATHETERIZATION N/A 12/23/2015   Procedure: Coronary Stent Intervention;  Surgeon: Kathleene Hazel, MD;  Location: [facility] INVASIVE CV LAB;  Service: Cardiovascular;  Laterality: N/A;  . CORONARY STENT PLACEMENT  12/23/2015   Successful PTCA/DES x 1 proximal LAD  . HIP SURGERY Left 1987   for polio, not sure what was done  . KNEE SURGERY Left 1987   for polio     Current Outpatient Medications  Medication Sig Dispense Refill  . acetaminophen (TYLENOL) 325 MG tablet Take 325-650 mg by mouth every 6 (six) hours as needed (for pain or headaches).    [name] amoxicillin (AMOXIL) 500 MG capsule Take 1 capsule (500 mg total) by mouth 3 (three) times daily. 30 capsule 0  . aspirin EC 81 MG tablet Take 81 mg by mouth daily.    [name] ibuprofen (ADVIL,MOTRIN) 200 MG tablet Take 200-400 mg by mouth every 6 (six) hours as needed for headache or mild pain.    [name] lisinopril (PRINIVIL,ZESTRIL) 20 MG tablet TAKE 1 TABLET BY MOUTH EVERY DAY 30 tablet 3  . lovastatin (MEVACOR) 20 MG tablet TAKE 1 TABLET (20 MG TOTAL) BY MOUTH AT BEDTIME. (Patient taking differently: Take 20 mg by mouth at bedtime. ) 90 tablet 3  . metoprolol tartrate (LOPRESSOR) 25 MG tablet TAKE 1 TABLET BY MOUTH TWICE A DAY (Patient taking differently: Take 25 mg by mouth 2 (two) times daily. ) 180 tablet 3  . nitroGLYCERIN (NITROSTAT) 0.4 MG SL tablet Place 1 tablet (0.4 mg  total) under the tongue every 5 (five) minutes as needed for chest pain. 90 tablet 3   No current facility-administered medications for this visit.     Allergies:   Poractant alfa; Pork-derived products; and Tape    Social History:  The patient  reports that he has been smoking cigarettes. He has been smoking about 1.00 pack per day. He has never used smokeless tobacco. He reports current alcohol use. He reports that he does not use drugs.   Family History:  The patient's family history includes Heart failure in his  father; Hypertension in his mother.    ROS: All other systems are reviewed and negative. Unless otherwise mentioned in H&P    PHYSICAL EXAM: VS:  There were no vitals taken for this visit. , BMI There is no height or weight on file to calculate BMI. GEN: Well nourished, well developed, in no acute distress HEENT: normal Neck: no JVD, carotid bruits, or masses Cardiac: RRR, tachycardic ; no murmurs, rubs, or gallops,no edema  Respiratory:  Clear to auscultation bilaterally, normal work of breathing GI: soft, nontender, nondistended, + BS MS: no deformity or atrophy Skin: warm and dry, no rash Neuro:  Strength and sensation are intact Psych: euthymic mood, full affect   EKG:  Sinus tachycardia rate of 109 bpm.   Recent Labs: 03/28/2018: BUN 6; Creatinine, Ser 0.71; Hemoglobin 15.7; Platelets 251; Potassium 3.8; Sodium 136    Lipid Panel    Component Value Date/Time   CHOL 158 12/18/2015 1016   TRIG 302 (H) 12/18/2015 1016   HDL 25 (L) 12/18/2015 1016   CHOLHDL 6.3 (H) 12/18/2015 1016   VLDL 60 (H) 12/18/2015 1016   LDLCALC 73 12/18/2015 1016      Wt Readings from Last 3 Encounters:  03/28/18 165 lb (74.8 kg)  01/28/18 167 lb (75.8 kg)  12/07/17 166 lb (75.3 kg)      Other studies Reviewed: NM Stress test Sep 02, 2017  Nuclear stress EF: 42%. Distal apical anteroseptal akinesis.  The left ventricular ejection fraction is moderately decreased (30-44%).  There was no ST segment deviation noted during stress.  Defect 1: There is a medium defect of moderate severity present in the mid anteroseptal and apical anterior location.  Findings consistent with prior myocardial infarction with peri-infarct ischemia. Mild increase in size of area involved when compared to prior study. Prior LAD stent.  This is an intermediate risk study.     ASSESSMENT AND PLAN:  1.  Chest pressure: He has been ruled out for progressive CAD by recent stress test. I believe this to be more  respiratory in etiology. He should follow up with PCP for possible need for pulmonology evaluation vs inhalers.   2. CAD: CTO of the RCA with L to R collaterals, 40% circumflex and LAD stent as a result of 95% stenosis.  If symptoms persist with increased BB may need to add isosorbide. Most recent stress test in March of 2019 negative for ischemia.   3. Tachycardia; He smoke heavily and drinks several cups of hot tea a day. I have advised him to try decaffeinated tea to assist with reducing his HR. I will increase metoprolol to 50 mg in the am and keep him at 25 mg in the pm.   4. Tobacco Abuse: I have spoken to him about Chantix, he did not tolerate this, he did not like the nicotine patches or gum. He may have to try weaning from cigarettes vs stopping cold [place]. He does not  seem motivated to stop.   5. ED: He is to see PCP for this. BB can certainly contribute to ED. He has had Cialis in the past with good success.   6.. Chronic back pain: He is to find chiropractor on his own or via PCP.    Current medicines are reviewed at length with the patient today.    Labs/ tests ordered today include:None   Bettey MareKathryn M. Liborio NixonLawrence DNP, ANP, AACC   05/15/2018 4:37 PM    [facility] [address] Office [phone] Fax [phone]

## 2018-05-16 ENCOUNTER — Encounter: Payer: Self-pay | Admitting: Adult Health

## 2018-05-16 ENCOUNTER — Ambulatory Visit (INDEPENDENT_AMBULATORY_CARE_PROVIDER_SITE_OTHER): Payer: Medicaid Other | Admitting: Adult Health

## 2018-05-16 VITALS — BP 130/88 | HR 109 | Ht 65.0 in | Wt 169.0 lb

## 2018-05-16 DIAGNOSIS — R000 Tachycardia, unspecified: Secondary | ICD-10-CM

## 2018-05-16 DIAGNOSIS — R0789 Other chest pain: Secondary | ICD-10-CM

## 2018-05-16 DIAGNOSIS — I2510 Atherosclerotic heart disease of native coronary artery without angina pectoris: Secondary | ICD-10-CM | POA: Diagnosis not present

## 2018-05-16 DIAGNOSIS — Z720 Tobacco use: Secondary | ICD-10-CM

## 2018-05-16 DIAGNOSIS — Z9861 Coronary angioplasty status: Secondary | ICD-10-CM

## 2018-05-16 MED ORDER — METOPROLOL TARTRATE 50 MG PO TABS: 50.0000 mg | ORAL_TABLET | Freq: Two times a day (BID) | ORAL | 6 refills | Status: DC

## 2018-05-16 NOTE — Patient Instructions (Signed)
Medication Instructions:  INCREASE METOPROLOL 50MG (WHOLE TAB) IN THE AM AND 25MG  (1/2 TAB) IN THE PM If you need a refill on your cardiac medications before your next appointment, please call your pharmacy.  Labwork: If you have labs (blood work) drawn today and your tests are completely normal, you will receive your results only by MyChart Message (if you have MyChart) -OR- A paper copy in the mail.  If you have any lab test that is abnormal or we need to change your treatment, we will call you to review these results.  Special Instructions: STOP SMOKING  MAKE SURE TO DRINK ONLY DE-CAFFINATED TEA  MAKE SURE YOU CALL PCP WITH YOUR NON-CARDIAC ISSUES  Follow-Up: You will need a follow up appointment in 1 months.  You may see Chrystie NoseKenneth C Hilty, MD , Joni ReiningKathryn Lawrence, DNP, AACC  or one of the following Advanced Practice Providers on your designated Care Team: Azalee CourseHao Meng, [place]PA-C . Micah FlesherAngela Duke, PA-C   At [facility], you and your health needs are our priority.  As part of our continuing mission to provide you with exceptional heart care, we have created designated Provider Care Teams.  These Care Teams include your primary Cardiologist (physician) and Advanced Practice Providers (APPs -  Physician Assistants and Nurse Practitioners) who all work together to provide you with the care you need, when you need it.    Steps to Quit Smoking Smoking tobacco can be bad for your health. It can also affect almost every organ in your body. Smoking puts you and people around you at risk for many serious long-lasting (chronic) diseases. Quitting smoking is hard, but it is one of the best things that you can do for your health. It is never too late to quit. What are the benefits of quitting smoking? When you quit smoking, you lower your risk for getting serious diseases and conditions. They can include:  Lung cancer or lung disease.  Heart disease.  Stroke.  Heart attack.  Not being able to have  children (infertility).  Weak bones (osteoporosis) and broken bones (fractures).  If you have coughing, wheezing, and shortness of breath, those symptoms may get better when you quit. You may also get sick less often. If you are pregnant, quitting smoking can help to lower your chances of having a baby of low birth weight. What can I do to help me quit smoking? Talk with your doctor about what can help you quit smoking. Some things you can do (strategies) include:  Quitting smoking totally, instead of slowly cutting back how much you smoke over a period of time.  Going to in-person counseling. You are more likely to quit if you go to many counseling sessions.  Using resources and support systems, such as: ? Agricultural engineernline chats with a Veterinary surgeoncounselor. ? Phone quitlines. ? Automotive engineerrinted self-help materials. ? Support groups or group counseling. ? Text messaging programs. ? Mobile phone apps or applications.  Taking medicines. Some of these medicines may have nicotine in them. If you are pregnant or breastfeeding, do not take any medicines to quit smoking unless your doctor says it is okay. Talk with your doctor about counseling or other things that can help you.  Talk with your doctor about using more than one strategy at the same time, such as taking medicines while you are also going to in-person counseling. This can help make quitting easier. What things can I do to make it easier to quit? Quitting smoking might feel very hard at first, but  there is a lot that you can do to make it easier. Take these steps:  Talk to your family and friends. Ask them to support and encourage you.  Call phone quitlines, reach out to support groups, or work with a Veterinary surgeon.  Ask people who smoke to not smoke around you.  Avoid places that make you want (trigger) to smoke, such as: ? Bars. ? Parties. ? Smoke-break areas at work.  Spend time with people who do not smoke.  Lower the stress in your life. Stress can  make you want to smoke. Try these things to help your stress: ? Getting regular exercise. ? Deep-breathing exercises. ? Yoga. ? Meditating. ? Doing a body scan. To do this, close your eyes, focus on one area of your body at a time from head to toe, and notice which parts of your body are tense. Try to relax the muscles in those areas.  Download or buy apps on your mobile phone or tablet that can help you stick to your quit plan. There are many free apps, such as QuitGuide from the [employer] developer for Disease Control and Prevention). You can find more support from smokefree.gov and other websites.  This information is not intended to replace advice given to you by your health care provider. Make sure you discuss any questions you have with your health care provider. Document Released: 03/14/2009 Document Revised: 01/14/2016 Document Reviewed: 10/02/2014 Elsevier Interactive Patient Education  2018 [employer].   Smoking Tobacco Information Smoking tobacco will very likely harm your health. Tobacco contains a poisonous (toxic), colorless chemical called nicotine. Nicotine affects the brain and makes tobacco addictive. This change in your brain can make it hard to stop smoking. Tobacco also has other toxic chemicals that can hurt your body and raise your risk of many cancers. How can smoking tobacco affect me? Smoking tobacco can increase your chances of having serious health conditions, such as:  Cancer. Smoking is most commonly associated with lung cancer, but can lead to cancer in other parts of the body.  Chronic obstructive pulmonary disease (COPD). This is a long-term lung condition that makes it hard to breathe. It also gets worse over time.  High blood pressure (hypertension), heart disease, stroke, or heart attack.  Lung infections, such as pneumonia.  Cataracts. This is when the lenses in the eyes become clouded.  Digestive problems. This may include peptic ulcers, heartburn, and  gastroesophageal reflux disease (GERD).  Oral health problems, such as gum disease and tooth loss.  Loss of taste and smell.  Smoking can affect your appearance by causing:  Wrinkles.  Yellow or stained teeth, fingers, and fingernails.  Smoking tobacco can also affect your social life.  Many workplaces, [employer], hotels, and public places are tobacco-free. This means that you may experience challenges in finding places to smoke when away from home.  The cost of a smoking habit can be expensive. Expenses for someone who smokes come in two ways: ? You spend money on a regular basis to buy tobacco. ? Your health care costs in the long-term are higher if you smoke.  Tobacco smoke can also affect the health of those around you. Children of smokers have greater chances of: ? Sudden infant death syndrome (SIDS). ? Ear infections. ? Lung infections.  What lifestyle changes can be made?  Do not start smoking. Quit if you already do.  To quit smoking: ? Make a plan to quit smoking and commit yourself to it. Look for programs  to help you and ask your health care provider for recommendations and ideas. ? Talk with your health care provider about using nicotine replacement medicines to help you quit. Medicine replacement medicines include gum, lozenges, patches, sprays, or pills. ? Do not replace cigarette smoking with electronic cigarettes, which are commonly called e-cigarettes. The safety of e-cigarettes is not known, and some may contain harmful chemicals. ? Avoid places, people, or situations that tempt you to smoke. ? If you try to quit but return to smoking, don't give up hope. It is very common for people to try a number of times before they fully succeed. When you feel ready again, give it another try.  Quitting smoking might affect the way you eat as well as your weight. Be prepared to monitor your eating habits. Get support in planning and following a healthy diet.  Ask your  health care provider about having regular tests (screenings) to check for cancer. This may include blood tests, imaging tests, and other tests.  Exercise regularly. Consider taking walks, joining a gym, or doing yoga or exercise classes.  Develop skills to manage your stress. These skills include meditation. What are the benefits of quitting smoking? By quitting smoking, you may:  Lower your risk of getting cancer and other diseases caused by smoking.  Live longer.  Breathe better.  Lower your blood pressure and heart rate.  Stop your addiction to tobacco.  Stop creating secondhand smoke that hurts other people.  Improve your sense of taste and smell.  Look better over time, due to having fewer wrinkles and less staining.  What can happen if changes are not made? If you do not stop smoking, you may:  Get cancer and other diseases.  Develop COPD or other long-term (chronic) lung conditions.  Develop serious problems with your heart and blood vessels (cardiovascular system).  Need more tests to screen for problems caused by smoking.  Have higher, long-term healthcare costs from medicines or treatments related to smoking.  Continue to have worsening changes in your lungs, mouth, and nose.  Where to find support: To get support to quit smoking, consider:  Asking your health care provider for more information and resources.  Taking classes to learn more about quitting smoking.  Looking for local organizations that offer resources about quitting smoking.  Joining a support group for people who want to quit smoking in your local community.  Where to find more information: You may find more information about quitting smoking from:  HelpGuide.org: www.helpguide.org/articles/addictions/how-to-quit-smoking.htm  BankRights.uy: smokefree.gov  American Lung Association: www.lung.org  Contact a health care provider if:  You have problems breathing.  Your lips, nose, or  fingers turn blue.  You have chest pain.  You are coughing up blood.  You feel faint or you pass out.  You have other noticeable changes that cause you to worry. Summary  Smoking tobacco can negatively affect your health, the health of those around you, your finances, and your social life.  Do not start smoking. Quit if you already do. If you need help quitting, ask your health care provider.  Think about joining a support group for people who want to quit smoking in your local community. There are many effective programs that will help you to quit this behavior. This information is not intended to replace advice given to you by your health care provider. Make sure you discuss any questions you have with your health care provider. Document Released: 06/02/2016 Document Revised: 06/02/2016 Document Reviewed: 06/02/2016 Elsevier  Interactive Patient Education  [employer].

## 2018-06-13 ENCOUNTER — Telehealth: Payer: Self-pay | Admitting: Internal Medicine

## 2018-06-13 MED ORDER — ISOSORBIDE MONONITRATE ER 30 MG PO TB24: 30.0000 mg | ORAL_TABLET | Freq: Every day | ORAL | 11 refills | Status: DC

## 2018-06-13 NOTE — Telephone Encounter (Signed)
Per verbal from MD, Rx imdur 30mg  QD.   Patient called and notified. Agreed w/plan.   Rx(s) sent to pharmacy electronically.

## 2018-06-13 NOTE — Telephone Encounter (Signed)
New Message   Pt c/o of Chest Pain: STAT if CP now or developed within 24 hours  1. Are you having CP right now? yes  2. Are you experiencing any other symptoms (ex. SOB, nausea, vomiting, sweating)? No   3. How long have you been experiencing CP? For about 2-3 days   4. Is your CP continuous or coming and going? Coming and going   5. Have you taken Nitroglycerin? Yes patient took two yesterday  ?

## 2018-06-13 NOTE — Telephone Encounter (Signed)
Returned call to patient of Dr. Rennis GoldenHilty who saw K. Lawrence DNP in Dec for CP. He reports L sided chest pain, shoulder/back pain for 4 days. He took NTG yesterday (1 pill on 2 separate occasions). He reports his BP today is  130/89. He denies SOB, N/V, does report some dizziness. He reports he feels better yesterday than today. He reports pain is continuous. He is wondering if this is cardiac pain vs MSK pain - he reports pain is reproducible with movement and is in his shoulder joints. He uses crutches for mobility d/t polio.   Per NP last note, it was mentioned to add imdur if he had recurrent chest pain  2. CAD: CTO of the RCA with L to R collaterals, 40% circumflex and LAD stent as a result of 95% stenosis.  If symptoms persist with increased BB may need to add isosorbide. Most recent stress test in March of 2019 negative for ischemia.   When ED visit was suggested, patient seemed reluctant. Advised would send message to MD

## 2018-06-18 ENCOUNTER — Emergency Department (HOSPITAL_COMMUNITY): Payer: Medicaid Other

## 2018-06-18 ENCOUNTER — Encounter (HOSPITAL_COMMUNITY): Payer: Self-pay | Admitting: *Deleted

## 2018-06-18 ENCOUNTER — Emergency Department (HOSPITAL_COMMUNITY)
Admission: EM | Admit: 2018-06-18 | Discharge: 2018-06-18 | Disposition: A | Discharge status: Home / Self Care | Payer: Medicaid Other | Attending: Emergency Medicine | Admitting: Emergency Medicine

## 2018-06-18 ENCOUNTER — Other Ambulatory Visit: Payer: Self-pay

## 2018-06-18 DIAGNOSIS — I2510 Atherosclerotic heart disease of native coronary artery without angina pectoris: Secondary | ICD-10-CM | POA: Diagnosis not present

## 2018-06-18 DIAGNOSIS — Z7982 Long term (current) use of aspirin: Secondary | ICD-10-CM | POA: Diagnosis not present

## 2018-06-18 DIAGNOSIS — F17210 Nicotine dependence, cigarettes, uncomplicated: Secondary | ICD-10-CM | POA: Diagnosis not present

## 2018-06-18 DIAGNOSIS — R110 Nausea: Secondary | ICD-10-CM | POA: Diagnosis not present

## 2018-06-18 DIAGNOSIS — Z955 Presence of coronary angioplasty implant and graft: Secondary | ICD-10-CM | POA: Diagnosis not present

## 2018-06-18 DIAGNOSIS — R079 Chest pain, unspecified: Secondary | ICD-10-CM | POA: Diagnosis present

## 2018-06-18 DIAGNOSIS — R42 Dizziness and giddiness: Secondary | ICD-10-CM | POA: Diagnosis not present

## 2018-06-18 DIAGNOSIS — I10 Essential (primary) hypertension: Secondary | ICD-10-CM | POA: Diagnosis not present

## 2018-06-18 DIAGNOSIS — R0789 Other chest pain: Secondary | ICD-10-CM | POA: Diagnosis not present

## 2018-06-18 LAB — BASIC METABOLIC PANEL
Anion gap: 12 (ref 5–15)
BUN: 9 mg/dL (ref 6–20)
CO2: 25 mmol/L (ref 22–32)
Calcium: 9.7 mg/dL (ref 8.9–10.3)
Chloride: 103 mmol/L (ref 98–111)
Creatinine, Ser: 0.81 mg/dL (ref 0.61–1.24)
GFR calc Af Amer: >60 mL/min (ref >60)
GFR calc non Af Amer: >60 mL/min (ref >60)
Glucose, Bld: 125 mg/dL — ABNORMAL HIGH (ref 70–99)
Potassium: 3.5 mmol/L (ref 3.5–5.1)
Sodium: 140 mmol/L (ref 135–145)

## 2018-06-18 LAB — CBC
HCT: 46 % (ref 39.0–52.0)
Hemoglobin: 14.9 g/dL (ref 13.0–17.0)
MCH: 29.5 pg (ref 26.0–34.0)
MCHC: 32.4 g/dL (ref 30.0–36.0)
MCV: 91.1 fL (ref 80.0–100.0)
NRBC: 0 % (ref 0.0–0.2)
Platelets: 211 10*3/uL (ref 150–400)
RBC: 5.05 MIL/uL (ref 4.22–5.81)
RDW: 13.2 % (ref 11.5–15.5)
WBC: 10.9 10*3/uL — ABNORMAL HIGH (ref 4.0–10.5)

## 2018-06-18 LAB — POCT I-STAT TROPONIN I: Troponin i, poc: 0 ng/mL (ref 0.00–0.08)

## 2018-06-18 MED ORDER — SODIUM CHLORIDE 0.9% FLUSH: 3.0000 mL | Freq: Once | INTRAVENOUS | Status: DC

## 2018-06-18 MED ORDER — ONDANSETRON 4 MG PO TBDP: 4.0000 mg | ORAL_TABLET | Freq: Three times a day (TID) | ORAL | 0 refills | Status: DC | PRN

## 2018-06-18 NOTE — ED Provider Notes (Signed)
[facility]-EMERGENCY DEPT Provider Note   CSN: [id number] Arrival date & time: 06/18/18  0138     History   Chief Complaint Chief Complaint  Patient presents with  . Chest Pain  . Back Pain  . Neck Pain    HPI John Mcintyre is a 48 y.o. male.  Patient with history of HTN, CAD s/p LD stent (2017), continuous smoker, HLD, Polio presents with complaint of pain in her left lateral chest, bilateral shoulders and back for the past 4 days. He is dizzy but has trouble describing his dizziness. He does not feel off balance when walking. He has nausea but no vomiting, fever, SOB, cough, congestion, sinus pressure. There has been no syncope or near syncope. The dizziness and chest discomfort is not related to position or movement. He has CAD but reports he does not feel this is "heart pain". He has not seen his primary care doctor or cardiologist for current symptoms.   The history is provided by the patient. No language interpreter was used.  Chest Pain  Associated symptoms: back pain, dizziness and nausea   Associated symptoms: no abdominal pain, no diaphoresis, no fever, no headache, no numbness, no shortness of breath, no vomiting and no weakness   Back Pain  Associated symptoms: chest pain   Associated symptoms: no abdominal pain, no fever, no headaches, no numbness and no weakness   Neck Pain  Associated symptoms: chest pain   Associated symptoms: no fever, no headaches, no numbness and no weakness     Past Medical History:  Diagnosis Date  . Coronary artery disease   . Hypercholesteremia   . Hypertension   . Polio     Patient Active Problem List   Diagnosis Date Noted  . Pre-operative cardiovascular examination 09/14/2017  . Pulmonary nodule 09/14/2017  . 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 Left lumbar radiculopathy 08/25/2017  . Neuropathic pain 10/15/2016  . Hypertensive urgency 04/19/2016  . CAD S/P LAD DES 12/23/15 12/23/2015  . Tobacco abuse  12/23/2015  . Essential hypertension 10/11/2014  . Hypercholesteremia 10/11/2014  . Chest pain 10/11/2014  . Polio     Past Surgical History:  Procedure Laterality Date  . CARDIAC CATHETERIZATION N/A 12/23/2015   Procedure: Left Heart Cath and Coronary Angiography;  Surgeon: Kathleene Hazelhristopher D McAlhany, MD;  [facility];  Service: Cardiovascular;  Laterality: N/A;  . CARDIAC CATHETERIZATION N/A 12/23/2015   Procedure: Coronary Stent Intervention;  Surgeon: Kathleene Hazelhristopher D McAlhany, MD;  Location: [facility] INVASIVE CV LAB;  Service: Cardiovascular;  Laterality: N/A;  . CORONARY STENT PLACEMENT  12/23/2015   Successful PTCA/DES x 1 proximal LAD  . HIP SURGERY Left 1987   for polio, not sure what was done  . KNEE SURGERY Left 1987   for polio        Home Medications    Prior to Admission medications   Medication Sig Start Date End Date Taking? Authorizing Provider  aspirin EC 81 MG tablet Take 81 mg by mouth daily.   Yes [provider]  isosorbide mononitrate (IMDUR) 30 MG 24 hr tablet Take 1 tablet (30 mg total) by mouth daily. 06/13/18 09/11/18 Yes Hilty, Lisette AbuKenneth C, MD  lovastatin (MEVACOR) 20 MG tablet TAKE 1 TABLET (20 MG TOTAL) BY MOUTH AT BEDTIME. Patient taking differently: Take 20 mg by mouth at bedtime.  01/28/18  Yes Hilty, Lisette AbuKenneth C, MD  metoprolol tartrate (LOPRESSOR) 50 MG tablet Take 1 tablet (50 mg total) by mouth 2 (two) times  daily. 50MG  IN THE AM AND 25MG  (1/2 TAB) IN THE PM Patient taking differently: Take 50 mg by mouth 2 (two) times daily. 100MG  IN THE AM AND 50MG   IN THE PM 05/16/18  Yes Jodelle Gross, NP  nitroGLYCERIN (NITROSTAT) 0.4 MG SL tablet Place 1 tablet (0.4 mg total) under the tongue every 5 (five) minutes as needed for chest pain. 12/18/15  Yes Rosalio Macadamia, NP  lisinopril (PRINIVIL,ZESTRIL) 20 MG tablet TAKE 1 TABLET BY MOUTH EVERY DAY Patient not taking: Reported on 06/18/2018 04/18/18   Chrystie Nose, MD    Family  History Family History  Problem Relation Age of Onset  . Hypertension Mother   . Heart failure Father     Social History Social History   Tobacco Use  . Smoking status: Current Every Day Smoker    Packs/day: 1.00    Types: Cigarettes  . Smokeless tobacco: Never Used  . Tobacco comment: ultra lights  Substance Use Topics  . Alcohol use: Yes    Comment: occ  . Drug use: No     Allergies   Poractant alfa; Pork-derived products; and Tape   Review of Systems Review of Systems  Constitutional: Negative for diaphoresis and fever.  HENT: Negative.   Respiratory: Negative.  Negative for shortness of breath.   Cardiovascular: Positive for chest pain.  Gastrointestinal: Positive for nausea. Negative for abdominal pain and vomiting.  Musculoskeletal: Positive for back pain and neck pain.  Neurological: Positive for dizziness. Negative for syncope, speech difficulty, weakness, numbness and headaches.     Physical Exam Updated Vital Signs BP (!) 144/103   Pulse 83   Temp 97.9 F (36.6 C) (Oral)   Resp 18   Ht 5\' 5"  (1.651 m)   Wt 74.8 kg   SpO2 94%   BMI 27.46 kg/m   Physical Exam Constitutional:      Appearance: He is well-developed. He is not ill-appearing.  HENT:     Head: Normocephalic.  Neck:     Musculoskeletal: Normal range of motion and neck supple.  Cardiovascular:     Rate and Rhythm: Normal rate and regular rhythm.  Pulmonary:     Effort: Pulmonary effort is normal.     Breath sounds: Normal breath sounds. No wheezing, rhonchi or rales.  Chest:     Chest wall: Tenderness (Mild left lateral chest wall tenderness. ) present.  Abdominal:     General: Bowel sounds are normal.     Palpations: Abdomen is soft.     Tenderness: There is no abdominal tenderness. There is no guarding or rebound.  Musculoskeletal: Normal range of motion.     Right lower leg: No edema.     Comments: Left lower extremity atrophy secondary to post-polio syndrome.   Skin:     General: Skin is warm and dry.     Findings: No rash.  Neurological:     Mental Status: He is alert and oriented to person, place, and time.     Comments: CN's 3-12 grossly intact. Speech is clear and focused. No facial asymmetry. No lateralizing weakness. Reflexes are equal, left LE not tested. No deficits of coordination. Ambulatory without imbalance with arm-canes per his usual.       ED Treatments / Results  Labs (all labs ordered are listed, but only abnormal results are displayed) Labs Reviewed  BASIC METABOLIC PANEL - Abnormal; Notable for the following components:      Result Value   Glucose, Bld 125 (*)  All other components within normal limits  CBC - Abnormal; Notable for the following components:   WBC 10.9 (*)    All other components within normal limits  I-STAT TROPONIN, ED  POCT I-STAT TROPONIN I   Results for orders placed or performed during the hospital encounter of 06/18/18  Basic metabolic panel  Result Value Ref Range   Sodium 140 135 - 145 mmol/L   Potassium 3.5 3.5 - 5.1 mmol/L   Chloride 103 98 - 111 mmol/L   CO2 25 22 - 32 mmol/L   Glucose, Bld 125 (H) 70 - 99 mg/dL   BUN 9 6 - 20 mg/dL   Creatinine, Ser 5.36 0.61 - 1.24 mg/dL   Calcium 9.7 8.9 - 64.4 mg/dL   GFR calc non Af Amer >60 >60 mL/min   GFR calc Af Amer >60 >60 mL/min   Anion gap 12 5 - 15  CBC  Result Value Ref Range   WBC 10.9 (H) 4.0 - 10.5 K/uL   RBC 5.05 4.22 - 5.81 MIL/uL   Hemoglobin 14.9 13.0 - 17.0 g/dL   HCT 03.4 74.2 - 59.5 %   MCV 91.1 80.0 - 100.0 fL   MCH 29.5 26.0 - 34.0 pg   MCHC 32.4 30.0 - 36.0 g/dL   RDW 63.8 75.6 - 43.3 %   Platelets 211 150 - 400 K/uL   nRBC 0.0 0.0 - 0.2 %  POCT i-Stat troponin I  Result Value Ref Range   Troponin i, poc 0.00 0.00 - 0.08 ng/mL   Comment 3            EKG EKG Interpretation  Date/Time:  Saturday June 18 2018 01:51:03 EST Ventricular Rate:  94 PR Interval:    QRS Duration: 92 QT Interval:  349 QTC  Calculation: 437 R Axis:   58 Text Interpretation:  Sinus rhythm Probable left atrial enlargement Probable anteroseptal infarct, old Minimal ST elevation, inferior leads ST elevation similar to previous in inferior leads from 2019 Confirmed by Marily Memos [phone]) on 06/18/2018 1:54:33 AM   Radiology Dg Chest 2 View  Result Date: 06/18/2018 CLINICAL DATA:  Chest and back pain for 4 days. Smoker. EXAM: CHEST - 2 VIEW COMPARISON:  03/29/2018 FINDINGS: Midline trachea. Normal heart size and mediastinal contours. No pleural effusion or pneumothorax. Moderate interstitial thickening. No lobar consolidation. IMPRESSION: 1. No acute cardiopulmonary disease. 2. Peribronchial thickening which may relate to chronic bronchitis or smoking. Electronically Signed   By: Jeronimo Greaves M.D.   On: 06/18/2018 02:57    Procedures Procedures (including critical care time)  Medications Ordered in ED Medications  sodium chloride flush (NS) 0.9 % injection 3 mL (has no administration in time range)     Initial Impression / Assessment and Plan / ED Course  I have reviewed the triage vital signs and the nursing notes.  Pertinent labs & imaging results that were available during my care of the patient were reviewed by me and considered in my medical decision making (see chart for details).     Patient to ED with symptoms of lateral left chest pain, bilateral shoulder and upper back pain x 4 days, dizziness x 1-2 days. No pain currently.  The patient is comfortable in appearance. He is reassured that labs, EKG and chest x-ray are unremarkable. He remains pain free in the ED. VSS. Do not feel symptoms represent ACS, unstable angina, infection, PE. He can be discharged home and follow up with his doctor this week for recheck.  Final Clinical Impressions(s) / ED Diagnoses   Final diagnoses:  None   1. Chest wall pain 2. Dizziness 3. Nausea   ED Discharge Orders    None       [name],  Cordelia Poche 06/19/18 0518    Mesner, Barbara Cower, MD 06/19/18 [phone]

## 2018-06-18 NOTE — ED Triage Notes (Signed)
Pt c/o chest, left upper back & neck pain x 4 days, has nausea, tired, dizziness.

## 2018-06-18 NOTE — Discharge Instructions (Addendum)
Take Zofran for nausea. Drink plenty of fluids and rest adequately. All your tests tonight are reassuring and you can be discharged. You need to contact your doctor and schedule an appointment for recheck of symptoms this week.

## 2018-07-06 ENCOUNTER — Ambulatory Visit: Payer: Medicaid Other | Admitting: Internal Medicine

## 2018-07-07 ENCOUNTER — Encounter: Payer: Self-pay | Admitting: *Deleted

## 2018-07-26 ENCOUNTER — Telehealth: Payer: Self-pay | Admitting: Internal Medicine

## 2018-07-26 MED ORDER — NITROGLYCERIN 0.4 MG SL SUBL: 0.4000 mg | SUBLINGUAL_TABLET | SUBLINGUAL | 6 refills | Status: DC | PRN

## 2018-07-26 NOTE — Telephone Encounter (Signed)
Updated Nitro RX sent in to the pharmacy per pt request.

## 2018-07-26 NOTE — Telephone Encounter (Signed)
New Message     *STAT* If patient is at the pharmacy, call can be transferred to refill team.   1. Which medications need to be refilled? (please list name of each medication and dose if known) Nitroglycerin  2. Which pharmacy/location (including street and city if local pharmacy) is medication to be sent to? [facility]  3. Do they need a 30 day or 90 day supply? 30 day

## 2018-08-07 NOTE — Progress Notes (Signed)
Cardiology Office Note   Date:  08/09/2018   ID:  John Mcintyre, DOB 10-22-1970, MRN [id number]  PCP:  Burnis Medin, PA-C  Cardiologist:  Dr. Rennis Golden  Chief Complaint  Patient presents with  . Follow-up    DISCUSS SMOKING AND DENTAL ISSUES  . Coronary Artery Disease  . Nicotine Dependence     History of Present Illness: John Mcintyre is a 48 y.o. male who presents for ongoing assessment and management of coronary artery disease, with cardiac catheterization on 12/23/2015 which revealed CTO of the RCA with left to right collaterals from the circumflex, a 40% circumflex, and a 95% proximal left anterior descending artery treated with a drug-eluting stent.  EF at that time was calculated at 45% to 50%.  On last office 05/16/2018 and was advised on decreased caffeine and to stop smoking. He did not tolerate Chantix I the past. He was seen in the ED on 06/18/2018 for complaints of chest wall pain and dizziness with back pain. Marland KitchenHe was ruled out for ACS and PE He was to follow up with PCP.    He comes today with desire to quit smoking. He would like to try Chantix again. He said he stopped it before because friends made him nervous about taking it. He wants to try it again. He also wants to have his teeth pulled as he is having pain and trouble eating. He has been seen by Dr. [name] who planned on doing the extraction but wanted him to follow up with cardiology first.    He denies chest pain, DOE, or fatigue. 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 He is medically compliant   Past Medical History:  Diagnosis Date  . Coronary artery disease   . Hypercholesteremia   . Hypertension   . Polio     Past Surgical History:  Procedure Laterality Date  . CARDIAC CATHETERIZATION N/A 12/23/2015   Procedure: Left Heart Cath and Coronary Angiography;  Surgeon: Kathleene Hazel, MD;  [facility] INVAS[facility] CV LAB;  Service: Cardiovascular;  Laterality: N/A;  . CARDIAC CATHETERIZATION  N/A 12/23/2015   Procedure: Coronary Stent Intervention;  Surgeon: Kathleene Hazel, MD;  Location: [facility] INVASIVE CV LAB;  Service: Cardiovascular;  Laterality: N/A;  . CORONARY STENT PLACEMENT  12/23/2015   Successful PTCA/DES x 1 proximal LAD  . HIP SURGERY Left 1987   for polio, not sure what was done  . KNEE SURGERY Left 1987   for polio     Current Outpatient Medications  Medication Sig Dispense Refill  . aspirin EC 81 MG tablet Take 81 mg by mouth daily.    . isosorbide mononitrate (IMDUR) 30 MG 24 hr tablet Take 1 tablet (30 mg total) by mouth daily. 30 tablet 11  . lisinopril (PRINIVIL,ZESTRIL) 20 MG tablet TAKE 1 TABLET BY MOUTH EVERY DAY 30 tablet 3  . lovastatin (MEVACOR) 20 MG tablet TAKE 1 TABLET (20 MG TOTAL) BY MOUTH AT BEDTIME. (Patient taking differently: Take 20 mg by mouth at bedtime. ) 90 tablet 3  . metoprolol tartrate (LOPRESSOR) 50 MG tablet Take 1 tablet (50 mg total) by mouth 2 (two) times daily. 50MG  IN THE AM AND 25MG  (1/2 TAB) IN THE PM (Patient taking differently: Take 50 mg by mouth 2 (two) times daily. 100MG  IN THE AM AND 50MG   IN THE PM) 45 tablet 6  . nitroGLYCERIN (NITROSTAT) 0.4 MG SL tablet Place 1 tablet (0.4 mg total) under the tongue every 5 (five) minutes  as needed for chest pain. 25 tablet 6  . ondansetron (ZOFRAN ODT) 4 MG disintegrating tablet Take 1 tablet (4 mg total) by mouth every 8 (eight) hours as needed for nausea or vomiting. 20 tablet 0  . varenicline (CHANTIX CONTINUING MONTH PAK) 1 MG tablet Take 1 tablet (1 mg total) by mouth 2 (two) times daily. 56 tablet 0  . varenicline (CHANTIX STARTING MONTH PAK) 0.5 MG X 11 & 1 MG X 42 tablet Take one 0.5 mg tablet by mouth once daily for 3 days, then increase to one 0.5 mg tablet twice daily for 4 days, then increase to one 1 mg tablet twice daily. 53 tablet 0   No current facility-administered medications for this visit.     Allergies:   Poractant alfa; Pork-derived products; and Tape     Social History:  The patient  reports that he has been smoking cigarettes. He has been smoking about 1.00 pack per day. He has never used smokeless tobacco. He reports current alcohol use. He reports that he does not use drugs.   Family History:  The patient's family history includes Heart failure in his father; Hypertension in his mother.    ROS: All other systems are reviewed and negative. Unless otherwise mentioned in H&P    PHYSICAL EXAM: VS:  BP 132/74 (BP Location: Left Arm, Patient Position: Sitting, Cuff Size: Normal)   Pulse 92   Ht  (1.651 m)   Wt 171 lb (77.6 kg)   BMI 28.46 kg/m  , BMI Body mass index is 28.46 kg/m. GEN: Well nourished, well developed, in no acute distress HEENT: normal.Poor dentition, with some missing teeth.   Neck: no JVD, carotid bruits, or masses Cardiac: RRR;tachycardia,  no murmurs, rubs, or gallops,no edema  Respiratory:  Clear to auscultation bilaterally, normal work of breathing GI: soft, nontender, nondistended, + BS MS: no deformity or atrophy Skin: warm and dry, no rash Neuro:  Strength and sensation are intact Psych: euthymic mood, full affect   EKG:  Not completed this office visit.  Recent Labs: 06/18/2018: BUN 9; Creatinine, Ser 0.81; Hemoglobin 14.9; Platelets 211; Potassium 3.5; Sodium 140    Lipid Panel    Component Value Date/Time   CHOL 158 12/18/2015 1016   TRIG 302 (H) 12/18/2015 1016   HDL 25 (L) 12/18/2015 1016   CHOLHDL 6.3 (H) 12/18/2015 1016   VLDL 60 (H) 12/18/2015 1016   LDLCALC 73 12/18/2015 1016      Wt Readings from Last 3 Encounters:  08/09/18 171 lb (77.6 kg)  06/18/18 165 lb (74.8 kg)  05/16/18 169 lb (76.7 kg)      Other studies Reviewed: NM Stress Test 2017/09/06  Nuclear stress EF: 42%. Distal apical anteroseptal akinesis.  The left ventricular ejection fraction is moderately decreased (30-44%).  There was no ST segment deviation noted during stress.  Defect 1: There is a medium  defect of moderate severity present in the mid anteroseptal and apical anterior location.  Findings consistent with prior myocardial infarction with peri-infarct ischemia. Mild increase in size of area involved when compared to prior study. Prior LAD stent.  This is an intermediate risk study.  Conclusion 12/23/2015    Prox RCA lesion, 100 %stenosed.  Prox Cx lesion, 40 %stenosed.  Dist Cx lesion, 40 %stenosed.  There is mild left ventricular systolic dysfunction.  LV end diastolic pressure is normal.  The left ventricular ejection fraction is 45-50% by visual estimate.  There is no mitral valve regurgitation.  A drug eluting stent was successfully placed.  Prox LAD lesion, 95 %stenosed.  Post intervention, there is a 0% residual stenosis.   1. Double vessel CAD with unstable angina.  2. Chronic occlusion of the proximal RCA with filling of the mid and distal vessel from left to right collaterals.  3. Severe stenosis proximal LAD 4. Moderate proximal Circumflex stenosis 5. Successful PTCA/DES x 1 proximal LAD 6. Mild LV systolic dysfunction  Recommendations: Will continue ASA, Plavix for one year. Continue beta blocker and statin. Would consider changing to high dose Lipitor at discharge. Smoking cessation     ASSESSMENT AND PLAN:  1. CAD: He offers no cardiac symptoms of chest pain or DOE. 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 He is medically compliant. No planned testing or changes in his regimen.   2. Tobacco abuse: I will give him Rx for Chantix to assist with smoking cessation.He denies history of seizures or psychiatric disorders.  He states that he smokes 2 ppd and really has a desire to quit. Will give him starter pack with refills. I have encouraged him to quit, to prevent progression of CAD. He verbalizes understanding.   3. Poor dentition: He is ok from CV standpoint to have teeth extraction by Dr, [name]. Will send copy of this note to him.    4. Hypertension: BP is well controlled. No changes in his regimen at this time.    Current medicines are reviewed at length with the patient today.    Labs/ tests ordered today include: None  Bettey Mare. Liborio Nixon, ANP, AACC   08/09/2018 3:20 PM    [facility] [address] Office [phone] Fax [phone]

## 2018-08-09 ENCOUNTER — Other Ambulatory Visit: Payer: Self-pay

## 2018-08-09 ENCOUNTER — Ambulatory Visit (INDEPENDENT_AMBULATORY_CARE_PROVIDER_SITE_OTHER): Payer: Medicaid Other | Admitting: Adult Health

## 2018-08-09 ENCOUNTER — Encounter: Payer: Self-pay | Admitting: Adult Health

## 2018-08-09 VITALS — BP 132/74 | HR 92 | Ht 65.0 in | Wt 171.0 lb

## 2018-08-09 DIAGNOSIS — Z720 Tobacco use: Secondary | ICD-10-CM

## 2018-08-09 DIAGNOSIS — K089 Disorder of teeth and supporting structures, unspecified: Secondary | ICD-10-CM | POA: Diagnosis not present

## 2018-08-09 DIAGNOSIS — I2510 Atherosclerotic heart disease of native coronary artery without angina pectoris: Secondary | ICD-10-CM | POA: Diagnosis not present

## 2018-08-09 DIAGNOSIS — I10 Essential (primary) hypertension: Secondary | ICD-10-CM

## 2018-08-09 MED ORDER — VARENICLINE TARTRATE 1 MG PO TABS: 1.0000 mg | ORAL_TABLET | Freq: Two times a day (BID) | ORAL | 7 refills | Status: DC

## 2018-08-09 MED ORDER — VARENICLINE TARTRATE 0.5 MG X 11 & 1 MG X 42 PO MISC: ORAL | 0 refills | Status: DC

## 2018-08-09 MED ORDER — VARENICLINE TARTRATE 1 MG PO TABS: 1.0000 mg | ORAL_TABLET | Freq: Two times a day (BID) | ORAL | 0 refills | Status: DC

## 2018-08-09 NOTE — Patient Instructions (Signed)
OK TO HAVE DENTAL PROCEDURES Follow-Up: You will need a follow up appointment in 6 months.  Please call our office 2 months in advance to schedule this appointment.  You may see Chrystie Nose, MD, Joni Reining, DNP, AACC  or one of the following Advanced Practice Providers on your designated Care Team:  Theodore Demark, PA-C, Joni Reining, DNP, AACC   Medication Instructions:  START CHANTIX-PLEASE CALL WITH ANY ISSUES  Your physician recommends that you continue on your current medications as directed. Please refer to the Current Medication list given to you today. If you need a refill on your cardiac medications before your next appointment, please call your pharmacy. Labwork: When you have labs (blood work) and your tests are completely normal, you will receive your results ONLY by MyChart Message (if you have MyChart) -OR- A paper copy in the mail.  At [facility], you and your health needs are our priority.  As part of our continuing mission to provide you with exceptional heart care, we have created designated Provider Care Teams.  These Care Teams include your primary Cardiologist (physician) and Advanced Practice Providers (APPs -  Physician Assistants and Nurse Practitioners) who all work together to provide you with the care you need, when you need it.  Thank you for choosing [facility] at [facility]!!

## 2018-08-18 ENCOUNTER — Ambulatory Visit: Admit: 2018-08-18 | Payer: Medicaid Other | Admitting: Oral Surgery

## 2018-08-18 SURGERY — DENTAL RESTORATION/EXTRACTIONS
Anesthesia: General | Laterality: Bilateral

## 2018-08-29 ENCOUNTER — Other Ambulatory Visit: Payer: Self-pay | Admitting: Internal Medicine

## 2018-09-14 ENCOUNTER — Emergency Department (HOSPITAL_COMMUNITY): Payer: Medicaid Other

## 2018-09-14 ENCOUNTER — Emergency Department (HOSPITAL_COMMUNITY)
Admission: EM | Admit: 2018-09-14 | Discharge: 2018-09-14 | Disposition: A | Discharge status: Home / Self Care | Payer: Medicaid Other | Attending: Emergency Medicine | Admitting: Emergency Medicine

## 2018-09-14 ENCOUNTER — Telehealth: Payer: Self-pay | Admitting: Internal Medicine

## 2018-09-14 ENCOUNTER — Other Ambulatory Visit: Payer: Self-pay

## 2018-09-14 ENCOUNTER — Encounter (HOSPITAL_COMMUNITY): Payer: Self-pay | Admitting: Emergency Medicine

## 2018-09-14 DIAGNOSIS — F17210 Nicotine dependence, cigarettes, uncomplicated: Secondary | ICD-10-CM | POA: Insufficient documentation

## 2018-09-14 DIAGNOSIS — Z7982 Long term (current) use of aspirin: Secondary | ICD-10-CM | POA: Diagnosis not present

## 2018-09-14 DIAGNOSIS — Z79899 Other long term (current) drug therapy: Secondary | ICD-10-CM | POA: Insufficient documentation

## 2018-09-14 DIAGNOSIS — X58XXXA Exposure to other specified factors, initial encounter: Secondary | ICD-10-CM | POA: Diagnosis not present

## 2018-09-14 DIAGNOSIS — I10 Essential (primary) hypertension: Secondary | ICD-10-CM | POA: Insufficient documentation

## 2018-09-14 DIAGNOSIS — Y929 Unspecified place or not applicable: Secondary | ICD-10-CM | POA: Diagnosis not present

## 2018-09-14 DIAGNOSIS — R0789 Other chest pain: Secondary | ICD-10-CM | POA: Insufficient documentation

## 2018-09-14 DIAGNOSIS — S39012A Strain of muscle, fascia and tendon of lower back, initial encounter: Secondary | ICD-10-CM | POA: Insufficient documentation

## 2018-09-14 DIAGNOSIS — Y999 Unspecified external cause status: Secondary | ICD-10-CM | POA: Insufficient documentation

## 2018-09-14 DIAGNOSIS — Y939 Activity, unspecified: Secondary | ICD-10-CM | POA: Diagnosis not present

## 2018-09-14 DIAGNOSIS — Z789 Other specified health status: Secondary | ICD-10-CM

## 2018-09-14 LAB — CBC
HCT: 44 % (ref 39.0–52.0)
Hemoglobin: 14.6 g/dL (ref 13.0–17.0)
MCH: 29.6 pg (ref 26.0–34.0)
MCHC: 33.2 g/dL (ref 30.0–36.0)
MCV: 89.2 fL (ref 80.0–100.0)
Platelets: 209 10*3/uL (ref 150–400)
RBC: 4.93 MIL/uL (ref 4.22–5.81)
RDW: 13.7 % (ref 11.5–15.5)
WBC: 12.6 10*3/uL — ABNORMAL HIGH (ref 4.0–10.5)
nRBC: 0 % (ref 0.0–0.2)

## 2018-09-14 LAB — BASIC METABOLIC PANEL
Anion gap: 14 (ref 5–15)
BUN: 7 mg/dL (ref 6–20)
CO2: 21 mmol/L — ABNORMAL LOW (ref 22–32)
Calcium: 9.6 mg/dL (ref 8.9–10.3)
Chloride: 104 mmol/L (ref 98–111)
Creatinine, Ser: 0.73 mg/dL (ref 0.61–1.24)
GFR calc Af Amer: >60 mL/min (ref >60)
GFR calc non Af Amer: >60 mL/min (ref >60)
Glucose, Bld: 107 mg/dL — ABNORMAL HIGH (ref 70–99)
Potassium: 3.5 mmol/L (ref 3.5–5.1)
Sodium: 139 mmol/L (ref 135–145)

## 2018-09-14 LAB — TROPONIN I: Troponin I: <0.03 ng/mL (ref <0.03)

## 2018-09-14 MED ORDER — METHOCARBAMOL 500 MG PO TABS: 500.0000 mg | ORAL_TABLET | Freq: Two times a day (BID) | ORAL | 0 refills | Status: DC

## 2018-09-14 MED ORDER — SODIUM CHLORIDE 0.9% FLUSH: 3.0000 mL | Freq: Once | INTRAVENOUS | Status: DC

## 2018-09-14 NOTE — Discharge Instructions (Addendum)
Thank you for allowing me to care for you today in the Emergency Department.   Pressure goes up and down all the time.  Patient not checking her blood pressure multiple times a day.  I have attached information how to correctly check it at home.  I would recommend only checking it once or twice a week.  Your symptoms are consistent with musculoskeletal pain.  You can take 1 tablet of Robaxin 2 times daily.  This is a muscle relaxer.  Do not work or drive until you know how this medication affects you because it may make you drowsy.  It is safe to take with 600 mg of ibuprofen, which you can take with food once every 6 hours help with pain and swelling.  I think that you would benefit from trying yoga for exercise to help with your pain.  Would like as below.  You can also google yoga exercises for polio patients to watch videos online.  https://www.weaver.info/   You should consider cutting back on your black tea intake as each cup contains caffeine.  You should also consider cutting back on the amount of cigarettes that you smoke daily as this may be contributing to your dizziness.  Follow-up with your eye doctor if you continue to have worsening dizziness and eye straining after looking at a computer screen for several hours.  Follow-up with your primary care provider if you continue to have concerns regarding your sexual health and wellbeing.  Return to the emergency department if you develop chest pain with severe shortness of breath, high fever, if your lips or fingers turn blue, if you pass out, if the pain goes up your neck, if you have severe swelling in your legs, develop respiratory distress, or other new, concerning symptoms.

## 2018-09-14 NOTE — Telephone Encounter (Signed)
Pt reports that he was in the ER with chest pain today and he was advised it was his "muscles".. they told him that his BP is elevated 160/117 and to call our office after he returns home.. he says he is feeling better but always has a headache and his BP is "always" this high.. he is asking if we can make any ned changes. Pt saw Joni Reining NP 07/2018 will forward to her for further recommendations.

## 2018-09-14 NOTE — ED Notes (Signed)
Pt transported to xray 

## 2018-09-14 NOTE — ED Triage Notes (Signed)
Pt reports left sided chest pain and hypertension X4 days.  Pt states pain/pressure goes under his shoulder and to his back.

## 2018-09-14 NOTE — ED Notes (Signed)
Patient verbalizes understanding of discharge instructions. Opportunity for questioning and answers were provided. Armband removed by staff, pt discharged from ED ambulatory.   

## 2018-09-14 NOTE — ED Provider Notes (Signed)
[facility] EMERGENCY DEPARTMENT Provider Note   CSN: [id number] Arrival date & time: 09/14/18  0225    History   Chief Complaint Chief Complaint  Patient presents with   Chest Pain   Hypertension    HPI John Mcintyre is a 48 y.o. male with a history of polio, CAD s/p LAD DES 7/17, HTN, hypercholesteremia, post polio syndrome, left lumbar radiculopathy, neuropathic pain who presents to the emergency department with a chief complaint of left chest and rib pain for the last 4 days.  Pain is intermittent.  He is unable to characterize the pain.  He reports that the pain radiates up to his left shoulder.  He reports a history of similar pain.  No known aggravating or alleviating factors.  He denies new exercises, falls, or injuries.  He also reports that he has been having some right-sided low back pain.  States " I was told that my right leg is longer than my left leg and I was seen by a neurosurgeon, but they did not offer to do anything about it.  I do not like when people have that kind of negativity."  The patient reports that he has been concerned about the chest pain so he has been checking his blood pressure 7-8 times per day over the last 3 to 4 days.  He reports that his systolic blood pressure will fluctuate from the 130s to the 160s sporadically.  He reports that he has been compliant with all of his home medications, including his home antihypertensives.  He reports that when he previously had similar pain that seem to be better when he was able to swim in the pool and get massages.  He reports that he has been unable to continue these activities since the COVID-19 pandemic.  He reports that he did take ibuprofen earlier today with some improvement in his pain.  He also reports that he has been having some intermittent dizziness that seems to be worse when he stares at a computer screen for long periods of time.  He states that sometimes he will look at a computer  screen for 7 to 8 hours a day.  He states one time they told me I had a sinus infection.  Is it possible that I have another sinus infection?"  He denies fever, chills, cough, back pain, nausea, vomiting, diarrhea, abdominal pain, urinary symptoms, rash, syncope, diplopia, leg swelling, orthopnea.  The patient's cardiologist is Dr. Rennis Golden.  He was last seen on March 10.  At his last appointment in December 2019, he was counseled on decreasing his caffeine intake and to stop smoking.  He did not tolerate Chantix in the past.      The history is provided by the patient. No language interpreter was used.    Past Medical History:  Diagnosis Date   Coronary artery disease    Hypercholesteremia    Hypertension    Polio     Patient Active Problem List   Diagnosis Date Noted   Pre-operative cardiovascular examination 09/14/2017   Pulmonary nodule 09/14/2017   Post-polio syndrome 08/25/2017   Left lumbar radiculopathy 08/25/2017   Neuropathic pain 10/15/2016   Hypertensive urgency 04/19/2016   CAD S/P LAD DES 12/23/15 12/23/2015   Tobacco abuse 12/23/2015   Essential hypertension 10/11/2014   Hypercholesteremia 10/11/2014   Chest pain 10/11/2014   Polio     Past Surgical History:  Procedure Laterality Date   CARDIAC CATHETERIZATION N/A 12/23/2015   Procedure: Left  Heart Cath and Coronary Angiography;  Surgeon: Kathleene Hazelhristopher D McAlhany, MD;  [facility];  Service: Cardiovascular;  Laterality: N/A;   CARDIAC CATHETERIZATION N/A 12/23/2015   Procedure: Coronary Stent Intervention;  Surgeon: Kathleene Hazelhristopher D McAlhany, MD;  [facility];  Service: Cardiovascular;  Laterality: N/A;   CORONARY STENT PLACEMENT  12/23/2015   Successful PTCA/DES x 1 proximal LAD   HIP SURGERY Left 1987   for polio, not sure what was done   KNEE SURGERY Left 1987   for polio        Home Medications    Prior to Admission medications   Medication Sig Start  Date End Date Taking? Authorizing Provider  aspirin EC 81 MG tablet Take 81 mg by mouth daily.    [provider]  isosorbide mononitrate (IMDUR) 30 MG 24 hr tablet Take 1 tablet (30 mg total) by mouth daily. 06/13/18 09/11/18  Hilty, Lisette AbuKenneth C, MD  lisinopril (PRINIVIL,ZESTRIL) 20 MG tablet TAKE 1 TABLET BY MOUTH EVERY DAY 08/29/18   Hilty, Lisette AbuKenneth C, MD  lovastatin (MEVACOR) 20 MG tablet TAKE 1 TABLET (20 MG TOTAL) BY MOUTH AT BEDTIME. Patient taking differently: Take 20 mg by mouth at bedtime.  01/28/18   Hilty, Lisette AbuKenneth C, MD  methocarbamol (ROBAXIN) 500 MG tablet Take 1 tablet (500 mg total) by mouth 2 (two) times daily. 09/14/18   Lavida Patch A, PA-C  metoprolol tartrate (LOPRESSOR) 50 MG tablet Take 1 tablet (50 mg total) by mouth 2 (two) times daily. 50MG  IN THE AM AND 25MG  (1/2 TAB) IN THE PM Patient taking differently: Take 50 mg by mouth 2 (two) times daily. 100MG  IN THE AM AND 50MG   IN THE PM 05/16/18   Jodelle GrossLawrence, Kathryn M, NP  nitroGLYCERIN (NITROSTAT) 0.4 MG SL tablet Place 1 tablet (0.4 mg total) under the tongue every 5 (five) minutes as needed for chest pain. 07/26/18   Hilty, Lisette AbuKenneth C, MD  ondansetron (ZOFRAN ODT) 4 MG disintegrating tablet Take 1 tablet (4 mg total) by mouth every 8 (eight) hours as needed for nausea or vomiting. 06/18/18   Elpidio AnisUpstill, Shari, PA-C  varenicline (CHANTIX CONTINUING MONTH PAK) 1 MG tablet Take 1 tablet (1 mg total) by mouth 2 (two) times daily. 08/09/18   Hilty, Lisette AbuKenneth C, MD  varenicline (CHANTIX STARTING MONTH PAK) 0.5 MG X 11 & 1 MG X 42 tablet Take one 0.5 mg tablet by mouth once daily for 3 days, then increase to one 0.5 mg tablet twice daily for 4 days, then increase to one 1 mg tablet twice daily. 08/09/18   Hilty, Lisette AbuKenneth C, MD    Family History Family History  Problem Relation Age of Onset   Hypertension Mother    Heart failure Father     Social History Social History   Tobacco Use   Smoking status: Current Every Day Smoker     Packs/day: 1.00    Types: Cigarettes   Smokeless tobacco: Never Used   Tobacco comment: ultra lights  Substance Use Topics   Alcohol use: Yes    Comment: occ   Drug use: No     Allergies   Poractant alfa; Pork-derived products; and Tape   Review of Systems Review of Systems  Constitutional: Negative for appetite change, chills and fever.  HENT: Negative for congestion, nosebleeds, sinus pressure, sinus pain and sore throat.   Respiratory: Negative for shortness of breath and wheezing.   Cardiovascular: Positive for chest pain. Negative for palpitations and leg  swelling.  Gastrointestinal: Negative for abdominal pain, diarrhea, nausea and vomiting.  Genitourinary: Negative for dysuria, penile pain and urgency.  Musculoskeletal: Positive for arthralgias, back pain and myalgias.  Skin: Negative for rash.  Allergic/Immunologic: Negative for immunocompromised state.  Neurological: Positive for dizziness. Negative for syncope, weakness, numbness and headaches.  Hematological: Negative for adenopathy. Does not bruise/bleed easily.  Psychiatric/Behavioral: Negative for confusion.   Physical Exam Updated Vital Signs BP (!) 145/99    Pulse 82    Temp 98.6 F (37 C) (Oral)    Resp 16    SpO2 100%   Physical Exam Vitals signs and nursing note reviewed.  Constitutional:      Appearance: He is well-developed.  HENT:     Head: Normocephalic.     Nose:     Right Sinus: No maxillary sinus tenderness or frontal sinus tenderness.     Left Sinus: No maxillary sinus tenderness or frontal sinus tenderness.  Eyes:     Conjunctiva/sclera: Conjunctivae normal.  Neck:     Musculoskeletal: Normal range of motion and neck supple. No neck rigidity or muscular tenderness.     Vascular: No carotid bruit.  Cardiovascular:     Rate and Rhythm: Normal rate and regular rhythm.     Pulses: Normal pulses.     Heart sounds: Normal heart sounds. No murmur. No friction rub. No gallop.      Comments:  Reproducible tenderness to palpation to the left lateral chest wall, just no area or to the axilla.  No rashes, malalignment, or warmth.  No crepitus or step-offs.  No focal bony tenderness. Pulmonary:     Effort: Pulmonary effort is normal. No respiratory distress.     Breath sounds: No stridor. No wheezing, rhonchi or rales.  Chest:     Chest wall: No tenderness.  Abdominal:     General: There is no distension.     Palpations: Abdomen is soft. There is no mass.     Tenderness: There is no abdominal tenderness. There is no right CVA tenderness, left CVA tenderness, guarding or rebound.     Hernia: No hernia is present.  Musculoskeletal:        General: No swelling, tenderness or deformity.     Right lower leg: No edema.     Left lower leg: No edema.     Comments: No tenderness to the cervical, thoracic, or lumbar spinous processes or bilateral paraspinal muscles.   Lymphadenopathy:     Cervical: No cervical adenopathy.  Skin:    General: Skin is warm and dry.  Neurological:     Mental Status: He is alert.  Psychiatric:        Behavior: Behavior normal.      ED Treatments / Results  Labs (all labs ordered are listed, but only abnormal results are displayed) Labs Reviewed  BASIC METABOLIC PANEL - Abnormal; Notable for the following components:      Result Value   CO2 21 (*)    Glucose, Bld 107 (*)    All other components within normal limits  CBC - Abnormal; Notable for the following components:   WBC 12.6 (*)    All other components within normal limits  TROPONIN I    EKG EKG Interpretation  Date/Time:  Wednesday September 14 2018 02:30:52 EDT Ventricular Rate:  90 PR Interval:  172 QRS Duration: 90 QT Interval:  364 QTC Calculation: 445 R Axis:   47 Text Interpretation:  Normal sinus rhythm Septal infarct , age  undetermined Abnormal ECG No significant change since last tracing other than rate is slower Confirmed by Rochele Raring [phone]) on 09/14/2018 5:21:38  AM   Radiology Dg Chest 2 View  Result Date: 09/14/2018 CLINICAL DATA:  Left-sided chest pain EXAM: CHEST - 2 VIEW COMPARISON:  06/18/2018 FINDINGS: The heart size and mediastinal contours are within normal limits. Both lungs are clear. The visualized skeletal structures are unremarkable. IMPRESSION: No active cardiopulmonary disease. Electronically Signed   By: Alcide Clever M.D.   On: 09/14/2018 03:02    Procedures Procedures (including critical care time)  Medications Ordered in ED Medications  sodium chloride flush (NS) 0.9 % injection 3 mL (has no administration in time range)     Initial Impression / Assessment and Plan / ED Course  I have reviewed the triage vital signs and the nursing notes.  Pertinent labs & imaging results that were available during my care of the patient were reviewed by me and considered in my medical decision making (see chart for details).        48 year old male with a history of polio, CAD s/p LAD DES 7/17, HTN, hypercholesteremia, post polio syndrome, left lumbar radiculopathy, and neuropathic pain who presents to the Emergency Department with a chief complaint of chest pain that radiates to the left shoulder that has been intermittent over the last 3 to 4 days.  He is also endorsing some right-sided low back pain that is chronic.  No other associated symptoms.  No constitutional symptoms.  Per chart review, the patient has been evaluated in the ER multiple times previously for similar symptoms.  He is also endorsing some dizziness that he states is worse after staring at a computer screen for several hours.  He was last seen by ophthalmology last year and he states that he was advised to wear bifocals, but he does not like to wear them and has not been utilizing them at home.  He was also seen by cardiology last month and was advised to cut back on caffeine intake and initiate tobacco cessation.  He reports that he has not made any changes in his tobacco  intake.  He also reports that he continues to drink 6 to 7 cups of black tea daily and has no desire to cut back.  Dizziness does not seem vertiginous in nature.  In regards to the patient's chest pain, he has reproducible chest pain over the left lateral ribs.  EKG with normal sinus rhythm with no acute changes.  He has a mild leukocytosis of 12.6, but this appears chronic.  Troponin is negative.  Chest x-ray is unremarkable.  Labs are otherwise reassuring.  I question if the patient's musculoskeletal pain is post polio syndrome versus unintentionally placing more weight on his left side due to his right leg being slightly longer.  He was neurovascularly intact to the bilateral lower extremities.  Pain does not seem consistent with ACS, unstable angina, PE, esophageal rupture, pericarditis, myocarditis, pneumonia.  Will discharge home with Robaxin and recommended gentle stretching.  He can continue to follow-up with cardiology as needed.  Discussed smoking cessation with patient and was they were offerred resources to help stop.  Total time was 5 min CPT code 88891.  He was also advised to reduce his caffeine intake.  He was also advised not to check his blood pressure 6-7 times per day and was counseled on the transient hemodynamic changes of blood pressure.  Strict return precautions given.  He is hemodynamically stable and  in no acute distress.  Safe for discharge home with outpatient follow-up this time.   Final Clinical Impressions(s) / ED Diagnoses   Final diagnoses:  Chest wall pain  Acute myofascial strain of lumbar region, initial encounter  Excessive caffeine intake    ED Discharge Orders         Ordered    methocarbamol (ROBAXIN) 500 MG tablet  2 times daily     09/14/18 0425           Clarabell Matsuoka A, PA-C 09/14/18 0524    Ward, Layla Maw, DO 09/14/18 1191

## 2018-09-14 NOTE — Telephone Encounter (Signed)
New message   Pt c/o BP issue: STAT if pt c/o blurred vision, one-sided weakness or slurred speech  1. What are your last 5 BP readings? 160/117  2. Are you having any other symptoms (ex. Dizziness, headache, blurred vision, passed out)? dizziness  3. What is your BP issue? Patient's b/p is elevated

## 2018-09-15 MED ORDER — AMLODIPINE BESYLATE 5 MG PO TABS: 5.0000 mg | ORAL_TABLET | Freq: Every day | ORAL | 3 refills | Status: DC

## 2018-09-15 NOTE — Telephone Encounter (Signed)
Pt informed of providers result & recommendations. Pt verbalized understanding. No further questions. He will call PCP re:non cardiac CP and discuss treatment. He states that he will start amlodipine and start taking his BP daily. He will make a log to review. He will take his BP when he is having this chest pain. He states that he is having intermittent LE pain. Discussed treatment, tylenol, rest, elevation and he states that heat helps also. He states that he will do this and discss this at appt next Wednesday's appointment w/Kathryn.

## 2018-09-15 NOTE — Addendum Note (Signed)
Addended by: Alyson Ingles on: 09/15/2018 08:44 AM   Modules accepted: Orders

## 2018-09-15 NOTE — Telephone Encounter (Signed)
He will need to begin amlodipine 5 mg daily for better BP control. He was to have stopped smoking and decreased his caffeine use. Have him make a follow up appointment with me next week. Take BP daily and record, so that we can talk about his readings.

## 2018-09-15 NOTE — Telephone Encounter (Signed)
Thank you :)

## 2018-09-20 ENCOUNTER — Telehealth: Payer: Self-pay | Admitting: Adult Health

## 2018-09-20 NOTE — Telephone Encounter (Signed)
Mychart pending, smartphone, pre reg complete 09/20/18 AF °

## 2018-09-20 NOTE — Progress Notes (Addendum)
Virtual Visit via Telephone Note   This visit type was conducted due to national recommendations for restrictions regarding the COVID-19 Pandemic (e.g. social distancing) in an effort to limit this patient's exposure and mitigate transmission in our community.  Due to his co-morbid illnesses, this patient is at least at moderate risk for complications without adequate follow up.  This format is felt to be most appropriate for this patient at this time.  The patient did not have access to video technology/had technical difficulties with video requiring transitioning to audio format only (telephone).  All issues noted in this document were discussed and addressed.  No physical exam could be performed with this format.  Please refer to the patient's chart for his  consent to telehealth for [facility].   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 male we are seeing for ongoing assessment and management of coronary artery disease, he had a cardiac catheterization on 12/23/2015 which revealed CTO of the RCA with left-to-right collaterals from the circumflex, a 40% circumflex and a 95% proximal LAD treated with a DES.  EF at that time was calculated at 45% to 50%.  On last office visit on 08/09/2018, he was doing well but had a strong desire to quit smoking.  He had been on Chantix in the past but he stopped taking it due to fears that he had from what other people had told him about the medication.  He was willing to try it again.  He also wanted  to have his teeth pulled as he was having trouble with pain and trouble eating.  He was due to have extraction by Dr. [name].   Unfortunately, the patient presented to the emergency room on 09/14/2018 with complaints of left-sided chest pain and rib pain for 4 days.  He has been seen in the emergency room on multiple occasions for recurrent chest pain and had been ruled out for ACS.  He also complained of some mild dizziness after staring at a computer screen for 7 to 8 hours.  He had been prescribed bifocals but does not like to wear them.  He again was ruled out for ACS he was not found to have any evidence of CHF or pneumonia.  He was diagnosed with musculoskeletal pain versus post polio syndrome versus unintentionally placing more weight on his left side due to his right leg being slightly longer in the setting of polio.  He unfortunately had not quit smoking.  He was given a prescription for Robaxin and recommended for stretching exercises.  He was advised not to take his blood pressure compulsively as he had been doing up to 6-7 times a day.  He called our office on 09/15/2018 with elevated blood pressure.  He was started on amlodipine 5 mg daily for hypertension he was to take his blood pressure daily and record he was to stop caffeine use and continue smoking cessation.  He did not stop smoking stating that Chantix made him feel badly. He  also states that his BP machine is not working well. BP has gotten a little lower in the 140's systolic.  He ran out of his isosorbide.   The patient does not have symptoms concerning for COVID-19 infection (fever, chills, cough, or new shortness of breath).   Past Medical History:  Diagnosis Date  . Coronary artery disease   . Hypercholesteremia   . Hypertension   . Polio    Past Surgical History:  Procedure Laterality Date  . CARDIAC CATHETERIZATION N/A 12/23/2015   Procedure: Left Heart Cath and Coronary Angiography;  Surgeon: Kathleene Hazel, MD;  [facility]: [facility] INVASIVE CV LAB;  Service: Cardiovascular;  Laterality: N/A;  . CARDIAC CATHETERIZATION N/A 12/23/2015   Procedure: Coronary Stent Intervention;  Surgeon: Kathleene Hazel, MD;  Location: [facility] INVASIVE CV LAB;  Service: Cardiovascular;  Laterality: N/A;  . CORONARY STENT PLACEMENT  12/23/2015   Successful PTCA/DES x 1 proximal LAD  . HIP SURGERY Left 1987   for polio, not sure what was done  . KNEE SURGERY Left 1987   for polio     No outpatient medications have been marked as taking for the 09/21/18 encounter (Appointment) with Jodelle Gross, NP.     Allergies:   Poractant alfa; Pork-derived products; and Tape   Social History   Tobacco Use  . Smoking status: Current Every Day Smoker    Packs/day: 1.00    Types: Cigarettes  . Smokeless tobacco: Never Used  . Tobacco comment: ultra lights  Substance Use Topics  . Alcohol use: Yes    Comment: occ  . Drug use: No     Family Hx: The patient's family history includes Heart failure in his father; Hypertension in his mother.  ROS:   Please see the history of present illness.    All other systems reviewed and are negative.   Prior CV studies:   The following studies were reviewed today: NM Stress Test 08/11/2017  Nuclear stress EF: 42%. Distal apical anteroseptal akinesis.  The left ventricular ejection fraction is moderately decreased (30-44%).  There was no ST segment deviation noted during stress.  Defect 1: There is a medium defect of moderate severity present in the mid anteroseptal and apical anterior location.  Findings consistent with prior myocardial infarction with peri-infarct ischemia. Mild increase in size of area involved when compared to prior study. Prior LAD stent.  This is an intermediate risk study.  Conclusion 12/23/2015    Prox RCA lesion, 100 %stenosed.  Prox Cx lesion, 40 %stenosed.  Dist Cx lesion, 40 %stenosed.  There is mild left ventricular  systolic dysfunction.  LV end diastolic pressure is normal.  The left ventricular ejection fraction is 45-50% by visual estimate.  There is no mitral valve regurgitation.  A drug eluting stent was successfully placed.  Prox LAD lesion, 95 %stenosed.  Post intervention, there is a 0% residual stenosis.  1. Double vessel CAD with unstable angina.  2. Chronic occlusion of the proximal RCA with filling of the mid and distal vessel from left to right collaterals.  3. Severe stenosis proximal LAD 4. Moderate proximal Circumflex stenosis 5. Successful PTCA/DES x 1 proximal LAD 6. Mild LV systolic dysfunction  Recommendations: Will continue ASA, Plavix for one year. Continue beta blocker and statin. Would consider changing to high dose Lipitor at discharge. Smoking cessation     Labs/Other Tests and Data Reviewed:    EKG:  No ECG reviewed.  Recent Labs: 09/14/2018: BUN 7; Creatinine, Ser 0.73;  Hemoglobin 14.6; Platelets 209; Potassium 3.5; Sodium 139   Recent Lipid Panel Lab Results  Component Value Date/Time   CHOL 158 12/18/2015 10:16 AM   TRIG 302 (H) 12/18/2015 10:16 AM   HDL 25 (L) 12/18/2015 10:16 AM   CHOLHDL 6.3 (H) 12/18/2015 10:16 AM   LDLCALC 73 12/18/2015 10:16 AM    Wt Readings from Last 3 Encounters:  08/09/18 171 lb (77.6 kg)  06/18/18 165 lb (74.8 kg)  05/16/18 169 lb (76.7 kg)     Objective:    Vital Signs:  There were no vitals taken for this visit.  He is unable to take his BP at this time.  He is at work. Alert and oriented.  Heavy accent, sometimes hard to understand over the phone.  Able to ask and answer questions appropriately.   ASSESSMENT & PLAN:    1. Hypertension; He states that his BP has been running in the 140's systolic. He has not had any chest pain. He is going to get another BP machine as he states that his is not working properly. He ran out of isosorbide. He is taking all other medications as directed.   2. CAD:  He denies  any new chest pain He continues on secondary prevention with ASA, BB, ACE, and statin therapy.   3. Ongoing tobacco abuse: He has failed two attempts at using Chantix. He did not tolerate the medication. He reports that the patches did not help either. He will try gum. I have reinforced the need to stop smoking. HE verbalized understanding. I am concerned that he will have progression of CAD with continued use. He verbalized understanding.    COVID-19 Education: The signs and symptoms of COVID-19 were discussed with the patient and how to seek care for testing (follow up with PCP or arrange E-visit).  The importance of social distancing was discussed today.  Time:   Today, I have spent 10 minutes with the patient with telehealth technology discussing the above problems.     Medication Adjustments/Labs and Tests Ordered: Current medicines are reviewed at length with the patient today.  Concerns regarding medicines are outlined above.   Tests Ordered: No orders of the defined types were placed in this encounter.   Medication Changes: No orders of the defined types were placed in this encounter.   Disposition:  Follow up 4 months   Signed, Bettey MareKathryn M. Liborio NixonLawrence DNP, ANP, AACC  09/20/2018 4:50 PM      [facility]

## 2018-09-21 ENCOUNTER — Telehealth (INDEPENDENT_AMBULATORY_CARE_PROVIDER_SITE_OTHER): Payer: Medicaid Other | Admitting: Adult Health

## 2018-09-21 DIAGNOSIS — I10 Essential (primary) hypertension: Secondary | ICD-10-CM | POA: Diagnosis not present

## 2018-09-21 DIAGNOSIS — I2510 Atherosclerotic heart disease of native coronary artery without angina pectoris: Secondary | ICD-10-CM

## 2018-09-21 DIAGNOSIS — Z720 Tobacco use: Secondary | ICD-10-CM

## 2018-09-21 DIAGNOSIS — E7800 Pure hypercholesterolemia, unspecified: Secondary | ICD-10-CM

## 2018-09-21 MED ORDER — VARENICLINE TARTRATE 1 MG PO TABS: 1.0000 mg | ORAL_TABLET | Freq: Two times a day (BID) | ORAL | 0 refills | Status: DC

## 2018-09-21 MED ORDER — ISOSORBIDE MONONITRATE ER 30 MG PO TB24: 15.0000 mg | ORAL_TABLET | Freq: Every day | ORAL | 2 refills | Status: DC

## 2018-09-21 NOTE — Patient Instructions (Addendum)
Follow-Up: You will need a follow up appointment in 4 - 6 months.  Please call our office 2 months in advance to schedule this appointment.  You may see Chrystie Nose, MD Joni Reining, DNP, AACC or one of the following Advanced Practice Providers on your designated Care Team:  Azalee Course, PA-C  Micah Flesher, [place]        Medication Instructions:  NO CHANGES- Your physician recommends that you continue on your current medications as directed. Please refer to the Current Medication list given to you today. If you need a refill on your cardiac medications before your next appointment, please call your pharmacy. Labwork: When you have labs (blood work) and your tests are completely normal, you will receive your results ONLY by MyChart Message (if you have MyChart) -OR- A paper copy in the mail.  At [facility], you and your health needs are our priority.  As part of our continuing mission to provide you with exceptional heart care, we have created designated Provider Care Teams.  These Care Teams include your primary Cardiologist (physician) and Advanced Practice Providers (APPs -  Physician Assistants and Nurse Practitioners) who all work together to provide you with the care you need, when you need it.  Thank you for choosing [facility] at [facility]!!

## 2018-11-09 ENCOUNTER — Telehealth: Payer: Self-pay | Admitting: Internal Medicine

## 2018-11-09 NOTE — Telephone Encounter (Signed)
This patient is very anxious about his health and also smokes heavily. I have seen him several times. I think he needs to be seen by his cardiologist for reassurance of his symptoms. He will need to stay hydrated to avoid dizziness. Please make appointment with Dr. Debara Pickett please.

## 2018-11-09 NOTE — Telephone Encounter (Signed)
STAT if patient feels like he/she is going to faint   1) Are you dizzy now? Yes-   2) Do you feel faint or have you passed out?  Sometimes, can not focus  3) Do you have any other symptoms?  Tired and no energy, pain in his right leg   4) Have you checked your HR and BP (record if available)? Everyday- blood pressure and heart rate is good ,

## 2018-11-09 NOTE — Telephone Encounter (Signed)
Spoke with pt who state for the past month he has been experiencing some dizziness, neck/leg pain, and feeling tired. He state he sometimes feel like he is going to die. He also report occasional left sided chest pain but denies symptoms at the moment.[name] He report his BP is WNL and he only notice a decrease when he stay in the shower for about an hour but it only drop to around 110's. Pt states because of his symptoms he did not take his amlodipine and Imdur today.   Will route to PA for recommendations

## 2018-11-09 NOTE — Telephone Encounter (Signed)
Pt updated. Dr. Debara Pickett is this pt ok for virtual visit or would you like him to be seen in office?

## 2018-11-13 DIAGNOSIS — I2510 Atherosclerotic heart disease of native coronary artery without angina pectoris: Secondary | ICD-10-CM | POA: Diagnosis not present

## 2018-11-13 DIAGNOSIS — Z79899 Other long term (current) drug therapy: Secondary | ICD-10-CM | POA: Insufficient documentation

## 2018-11-13 DIAGNOSIS — F17210 Nicotine dependence, cigarettes, uncomplicated: Secondary | ICD-10-CM | POA: Insufficient documentation

## 2018-11-13 DIAGNOSIS — M79602 Pain in left arm: Secondary | ICD-10-CM | POA: Diagnosis present

## 2018-11-13 DIAGNOSIS — Z20828 Contact with and (suspected) exposure to other viral communicable diseases: Secondary | ICD-10-CM | POA: Insufficient documentation

## 2018-11-13 DIAGNOSIS — I10 Essential (primary) hypertension: Secondary | ICD-10-CM | POA: Insufficient documentation

## 2018-11-13 DIAGNOSIS — Z7982 Long term (current) use of aspirin: Secondary | ICD-10-CM | POA: Insufficient documentation

## 2018-11-13 DIAGNOSIS — Z955 Presence of coronary angioplasty implant and graft: Secondary | ICD-10-CM | POA: Insufficient documentation

## 2018-11-14 ENCOUNTER — Emergency Department (HOSPITAL_COMMUNITY): Payer: Medicaid Other

## 2018-11-14 ENCOUNTER — Other Ambulatory Visit: Payer: Self-pay

## 2018-11-14 ENCOUNTER — Encounter (HOSPITAL_COMMUNITY): Payer: Self-pay

## 2018-11-14 ENCOUNTER — Emergency Department (HOSPITAL_COMMUNITY)
Admission: EM | Admit: 2018-11-14 | Discharge: 2018-11-14 | Disposition: A | Discharge status: Home / Self Care | Payer: Medicaid Other | Attending: Emergency Medicine | Admitting: Emergency Medicine

## 2018-11-14 DIAGNOSIS — I10 Essential (primary) hypertension: Secondary | ICD-10-CM | POA: Diagnosis present

## 2018-11-14 DIAGNOSIS — Z720 Tobacco use: Secondary | ICD-10-CM | POA: Diagnosis present

## 2018-11-14 DIAGNOSIS — M79602 Pain in left arm: Secondary | ICD-10-CM

## 2018-11-14 DIAGNOSIS — E7800 Pure hypercholesterolemia, unspecified: Secondary | ICD-10-CM | POA: Diagnosis present

## 2018-11-14 LAB — CBC WITH DIFFERENTIAL/PLATELET
Abs Immature Granulocytes: 0.09 10*3/uL — ABNORMAL HIGH (ref 0.00–0.07)
Basophils Absolute: 0.1 10*3/uL (ref 0.0–0.1)
Basophils Relative: 1 %
Eosinophils Absolute: 0.4 10*3/uL (ref 0.0–0.5)
Eosinophils Relative: 3 %
HCT: 43.4 % (ref 39.0–52.0)
Hemoglobin: 14.6 g/dL (ref 13.0–17.0)
Immature Granulocytes: 1 %
Lymphocytes Relative: 42 %
Lymphs Abs: 5.9 10*3/uL — ABNORMAL HIGH (ref 0.7–4.0)
MCH: 30.3 pg (ref 26.0–34.0)
MCHC: 33.6 g/dL (ref 30.0–36.0)
MCV: 90 fL (ref 80.0–100.0)
Monocytes Absolute: 1.1 10*3/uL — ABNORMAL HIGH (ref 0.1–1.0)
Monocytes Relative: 8 %
Neutro Abs: 6.6 10*3/uL (ref 1.7–7.7)
Neutrophils Relative %: 45 %
Platelets: 231 10*3/uL (ref 150–400)
RBC: 4.82 MIL/uL (ref 4.22–5.81)
RDW: 13.4 % (ref 11.5–15.5)
WBC: 14.2 10*3/uL — ABNORMAL HIGH (ref 4.0–10.5)
nRBC: 0 % (ref 0.0–0.2)

## 2018-11-14 LAB — BASIC METABOLIC PANEL
Anion gap: 11 (ref 5–15)
BUN: 9 mg/dL (ref 6–20)
CO2: 21 mmol/L — ABNORMAL LOW (ref 22–32)
Calcium: 9.4 mg/dL (ref 8.9–10.3)
Chloride: 104 mmol/L (ref 98–111)
Creatinine, Ser: 0.7 mg/dL (ref 0.61–1.24)
GFR calc Af Amer: >60 mL/min (ref >60)
GFR calc non Af Amer: >60 mL/min (ref >60)
Glucose, Bld: 117 mg/dL — ABNORMAL HIGH (ref 70–99)
Potassium: 3.8 mmol/L (ref 3.5–5.1)
Sodium: 136 mmol/L (ref 135–145)

## 2018-11-14 LAB — SARS CORONAVIRUS 2: SARS Coronavirus 2: NOT DETECTED

## 2018-11-14 LAB — TROPONIN I: Troponin I: <0.03 ng/mL (ref <0.03)

## 2018-11-14 MED ORDER — KETOROLAC TROMETHAMINE 15 MG/ML IJ SOLN
15.0000 mg | Freq: Once | INTRAMUSCULAR | Status: AC
  Administered 2018-11-14: 15 mg via INTRAVENOUS
  Filled 2018-11-14: qty 1

## 2018-11-14 MED ORDER — LIDOCAINE VISCOUS HCL 2 % MT SOLN
15.0000 mL | Freq: Once | OROMUCOSAL | Status: AC
  Administered 2018-11-14: 15 mL via ORAL
  Filled 2018-11-14: qty 15

## 2018-11-14 MED ORDER — MORPHINE SULFATE (PF) 2 MG/ML IV SOLN: 2.0000 mg | INTRAVENOUS | Status: DC | PRN

## 2018-11-14 MED ORDER — HYDRALAZINE HCL 20 MG/ML IJ SOLN: 5.0000 mg | INTRAMUSCULAR | Status: DC | PRN

## 2018-11-14 MED ORDER — ALUM & MAG HYDROXIDE-SIMETH 200-200-20 MG/5ML PO SUSP
30.0000 mL | Freq: Once | ORAL | Status: AC
  Administered 2018-11-14: 30 mL via ORAL
  Filled 2018-11-14: qty 30

## 2018-11-14 MED ORDER — DICLOFENAC SODIUM ER 100 MG PO TB24: 100.0000 mg | ORAL_TABLET | Freq: Every day | ORAL | 0 refills | Status: DC

## 2018-11-14 MED ORDER — ASPIRIN 81 MG PO CHEW
324.0000 mg | CHEWABLE_TABLET | Freq: Once | ORAL | Status: AC
  Administered 2018-11-14: 324 mg via ORAL
  Filled 2018-11-14: qty 4

## 2018-11-14 NOTE — Telephone Encounter (Signed)
Ok to schedule him in the office.  Dr. Lemmie Evens

## 2018-11-14 NOTE — ED Provider Notes (Addendum)
[facility] EMERGENCY DEPARTMENT Provider Note   CSN: [id number] Arrival date & time: 11/13/18  2353     History   Chief Complaint Chief Complaint  Patient presents with  . Arm Pain    HPI John Mcintyre is a 48 y.o. male.     The history is provided by the patient.  Arm Pain This is a new problem. The current episode started yesterday. The problem occurs constantly. The problem has not changed since onset.Pertinent negatives include no chest pain, no abdominal pain, no headaches and no shortness of breath. Nothing aggravates the symptoms. Nothing relieves the symptoms. He has tried nothing for the symptoms. The treatment provided no relief.  Patient with known ascvd presents with left arm pain.  No f/c/r.  No cough.  No wheeze.  No DOE no SOB  No chest pain.  No n/v/d.  No diaphoresis.     Past Medical History:  Diagnosis Date  . Coronary artery disease   . Hypercholesteremia   . Hypertension   . Polio     Patient Active Problem List   Diagnosis Date Noted  . Pre-operative cardiovascular examination 09/14/2017  . Pulmonary nodule 09/14/2017  . 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 Left lumbar radiculopathy 08/25/2017  . Neuropathic pain 10/15/2016  . Hypertensive urgency 04/19/2016  . CAD S/P LAD DES 12/23/15 12/23/2015  . Tobacco abuse 12/23/2015  . Essential hypertension 10/11/2014  . Hypercholesteremia 10/11/2014  . Chest pain 10/11/2014  . Polio     Past Surgical History:  Procedure Laterality Date  . CARDIAC CATHETERIZATION N/A 12/23/2015   Procedure: Left Heart Cath and Coronary Angiography;  Surgeon: Kathleene Hazelhristopher D McAlhany, MD;  [facility];  Service: Cardiovascular;  Laterality: N/A;  . CARDIAC CATHETERIZATION N/A 12/23/2015   Procedure: Coronary Stent Intervention;  Surgeon: Kathleene Hazelhristopher D McAlhany, MD;  Location: [facility] INVASIVE CV LAB;  Service: Cardiovascular;  Laterality: N/A;  . CORONARY STENT PLACEMENT  12/23/2015   Successful PTCA/DES x 1 proximal LAD  . HIP SURGERY Left 1987   for polio, not sure what was done  . KNEE SURGERY Left 1987   for polio        Home Medications    Prior to Admission medications   Medication Sig Start Date End Date Taking? Authorizing Provider  amLODipine (NORVASC) 5 MG tablet Take 1 tablet (5 mg total) by mouth daily. 09/15/18 12/14/18  Jodelle GrossLawrence, Kathryn M, NP  aspirin EC 81 MG tablet Take 81 mg by mouth daily.    [provider]  isosorbide mononitrate (IMDUR) 30 MG 24 hr tablet Take 0.5 tablets (15 mg total) by mouth daily. 09/21/18 12/20/18  Jodelle GrossLawrence, Kathryn M, NP  lisinopril (PRINIVIL,ZESTRIL) 20 MG tablet TAKE 1 TABLET BY MOUTH EVERY DAY 08/29/18   Hilty, Lisette AbuKenneth C, MD  lovastatin (MEVACOR) 20 MG tablet TAKE 1 TABLET (20 MG TOTAL) BY MOUTH AT BEDTIME. Patient taking differently: Take 20 mg by mouth at bedtime.  01/28/18   Hilty, Lisette AbuKenneth C, MD  methocarbamol (ROBAXIN) 500 MG tablet Take 1 tablet (500 mg total) by mouth 2 (two) times daily. 09/14/18   McDonald, Mia A, PA-C  metoprolol tartrate (LOPRESSOR) 50 MG tablet Take 1 tablet (50 mg total) by mouth 2 (two) times daily. 50MG  IN THE AM AND 25MG  (1/2 TAB) IN THE PM Patient taking differently: Take 50 mg by mouth 2 (two) times daily. 100MG  IN THE AM AND 50MG   IN THE PM 05/16/18   Jodelle GrossLawrence, Kathryn M, NP  nitroGLYCERIN (NITROSTAT) 0.4 MG SL tablet Place 1 tablet (0.4 mg total) under the tongue every 5 (five) minutes as needed for chest pain. 07/26/18   Hilty, Lisette AbuKenneth C, MD  ondansetron (ZOFRAN ODT) 4 MG disintegrating tablet Take 1 tablet (4 mg total) by mouth every 8 (eight) hours as needed for nausea or vomiting. 06/18/18   Elpidio AnisUpstill, Shari, PA-C  varenicline (CHANTIX CONTINUING MONTH PAK) 1 MG tablet Take 1 tablet (1 mg total) by mouth 2 (two) times daily. 09/21/18   Jodelle GrossLawrence, Kathryn M, NP    Family History Family History  Problem Relation Age of Onset  . Hypertension Mother   . Heart failure Father      Social History Social History   Tobacco Use  . Smoking status: Current Every Day Smoker    Packs/day: 1.00    Types: Cigarettes  . Smokeless tobacco: Never Used  . Tobacco comment: ultra lights  Substance Use Topics  . Alcohol use: Yes    Comment: occ  . Drug use: No     Allergies   Poractant alfa, Pork-derived products, and Tape   Review of Systems Review of Systems  Constitutional: Negative for diaphoresis and fever.  Respiratory: Negative for cough and shortness of breath.   Cardiovascular: Negative for chest pain, palpitations and leg swelling.  Gastrointestinal: Negative for abdominal pain.  Musculoskeletal: Positive for arthralgias.  Neurological: Negative for headaches.  All other systems reviewed and are negative.    Physical Exam Updated Vital Signs BP 130/90   Pulse 85   Temp 97.8 F (36.6 C)   Resp 19   SpO2 100%   Physical Exam Vitals signs and nursing note reviewed.  Constitutional:      General: He is not in acute distress.    Appearance: He is normal weight. He is not diaphoretic.  HENT:     Head: Normocephalic and atraumatic.     Nose: Nose normal.  Eyes:     Conjunctiva/sclera: Conjunctivae normal.     Pupils: Pupils are equal, round, and reactive to light.  Neck:     Musculoskeletal: Normal range of motion and neck supple.  Cardiovascular:     Rate and Rhythm: Normal rate and regular rhythm.     Pulses: Normal pulses.     Heart sounds: Normal heart sounds.  Pulmonary:     Effort: Pulmonary effort is normal.     Breath sounds: Normal breath sounds.  Abdominal:     General: Abdomen is flat. Bowel sounds are normal.     Tenderness: There is no abdominal tenderness. There is no guarding or rebound.  Musculoskeletal: Normal range of motion.  Skin:    General: Skin is warm and dry.     Capillary Refill: Capillary refill takes less than 2 seconds.  Neurological:     General: No focal deficit present.     Mental Status: He is alert and  oriented to person, place, and time.  Psychiatric:        Mood and Affect: Mood normal.      ED Treatments / Results  Labs (all labs ordered are listed, but only abnormal results are displayed) Labs Reviewed  CBC WITH DIFFERENTIAL/PLATELET  BASIC METABOLIC PANEL  TROPONIN I    EKG EKG Interpretation  Date/Time:  Monday November 14 2018 00:01:29 EDT Ventricular Rate:  82 PR Interval:  180 QRS Duration: 72 QT Interval:  360 QTC Calculation: 420 R Axis:   50 Text Interpretation:  Normal sinus rhythm Septal infarct ,  age undetermined Confirmed by Veatrice Kells [phone]) on 11/14/2018 12:13:21 AM   Radiology Dg Chest Portable 1 View  Result Date: 11/14/2018 CLINICAL DATA:  Pain in left arm. EXAM: PORTABLE CHEST 1 VIEW COMPARISON:  09/14/2018 FINDINGS: Heart and mediastinal contours are within normal limits. No focal opacities or effusions. No acute bony abnormality. IMPRESSION: No active disease. Electronically Signed   By: Rolm Baptise M.D.   On: 11/14/2018 00:27    Procedures Procedures (including critical care time)  Medications Ordered in ED Medications  ketorolac (TORADOL) 15 MG/ML injection 15 mg (has no administration in time range)  alum & mag hydroxide-simeth (MAALOX/MYLANTA) 200-200-20 MG/5ML suspension 30 mL (has no administration in time range)    And  lidocaine (XYLOCAINE) 2 % viscous mouth solution 15 mL (has no administration in time range)     No chest pain no exertional symptoms.  Only arm pain.  Exam is benign and reassuring troponin is negative.    Final Clinical Impressions(s) / ED Diagnoses   Return for intractable cough, coughing up blood,fevers >100.4 unrelieved by medication, shortness of breath, intractable vomiting, chest pain, shortness of breath, weakness,numbness, changes in speech, facial asymmetry,abdominal pain, passing out,Inability to tolerate liquids or food, cough, altered mental status or any concerns. No signs of systemic illness  or infection. The patient is nontoxic-appearing on exam and vital signs are within normal limits.   I have reviewed the triage vital signs and the nursing notes. Pertinent labs &imaging results that were available during my care of the patient were reviewed by me and considered in my medical decision making (see chart for details).  After history, exam, and medical workup I feel the patient has been appropriately medically screened and is safe for discharge home. Pertinent diagnoses were discussed with the patient. Patient was given return precautions   Anael Rosch, MD 11/14/18 [id number]

## 2018-11-14 NOTE — ED Notes (Signed)
ED TO INPATIENT HANDOFF REPORT  ED Nurse Name and Phone #: [phone]  S Name/Age/Gender John Mcintyre 48 y.o. male Room/Bed: 029C/029C  Code Status Full code  Home/SNF/Other Dc home AO x 4 Arabic speaker   Triage Complete: Triage complete  Chief Complaint vision is blurred. Left arm pain. pressure irregular. fatigue.   Triage Note Pt states that for the past four weeks he has the ongoing problem of L arm pain, that radaites to his back and under L arm, heart cath 3 years ago with several blockages, also having dizziness and vision problems.    Allergies Allergies  Allergen Reactions  . Poractant Alfa Other (See Comments)    Patient does not eat pork because of his culture  . Pork-Derived Products Other (See Comments)    Patient does not eat pork because of his culture  . Tape Other (See Comments)    Skin is sensitive    Level of Care/Admitting Diagnosis ED Disposition    ED Disposition Condition Comment   Admit  The patient appears reasonably stabilized for admission considering the current resources, flow, and capabilities available in the ED at this time, and I doubt any other [facility] further screening and/or treatment in the ED prior to admission is  present.       B Medical/Surgery History Past Medical History:  Diagnosis Date  . Coronary artery disease   . Hypercholesteremia   . Hypertension   . Polio    Past Surgical History:  Procedure Laterality Date  . CARDIAC CATHETERIZATION N/A 12/23/2015   Procedure: Left Heart Cath and Coronary Angiography;  Surgeon: Kathleene Hazelhristopher D McAlhany, MD;  [facility];  Service: Cardiovascular;  Laterality: N/A;  . CARDIAC CATHETERIZATION N/A 12/23/2015   Procedure: Coronary Stent Intervention;  Surgeon: Kathleene Hazelhristopher D McAlhany, MD;  Location: [facility] INVASIVE CV LAB;  Service: Cardiovascular;  Laterality: N/A;  . CORONARY STENT PLACEMENT  12/23/2015   Successful PTCA/DES x 1 proximal LAD  . HIP SURGERY  Left 1987   for polio, not sure what was done  . KNEE SURGERY Left 1987   for polio     A IV Location/Drains/Wounds Patient Lines/Drains/Airways Status   Active Line/Drains/Airways    Name:   Placement date:   Placement time:   Site:   Days:   Peripheral IV 11/14/18 Left Antecubital   11/14/18    0021    Antecubital   less than 1          Intake/Output Last 24 hours No intake or output data in the 24 hours ending 11/14/18 0231  Labs/Imaging Results for orders placed or performed during the hospital encounter of 11/14/18 (from the past 48 hour(s))  CBC with Differential/Platelet     Status: Abnormal   Collection Time: 11/14/18 12:18 AM  Result Value Ref Range   WBC 14.2 (H) 4.0 - 10.5 K/uL   RBC 4.82 4.22 - 5.81 MIL/uL   Hemoglobin 14.6 13.0 - 17.0 g/dL   HCT 45.443.4 09.839.0 - 11.952.0 %   MCV 90.0 80.0 - 100.0 fL   MCH 30.3 26.0 - 34.0 pg   MCHC 33.6 30.0 - 36.0 g/dL   RDW 14.713.4 82.911.5 - 56.215.5 %   Platelets 231 150 - 400 K/uL   nRBC 0.0 0.0 - 0.2 %   Neutrophils Relative % 45 %   Neutro Abs 6.6 1.7 - 7.7 K/uL   Lymphocytes Relative 42 %   Lymphs Abs 5.9 (H) 0.7 - 4.0 K/uL  Monocytes Relative 8 %   Monocytes Absolute 1.1 (H) 0.1 - 1.0 K/uL   Eosinophils Relative 3 %   Eosinophils Absolute 0.4 0.0 - 0.5 K/uL   Basophils Relative 1 %   Basophils Absolute 0.1 0.0 - 0.1 K/uL   Immature Granulocytes 1 %   Abs Immature Granulocytes 0.09 (H) 0.00 - 0.07 K/uL    Comment: Performed at [facility] [address], [place], [place] [address]  Basic metabolic panel     Status: Abnormal   Collection Time: 11/14/18 12:18 AM  Result Value Ref Range   Sodium 136 135 - 145 mmol/L   Potassium 3.8 3.5 - 5.1 mmol/L   Chloride 104 98 - 111 mmol/L   CO2 21 (L) 22 - 32 mmol/L   Glucose, Bld 117 (H) 70 - 99 mg/dL   BUN 9 6 - 20 mg/dL   Creatinine, Ser 0.70 0.61 - 1.24 mg/dL   Calcium 9.4 8.9 - 10.3 mg/dL   GFR calc non Af Amer >60 >60 mL/min   GFR calc Af Amer >60 >60 mL/min   Anion gap  11 5 - 15    Comment: Performed at [facility], [place] [address], [place], [place] [address]  Troponin I - ONCE - STAT     Status: None   Collection Time: 11/14/18 12:18 AM  Result Value Ref Range   Troponin I <0.03 <0.03 ng/mL    Comment: Performed at [facility] [address] [place], [place] [address]   Dg Chest Portable 1 View  Result Date: 11/14/2018 CLINICAL DATA:  Pain in left arm. EXAM: PORTABLE CHEST 1 VIEW COMPARISON:  09/14/2018 FINDINGS: Heart and mediastinal contours are within normal limits. No focal opacities or effusions. No acute bony abnormality. IMPRESSION: No active disease. Electronically Signed   By: Rolm Baptise M.D.   On: 11/14/2018 00:27    Pending Labs Unresulted Labs (From admission, onward)    Start     Ordered   11/14/18 0200  SARS Coronavirus 2  Once,   R     11/14/18 0200   Signed and Held  Rapid urine drug screen (hospital performed)  Once,   R     Signed and Held   Signed and Held  Hemoglobin A1c  Tomorrow morning,   R     Signed and Held   Signed and Held  Lipid panel  Tomorrow morning,   R    Comments: Please obtain as a fasting lipid panel - should not have eaten/ drank food for 8 hours prior to labs.    Signed and Held   Signed and Held  Troponin I - Now Then Q6H  Now then every 6 hours,   R     Signed and Held          Vitals/Pain Today's Vitals   11/13/18 2359 11/14/18 0008 11/14/18 0030 11/14/18 0200  BP: 130/90  120/89 130/85  Pulse: 85  84   Resp: 19  20   Temp: 97.8 F (36.6 C)     SpO2: 100%  98%   PainSc:  2       Isolation Precautions No active isolations  Medications Medications  morphine 2 MG/ML injection 2 mg (has no administration in time range)  hydrALAZINE (APRESOLINE) injection 5 mg (has no administration in time range)  ketorolac (TORADOL) 15 MG/ML injection 15 mg (15 mg Intravenous Given 11/14/18 0059)  alum & mag hydroxide-simeth (MAALOX/MYLANTA) 200-200-20 MG/5ML suspension  30 mL (30 mLs Oral  Given 11/14/18 0059)    And  lidocaine (XYLOCAINE) 2 % viscous mouth solution 15 mL (15 mLs Oral Given 11/14/18 0059)  aspirin chewable tablet 324 mg (324 mg Oral Given 11/14/18 0221)    [employer] Assistance  Low fall risk   Focused Assessments Pt AO x 4 c/o left arm pain radiation to back and neck for the past 2 days, SR on monitor, lungs clear.no skin problems. Pt ambulates with crutches.   R Recommendations: See Admitting Provider Note  Report given to:   Additional Notes:

## 2018-11-14 NOTE — ED Triage Notes (Signed)
Pt states that for the past four weeks he has the ongoing problem of L arm pain, that radaites to his back and under L arm, heart cath 3 years ago with several blockages, also having dizziness and vision problems.

## 2018-11-17 NOTE — Telephone Encounter (Signed)
Spoke with patient and he is agreeable to in office APP appointment. Scheduled for 11/21/18 @ 3:15pm with A. Duke PA

## 2018-11-17 NOTE — Telephone Encounter (Signed)
MD's nurse will arrange f/u appointment.

## 2018-11-21 ENCOUNTER — Ambulatory Visit: Payer: Medicaid Other | Admitting: Physician Assistant

## 2018-11-21 NOTE — Progress Notes (Deleted)
Cardiology Clinic Note   Patient Name: John Mcintyre Date of Encounter: 11/21/2018  Primary Care Provider:  Burnis MedinFulbright, Virginia E, [place]-C Primary Cardiologist:  Chrystie NoseKenneth C Hilty, MD  Patient Profile    Follow-up for Hypertension   Past Medical History    Past Medical History:  Diagnosis Date   Coronary artery disease    Hypercholesteremia    Hypertension    Polio    EKG: 11/14/2018 Normal sinus rhythm 82 bpm.  Myocardial perfusion study: 08/11/2017   Nuclear stress EF: 42%. Distal apical anteroseptal akinesis.  The left ventricular ejection fraction is moderately decreased (30-44%).  There was no ST segment deviation noted during stress.  Defect 1: There is a medium defect of moderate severity present in the mid anteroseptal and apical anterior location.  Findings consistent with prior myocardial infarction with peri-infarct ischemia. Mild increase in size of area involved when compared to prior study. Prior LAD stent.  This is an intermediate risk study.  Cardiac catheterization: 12/23/2015  Prox RCA lesion, 100 %stenosed.  Prox Cx lesion, 40 %stenosed.  Dist Cx lesion, 40 %stenosed.  There is mild left ventricular systolic dysfunction.  LV end diastolic pressure is normal.  The left ventricular ejection fraction is 45-50% by visual estimate.  There is no mitral valve regurgitation.  A drug eluting stent was successfully placed.  Prox LAD lesion, 95 %stenosed.  Post intervention, there is a 0% residual stenosis.   1. Double vessel CAD with unstable angina.  2. Chronic occlusion of the proximal RCA with filling of the mid and distal vessel from left to right collaterals.  3. Severe stenosis proximal LAD 4. Moderate proximal Circumflex stenosis 5. Successful PTCA/DES x 1 proximal LAD 6. Mild LV systolic dysfunction Past Surgical History:  Procedure Laterality Date   CARDIAC CATHETERIZATION N/A 12/23/2015   Procedure: Left Heart Cath and Coronary  Angiography;  Surgeon: Kathleene Hazelhristopher D McAlhany, MD;  [facility];  Service: Cardiovascular;  Laterality: N/A;   CARDIAC CATHETERIZATION N/A 12/23/2015   Procedure: Coronary Stent Intervention;  Surgeon: Kathleene Hazelhristopher D McAlhany, MD;  [facility];  Service: Cardiovascular;  Laterality: N/A;   CORONARY STENT PLACEMENT  12/23/2015   Successful PTCA/DES x 1 proximal LAD   HIP SURGERY Left 1987   for polio, not sure what was done   KNEE SURGERY Left 1987   for polio    Allergies  Allergies  Allergen Reactions   Poractant Alfa Other (See Comments)    Patient does not eat pork because of his culture   Pork-Derived Products Other (See Comments)    Patient does not eat pork because of his culture   Tape Other (See Comments)    Skin is sensitive    History of Present Illness    John Mcintyre is a 48 y.o. male who is being seen today for a follow up evaluation of his hypertension.   PMH of hypertension, hypercholesterolemia, echo abuse, neuropathic pain, pulmonary nodule, polio, and CAD, his last catheterization was on 12/23/2015 revealed CTO of the RCA with right to left collaterals from the circumflex, and a 40% circumflex and 95% proximal LAD received a DES.  His LVEF at that time was 45-50%.   During his 08/09/2018 visit he expressed a desire to stop smoking.  He was prescribed Chantix which she did not tolerate well.  09/14/2018 he presented to the emergency department with left-sided chest pain and rib pain that has been present for 4 days.  On multiple  occasions he presented to the emergency department with chest pain.  He also complained of mild dizziness, he has been prescribed bifocals does not like to wear them.  ACS was ruled out and he was diagnosed with musculoskeletal pain versus postpolio syndrome versus unintentionally placing more weight on left side of his chest due to his leg being slightly longer in the setting polio.  He was prescribed  Robaxin and recommended stretching exercises.  Call the cardiology office on 09/15/2018 with elevated blood pressure.  During that time he was started on amlodipine 5 mg daily for hypertension.  He was instructed to record his blood pressure daily, limit his caffeine intake and to continue smoking cessation.  Today presents to the office and states  1.  Hypertension- well-controlled  2.  Coronary artery disease- no chest pain 3.  Tobacco abuse- continues to try to quit    Home Medications    Prior to Admission medications   Medication Sig Start Date End Date Taking? Authorizing Provider  amLODipine (NORVASC) 5 MG tablet Take 1 tablet (5 mg total) by mouth daily. 09/15/18 12/14/18  Jodelle GrossLawrence, Kathryn M, NP  aspirin EC 81 MG tablet Take 81 mg by mouth daily.    [provider]  Diclofenac Sodium CR 100 MG 24 hr tablet Take 1 tablet (100 mg total) by mouth daily. 11/14/18   Palumbo, April, MD  isosorbide mononitrate (IMDUR) 30 MG 24 hr tablet Take 0.5 tablets (15 mg total) by mouth daily. 09/21/18 12/20/18  Jodelle GrossLawrence, Kathryn M, NP  lisinopril (PRINIVIL,ZESTRIL) 20 MG tablet TAKE 1 TABLET BY MOUTH EVERY DAY 08/29/18   Hilty, Lisette AbuKenneth C, MD  lovastatin (MEVACOR) 20 MG tablet TAKE 1 TABLET (20 MG TOTAL) BY MOUTH AT BEDTIME. Patient taking differently: Take 20 mg by mouth at bedtime.  01/28/18   Hilty, Lisette AbuKenneth C, MD  methocarbamol (ROBAXIN) 500 MG tablet Take 1 tablet (500 mg total) by mouth 2 (two) times daily. 09/14/18   McDonald, Mia A, PA-C  metoprolol tartrate (LOPRESSOR) 50 MG tablet Take 1 tablet (50 mg total) by mouth 2 (two) times daily. 50MG  IN THE AM AND 25MG  (1/2 TAB) IN THE PM Patient taking differently: Take 50 mg by mouth 2 (two) times daily. 100MG  IN THE AM AND 50MG   IN THE PM 05/16/18   Jodelle GrossLawrence, Kathryn M, NP  nitroGLYCERIN (NITROSTAT) 0.4 MG SL tablet Place 1 tablet (0.4 mg total) under the tongue every 5 (five) minutes as needed for chest pain. 07/26/18   Hilty, Lisette AbuKenneth C, MD   ondansetron (ZOFRAN ODT) 4 MG disintegrating tablet Take 1 tablet (4 mg total) by mouth every 8 (eight) hours as needed for nausea or vomiting. 06/18/18   Elpidio AnisUpstill, Shari, PA-C  varenicline (CHANTIX CONTINUING MONTH PAK) 1 MG tablet Take 1 tablet (1 mg total) by mouth 2 (two) times daily. 09/21/18   Jodelle GrossLawrence, Kathryn M, NP    Family History    Family History  Problem Relation Age of Onset   Hypertension Mother    Heart failure Father    He indicated that his mother is alive. He indicated that his father is deceased. He indicated that his maternal grandmother is deceased. He indicated that his maternal grandfather is deceased. He indicated that his paternal grandmother is deceased. He indicated that his paternal grandfather is deceased.  Social History    Social History   Socioeconomic History   Marital status: Married    Spouse name: Not on file   Number of children: 3  Years of education: 2 years of college   Highest education level: Not on file  Occupational History   Occupation: Taxi Solicitor strain: Not on file   Food insecurity    Worry: Not on file    Inability: Not on file   Transportation needs    Medical: Not on file    Non-medical: Not on file  Tobacco Use   Smoking status: Current Every Day Smoker    Packs/day: 1.00    Types: Cigarettes   Smokeless tobacco: Never Used   Tobacco comment: ultra lights  Substance and Sexual Activity   Alcohol use: Yes    Comment: occ   Drug use: No   Sexual activity: Not on file  Lifestyle   Physical activity    Days per week: Not on file    Minutes per session: Not on file   Stress: Not on file  Relationships   Social connections    Talks on phone: Not on file    Gets together: Not on file    Attends religious service: Not on file    Active member of club or organization: Not on file    Attends meetings of clubs or organizations: Not on file    Relationship status:  Not on file   Intimate partner violence    Fear of current or ex partner: Not on file    Emotionally abused: Not on file    Physically abused: Not on file    Forced sexual activity: Not on file  Other Topics Concern   Not on file  Social History Narrative   Lives at home with wife and three children.   Right-handed.   5 cups tea per day.     Review of Systems    General:  No chills, fever, night sweats or weight changes.  Cardiovascular:  No chest pain, dyspnea on exertion, edema, orthopnea, palpitations, paroxysmal nocturnal dyspnea. Dermatological: No rash, lesions/masses Respiratory: No cough, dyspnea Urologic: No hematuria, dysuria Abdominal:   No nausea, vomiting, diarrhea, bright red blood per rectum, melena, or hematemesis Neurologic:  No visual changes, wkns, changes in mental status. All other systems reviewed and are otherwise negative except as noted above.  Physical Exam    VS:  There were no vitals taken for this visit. , BMI There is no height or weight on file to calculate BMI. GEN: Well nourished, well developed, in no acute distress. HEENT: normal. Neck: Supple, no JVD, carotid bruits, or masses. Cardiac: RRR, no murmurs, rubs, or gallops. No clubbing, cyanosis, edema.  Radials/DP/PT 2+ and equal bilaterally.  Respiratory:  Respirations regular and unlabored, clear to auscultation bilaterally. GI: Soft, nontender, nondistended, BS + x 4. MS: no deformity or atrophy. Skin: warm and dry, no rash. Neuro:  Strength and sensation are intact. Psych: Normal affect.  Accessory Clinical Findings    ECG personally reviewed by me today- *** - No acute changes  Assessment & Plan   1.  ***  Deberah Pelton, NP 11/21/2018, 7:33 AM

## 2018-12-12 ENCOUNTER — Ambulatory Visit: Payer: Medicaid Other | Admitting: Physician Assistant

## 2018-12-22 ENCOUNTER — Other Ambulatory Visit: Payer: Self-pay

## 2018-12-22 MED ORDER — METOPROLOL TARTRATE 50 MG PO TABS: 50.0000 mg | ORAL_TABLET | Freq: Two times a day (BID) | ORAL | 6 refills | Status: DC

## 2018-12-24 ENCOUNTER — Other Ambulatory Visit: Payer: Self-pay

## 2018-12-24 ENCOUNTER — Emergency Department (HOSPITAL_COMMUNITY)
Admission: EM | Admit: 2018-12-24 | Discharge: 2018-12-24 | Disposition: A | Discharge status: Home / Self Care | Payer: Medicaid Other | Attending: Emergency Medicine | Admitting: Emergency Medicine

## 2018-12-24 ENCOUNTER — Emergency Department (HOSPITAL_COMMUNITY): Payer: Medicaid Other

## 2018-12-24 ENCOUNTER — Encounter (HOSPITAL_COMMUNITY): Payer: Self-pay | Admitting: Emergency Medicine

## 2018-12-24 DIAGNOSIS — F17210 Nicotine dependence, cigarettes, uncomplicated: Secondary | ICD-10-CM | POA: Insufficient documentation

## 2018-12-24 DIAGNOSIS — Z955 Presence of coronary angioplasty implant and graft: Secondary | ICD-10-CM | POA: Insufficient documentation

## 2018-12-24 DIAGNOSIS — I259 Chronic ischemic heart disease, unspecified: Secondary | ICD-10-CM | POA: Insufficient documentation

## 2018-12-24 DIAGNOSIS — Z7982 Long term (current) use of aspirin: Secondary | ICD-10-CM | POA: Diagnosis not present

## 2018-12-24 DIAGNOSIS — R0789 Other chest pain: Secondary | ICD-10-CM | POA: Diagnosis not present

## 2018-12-24 DIAGNOSIS — M79602 Pain in left arm: Secondary | ICD-10-CM | POA: Insufficient documentation

## 2018-12-24 DIAGNOSIS — Z79899 Other long term (current) drug therapy: Secondary | ICD-10-CM | POA: Insufficient documentation

## 2018-12-24 DIAGNOSIS — R079 Chest pain, unspecified: Secondary | ICD-10-CM

## 2018-12-24 DIAGNOSIS — I10 Essential (primary) hypertension: Secondary | ICD-10-CM | POA: Insufficient documentation

## 2018-12-24 LAB — CBC WITH DIFFERENTIAL/PLATELET
Abs Immature Granulocytes: 0.09 10*3/uL — ABNORMAL HIGH (ref 0.00–0.07)
Basophils Absolute: 0.1 10*3/uL (ref 0.0–0.1)
Basophils Relative: 1 %
Eosinophils Absolute: 0.4 10*3/uL (ref 0.0–0.5)
Eosinophils Relative: 4 %
HCT: 40.5 % (ref 39.0–52.0)
Hemoglobin: 13.8 g/dL (ref 13.0–17.0)
Immature Granulocytes: 1 %
Lymphocytes Relative: 39 %
Lymphs Abs: 4.4 10*3/uL — ABNORMAL HIGH (ref 0.7–4.0)
MCH: 30.5 pg (ref 26.0–34.0)
MCHC: 34.1 g/dL (ref 30.0–36.0)
MCV: 89.6 fL (ref 80.0–100.0)
Monocytes Absolute: 0.8 10*3/uL (ref 0.1–1.0)
Monocytes Relative: 7 %
Neutro Abs: 5.6 10*3/uL (ref 1.7–7.7)
Neutrophils Relative %: 48 %
Platelets: 235 10*3/uL (ref 150–400)
RBC: 4.52 MIL/uL (ref 4.22–5.81)
RDW: 12.8 % (ref 11.5–15.5)
WBC: 11.4 10*3/uL — ABNORMAL HIGH (ref 4.0–10.5)
nRBC: 0 % (ref 0.0–0.2)

## 2018-12-24 LAB — BASIC METABOLIC PANEL
Anion gap: 9 (ref 5–15)
BUN: 7 mg/dL (ref 6–20)
CO2: 22 mmol/L (ref 22–32)
Calcium: 9.2 mg/dL (ref 8.9–10.3)
Chloride: 107 mmol/L (ref 98–111)
Creatinine, Ser: 0.63 mg/dL (ref 0.61–1.24)
GFR calc Af Amer: >60 mL/min (ref >60)
GFR calc non Af Amer: >60 mL/min (ref >60)
Glucose, Bld: 89 mg/dL (ref 70–99)
Potassium: 3.8 mmol/L (ref 3.5–5.1)
Sodium: 138 mmol/L (ref 135–145)

## 2018-12-24 LAB — TROPONIN I (HIGH SENSITIVITY)
Troponin I (High Sensitivity): 3 ng/L (ref <18)
Troponin I (High Sensitivity): 3 ng/L (ref <18)

## 2018-12-24 LAB — D-DIMER, QUANTITATIVE: D-Dimer, Quant: <0.27 ug/mL-FEU (ref 0.00–0.50)

## 2018-12-24 MED ORDER — MORPHINE SULFATE (PF) 4 MG/ML IV SOLN
4.0000 mg | Freq: Once | INTRAVENOUS | Status: AC
  Administered 2018-12-24: 4 mg via INTRAVENOUS
  Filled 2018-12-24 (×2): qty 1

## 2018-12-24 NOTE — Discharge Instructions (Signed)
If you develop recurrent, continued, or worsening chest pain, shortness of breath, fever, vomiting, abdominal or back pain, or any other new/concerning symptoms then return to the ER for evaluation.  

## 2018-12-24 NOTE — ED Notes (Signed)
Patient verbalizes understanding of discharge instructions. Opportunity for questioning and answers were provided. Armband removed by staff, pt discharged from ED.  

## 2018-12-24 NOTE — ED Triage Notes (Addendum)
Pt complains of left sided arm pain, CP and back pain for 3 days. Pt had a heart cath 3 years ago. Pt isn't sure if it is his heart or muscle related.

## 2018-12-24 NOTE — ED Provider Notes (Addendum)
[facility] Provider Note   CSN: [id number] Arrival date & time: 12/24/18  1813    History   Chief Complaint No chief complaint on file.   HPI John Mcintyre is a 48 y.o. male.     HPI  48 year old male presents with waxing and waning chest pain and left arm pain.  Started about 5 days ago.  Feels like a burning in his chest and aching in his left hand/wrist.  He has been using crutches because of his polio.  Moving his arm, twisting his chest or extending his back seems to make the pain worse.  No cough or shortness of breath.  Took some ibuprofen with partial relief earlier this morning and has tried Tylenol.  This feels different than his cardiac chest pain.  Currently is about a 5 out of 10.  Past Medical History:  Diagnosis Date  . Coronary artery disease   . Hypercholesteremia   . Hypertension   . Polio     Patient Active Problem List   Diagnosis Date Noted  . Pre-operative cardiovascular examination 09/14/2017  . Pulmonary nodule 09/14/2017  . 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 Left lumbar radiculopathy 08/25/2017  . Neuropathic pain 10/15/2016  . Hypertensive urgency 04/19/2016  . CAD S/P LAD DES 12/23/15 12/23/2015  . Tobacco abuse 12/23/2015  . Essential hypertension 10/11/2014  . Hypercholesteremia 10/11/2014  . Chest pain 10/11/2014  . Polio     Past Surgical History:  Procedure Laterality Date  . CARDIAC CATHETERIZATION N/A 12/23/2015   Procedure: Left Heart Cath and Coronary Angiography;  Surgeon: Kathleene Hazelhristopher D McAlhany, MD;  [facility] INV[facility];  Service: Cardiovascular;  Laterality: N/A;  . CARDIAC CATHETERIZATION N/A 12/23/2015   Procedure: Coronary Stent Intervention;  Surgeon: Kathleene Hazelhristopher D McAlhany, MD;  Location: [facility] INVASIVE CV LAB;  Service: Cardiovascular;  Laterality: N/A;  . CORONARY STENT PLACEMENT  12/23/2015   Successful PTCA/DES x 1 proximal LAD  . HIP SURGERY Left 1987   for polio, not sure  what was done  . KNEE SURGERY Left 1987   for polio        Home Medications    Prior to Admission medications   Medication Sig Start Date End Date Taking? Authorizing Provider  amLODipine (NORVASC) 5 MG tablet Take 1 tablet (5 mg total) by mouth daily. 09/15/18 12/14/18  Jodelle GrossLawrence, Kathryn M, NP  aspirin EC 81 MG tablet Take 81 mg by mouth daily.    [provider]  Diclofenac Sodium CR 100 MG 24 hr tablet Take 1 tablet (100 mg total) by mouth daily. 11/14/18   Palumbo, April, MD  isosorbide mononitrate (IMDUR) 30 MG 24 hr tablet Take 0.5 tablets (15 mg total) by mouth daily. 09/21/18 12/20/18  Jodelle GrossLawrence, Kathryn M, NP  lisinopril (PRINIVIL,ZESTRIL) 20 MG tablet TAKE 1 TABLET BY MOUTH EVERY DAY 08/29/18   Hilty, Lisette AbuKenneth C, MD  lovastatin (MEVACOR) 20 MG tablet TAKE 1 TABLET (20 MG TOTAL) BY MOUTH AT BEDTIME. Patient taking differently: Take 20 mg by mouth at bedtime.  01/28/18   Hilty, Lisette AbuKenneth C, MD  methocarbamol (ROBAXIN) 500 MG tablet Take 1 tablet (500 mg total) by mouth 2 (two) times daily. 09/14/18   McDonald, Mia A, PA-C  metoprolol tartrate (LOPRESSOR) 50 MG tablet Take 1 tablet (50 mg total) by mouth 2 (two) times daily. 50MG  IN THE AM AND 25MG  (1/2 TAB) IN THE PM 12/22/18   Jodelle GrossLawrence, Kathryn M, NP  nitroGLYCERIN (NITROSTAT) 0.4 MG SL tablet  Place 1 tablet (0.4 mg total) under the tongue every 5 (five) minutes as needed for chest pain. 07/26/18   Hilty, Lisette AbuKenneth C, MD  ondansetron (ZOFRAN ODT) 4 MG disintegrating tablet Take 1 tablet (4 mg total) by mouth every 8 (eight) hours as needed for nausea or vomiting. 06/18/18   Elpidio AnisUpstill, Shari, PA-C  varenicline (CHANTIX CONTINUING MONTH PAK) 1 MG tablet Take 1 tablet (1 mg total) by mouth 2 (two) times daily. 09/21/18   Jodelle GrossLawrence, Kathryn M, NP    Family History Family History  Problem Relation Age of Onset  . Hypertension Mother   . Heart failure Father     Social History Social History   Tobacco Use  . Smoking status: Current Every  Day Smoker    Packs/day: 1.00    Types: Cigarettes  . Smokeless tobacco: Never Used  . Tobacco comment: ultra lights  Substance Use Topics  . Alcohol use: Yes    Comment: occ  . Drug use: No     Allergies   Poractant alfa, Pork-derived products, and Tape   Review of Systems Review of Systems  Constitutional: Negative for fever.  Respiratory: Negative for shortness of breath.   Cardiovascular: Positive for chest pain.  Gastrointestinal: Negative for abdominal pain and vomiting.  Musculoskeletal: Positive for arthralgias and back pain.  All other systems reviewed and are negative.    Physical Exam Updated Vital Signs BP (!) 143/59 (BP Location: Right Arm)   Pulse 64   Temp 97.8 F (36.6 C) (Oral)   Resp 18   Ht 5\' 5"  (1.651 m)   Wt 74.8 kg   SpO2 96%   BMI 27.46 kg/m   Physical Exam Vitals signs and nursing note reviewed.  Constitutional:      General: He is not in acute distress.    Appearance: He is well-developed. He is not ill-appearing or diaphoretic.  HENT:     Head: Normocephalic and atraumatic.     Right Ear: External ear normal.     Left Ear: External ear normal.     Nose: Nose normal.  Eyes:     General:        Right eye: No discharge.        Left eye: No discharge.  Neck:     Musculoskeletal: Neck supple.  Cardiovascular:     Rate and Rhythm: Normal rate and regular rhythm.     Pulses:          Radial pulses are 2+ on the left side.     Heart sounds: Normal heart sounds.  Pulmonary:     Effort: Pulmonary effort is normal.     Breath sounds: Normal breath sounds.  Abdominal:     Palpations: Abdomen is soft.     Tenderness: There is no abdominal tenderness.  Musculoskeletal:     Left shoulder: He exhibits normal range of motion and no tenderness.     Left wrist: He exhibits tenderness (mild). He exhibits normal range of motion.     Left hand: He exhibits tenderness (mild). He exhibits normal range of motion, no deformity and no swelling.      Comments: Chronically deformed left lower extremity  Skin:    General: Skin is warm and dry.  Neurological:     Mental Status: He is alert.  Psychiatric:        Mood and Affect: Mood is not anxious.      ED Treatments / Results  Labs (all labs ordered are listed,  but only abnormal results are displayed) Labs Reviewed  CBC WITH DIFFERENTIAL/PLATELET - Abnormal; Notable for the following components:      Result Value   WBC 11.4 (*)    Lymphs Abs 4.4 (*)    Abs Immature Granulocytes 0.09 (*)    All other components within normal limits  D-DIMER, QUANTITATIVE (NOT AT [facility])  BASIC METABOLIC PANEL  TROPONIN I (HIGH SENSITIVITY)  TROPONIN I (HIGH SENSITIVITY)    EKG EKG Interpretation  Date/Time:  Saturday December 24 2018 18:22:22 EDT Ventricular Rate:  82 PR Interval:    QRS Duration: 140 QT Interval:  381 QTC Calculation: 445 R Axis:   63 Text Interpretation:  Sinus rhythm Nonspecific intraventricular conduction delay Probable anteroseptal infarct, old Interpretation limited secondary to artifact otherwise similar to June 2020 Confirmed by Pricilla LovelessGoldston, Alyana Kreiter [phone]) on 12/24/2018 6:35:06 PM   Radiology Dg Chest 1 View  Result Date: 12/24/2018 CLINICAL DATA:  Left-sided arm pain and chest pain x3 days EXAM: CHEST  1 VIEW COMPARISON:  November 14, 2018 FINDINGS: The heart size and mediastinal contours are within normal limits. Both lungs are clear. The visualized skeletal structures are unremarkable. IMPRESSION: No active disease. Electronically Signed   By: Katherine Mantlehristopher  Green M.D.   On: 12/24/2018 19:53   Dg Wrist Complete Left  Result Date: 12/24/2018 CLINICAL DATA:  Hand and wrist pain EXAM: LEFT WRIST - COMPLETE 3+ VIEW; LEFT HAND - COMPLETE 3+ VIEW COMPARISON:  None. FINDINGS: No acute fracture or dislocation of the left hand or wrist. There is a probable chronic fracture deformity of the distal left radius. Joint spaces are well preserved. The carpus is normally aligned.  IMPRESSION: No acute fracture or dislocation of the left hand or wrist. There is a probable chronic fracture deformity of the distal left radius. Joint spaces are well preserved. The carpus is normally aligned. Electronically Signed   By: Lauralyn PrimesAlex  Bibbey M.D.   On: 12/24/2018 19:53   Dg Hand Complete Left  Result Date: 12/24/2018 CLINICAL DATA:  Hand and wrist pain EXAM: LEFT WRIST - COMPLETE 3+ VIEW; LEFT HAND - COMPLETE 3+ VIEW COMPARISON:  None. FINDINGS: No acute fracture or dislocation of the left hand or wrist. There is a probable chronic fracture deformity of the distal left radius. Joint spaces are well preserved. The carpus is normally aligned. IMPRESSION: No acute fracture or dislocation of the left hand or wrist. There is a probable chronic fracture deformity of the distal left radius. Joint spaces are well preserved. The carpus is normally aligned. Electronically Signed   By: Lauralyn PrimesAlex  Bibbey M.D.   On: 12/24/2018 19:53    Procedures Procedures (including critical care time)  Medications Ordered in ED Medications  morphine 4 MG/ML injection 4 mg (4 mg Intravenous Given 12/24/18 2025)     Initial Impression / Assessment and Plan / ED Course  I have reviewed the triage vital signs and the nursing notes.  Pertinent labs & imaging results that were available during my care of the patient were reviewed by me and considered in my medical decision making (see chart for details).        Patient's pain appears to be muscular given how positional it is.  Might also be from how he's using his crutches. However there seem to be some sort of pleuritic component so d-dimer was sent and is negative.  He does have cardiac disease but his ECG is unchanged and he has troponins negative x2.  This is also not typical for  his cardiac type pain.  At this point, we discussed Tylenol and ibuprofen as well as follow-up with PCP.  We discussed return precautions.  Final Clinical Impressions(s) / ED Diagnoses    Final diagnoses:  Chest wall pain  Left arm pain    ED Discharge Orders    None       Sherwood Gambler, MD 12/24/18 3825    Sherwood Gambler, MD 12/25/18 [phone]

## 2018-12-24 NOTE — ED Notes (Signed)
Patient transported to X-ray 

## 2019-01-01 ENCOUNTER — Other Ambulatory Visit: Payer: Self-pay | Admitting: Internal Medicine

## 2019-02-14 ENCOUNTER — Telehealth: Payer: Self-pay | Admitting: Internal Medicine

## 2019-02-14 ENCOUNTER — Telehealth: Payer: Medicaid Other | Admitting: Internal Medicine

## 2019-02-14 NOTE — Telephone Encounter (Signed)
Attempted to call patient on mobile # listed x5 for e-visit. His phone went to voicemail and his voicemail was not set up. Called home # listed in chart, which was his daughter's number. He was marked as a "no show" for his virtual visit on 02/14/2019.

## 2019-03-24 ENCOUNTER — Encounter (HOSPITAL_COMMUNITY): Payer: Self-pay | Admitting: Emergency Medicine

## 2019-03-24 ENCOUNTER — Other Ambulatory Visit: Payer: Self-pay

## 2019-03-24 ENCOUNTER — Emergency Department (HOSPITAL_COMMUNITY)
Admission: EM | Admit: 2019-03-24 | Discharge: 2019-03-24 | Disposition: A | Discharge status: Home / Self Care | Payer: Medicaid Other | Attending: Emergency Medicine | Admitting: Emergency Medicine

## 2019-03-24 DIAGNOSIS — R030 Elevated blood-pressure reading, without diagnosis of hypertension: Secondary | ICD-10-CM

## 2019-03-24 DIAGNOSIS — R0981 Nasal congestion: Secondary | ICD-10-CM | POA: Diagnosis not present

## 2019-03-24 DIAGNOSIS — I10 Essential (primary) hypertension: Secondary | ICD-10-CM | POA: Diagnosis not present

## 2019-03-24 DIAGNOSIS — F17210 Nicotine dependence, cigarettes, uncomplicated: Secondary | ICD-10-CM | POA: Diagnosis not present

## 2019-03-24 DIAGNOSIS — I2510 Atherosclerotic heart disease of native coronary artery without angina pectoris: Secondary | ICD-10-CM | POA: Insufficient documentation

## 2019-03-24 DIAGNOSIS — Z959 Presence of cardiac and vascular implant and graft, unspecified: Secondary | ICD-10-CM | POA: Insufficient documentation

## 2019-03-24 DIAGNOSIS — Z79899 Other long term (current) drug therapy: Secondary | ICD-10-CM | POA: Insufficient documentation

## 2019-03-24 DIAGNOSIS — R040 Epistaxis: Secondary | ICD-10-CM | POA: Diagnosis not present

## 2019-03-24 DIAGNOSIS — Z7982 Long term (current) use of aspirin: Secondary | ICD-10-CM | POA: Diagnosis not present

## 2019-03-24 DIAGNOSIS — R042 Hemoptysis: Secondary | ICD-10-CM | POA: Diagnosis present

## 2019-03-24 LAB — COMPREHENSIVE METABOLIC PANEL
ALT: 28 U/L (ref 0–44)
AST: 25 U/L (ref 15–41)
Albumin: 4 g/dL (ref 3.5–5.0)
Alkaline Phosphatase: 79 U/L (ref 38–126)
Anion gap: 10 (ref 5–15)
BUN: 8 mg/dL (ref 6–20)
CO2: 22 mmol/L (ref 22–32)
Calcium: 9.5 mg/dL (ref 8.9–10.3)
Chloride: 106 mmol/L (ref 98–111)
Creatinine, Ser: 0.7 mg/dL (ref 0.61–1.24)
GFR calc Af Amer: >60 mL/min (ref >60)
GFR calc non Af Amer: >60 mL/min (ref >60)
Glucose, Bld: 99 mg/dL (ref 70–99)
Potassium: 3.5 mmol/L (ref 3.5–5.1)
Sodium: 138 mmol/L (ref 135–145)
Total Bilirubin: 0.4 mg/dL (ref 0.3–1.2)
Total Protein: 7.2 g/dL (ref 6.5–8.1)

## 2019-03-24 LAB — CBC
HCT: 43.5 % (ref 39.0–52.0)
Hemoglobin: 14.7 g/dL (ref 13.0–17.0)
MCH: 30.7 pg (ref 26.0–34.0)
MCHC: 33.8 g/dL (ref 30.0–36.0)
MCV: 90.8 fL (ref 80.0–100.0)
Platelets: 215 10*3/uL (ref 150–400)
RBC: 4.79 MIL/uL (ref 4.22–5.81)
RDW: 13.2 % (ref 11.5–15.5)
WBC: 15.4 10*3/uL — ABNORMAL HIGH (ref 4.0–10.5)
nRBC: 0 % (ref 0.0–0.2)

## 2019-03-24 LAB — TYPE AND SCREEN
ABO/RH(D): O POS
Antibody Screen: NEGATIVE

## 2019-03-24 LAB — ABO/RH: ABO/RH(D): O POS

## 2019-03-24 MED ORDER — SALINE SPRAY 0.65 % NA SOLN
1.0000 | Freq: Once | NASAL | Status: AC
  Administered 2019-03-24: 1 via NASAL
  Filled 2019-03-24: qty 44

## 2019-03-24 MED ORDER — SALINE SPRAY 0.65 % NA SOLN: 1.0000 | NASAL | 0 refills | Status: DC | PRN

## 2019-03-24 MED ORDER — AMOXICILLIN-POT CLAVULANATE 875-125 MG PO TABS: 1.0000 | ORAL_TABLET | Freq: Two times a day (BID) | ORAL | 0 refills | Status: DC

## 2019-03-24 MED ORDER — AMOXICILLIN-POT CLAVULANATE 875-125 MG PO TABS
1.0000 | ORAL_TABLET | Freq: Once | ORAL | Status: AC
  Administered 2019-03-24: 1 via ORAL
  Filled 2019-03-24: qty 1

## 2019-03-24 NOTE — ED Triage Notes (Signed)
Patient is complaining of bloody spit and high blood pressure. Patient states he didn't take his blood pressure medication due to taking an aspirin. Patient states this started 7 days.

## 2019-03-24 NOTE — ED Notes (Signed)
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 He took four pills of the cough/sinus medicine today and says he feels like what ever he is spitting up is coming from his nose.

## 2019-03-24 NOTE — ED Provider Notes (Signed)
[facility] DEPT Provider Note   CSN: [id number] Arrival date & time: 03/24/19  0129     History   Chief Complaint Chief Complaint  Patient presents with  . Hypertension  . Hemoptysis    HPI John Mcintyre is a 48 y.o. male.     Patient presents to the emergency department with a chief complaint of sinus congestion and elevated blood pressure.  He states that he has had some blood in his sinus discharge.  He reports taking over-the-counter Tylenol Sinus.  He denies any fevers or chills.  Denies any chest pain or shortness of breath.  He does not take anything for his blood pressure.  He denies any other associated symptoms.  The history is provided by the patient. No language interpreter was used.    Past Medical History:  Diagnosis Date  . Coronary artery disease   . Hypercholesteremia   . Hypertension   . Polio     Patient Active Problem List   Diagnosis Date Noted  . Pre-operative cardiovascular examination 09/14/2017  . Pulmonary nodule 09/14/2017  . 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 Left lumbar radiculopathy 08/25/2017  . Neuropathic pain 10/15/2016  . Hypertensive urgency 04/19/2016  . CAD S/P LAD DES 12/23/15 12/23/2015  . Tobacco abuse 12/23/2015  . Essential hypertension 10/11/2014  . Hypercholesteremia 10/11/2014  . Chest pain 10/11/2014  . Polio     Past Surgical History:  Procedure Laterality Date  . CARDIAC CATHETERIZATION N/A 12/23/2015   Procedure: Left Heart Cath and Coronary Angiography;  Surgeon: Burnell Blanks, MD;  Location: [place] CV LAB;  Service: Cardiovascular;  Laterality: N/A;  . CARDIAC CATHETERIZATION N/A 12/23/2015   Procedure: Coronary Stent Intervention;  Surgeon: Burnell Blanks, MD;  Location: [place] CV LAB;  Service: Cardiovascular;  Laterality: N/A;  . CORONARY STENT PLACEMENT  12/23/2015   Successful PTCA/DES x 1 proximal LAD  . HIP SURGERY Left 1987   for polio, not  sure what was done  . KNEE SURGERY Left 1987   for polio        Home Medications    Prior to Admission medications   Medication Sig Start Date End Date Taking? Authorizing Provider  aspirin EC 81 MG tablet Take 81 mg by mouth daily.   Yes [provider]  ibuprofen (ADVIL) 200 MG tablet Take 200-400 mg by mouth every 6 (six) hours as needed for moderate pain.   Yes [provider]  lisinopril (ZESTRIL) 20 MG tablet TAKE 1 TABLET BY MOUTH EVERY DAY 01/02/19  Yes Lendon Colonel, NP  lovastatin (MEVACOR) 20 MG tablet TAKE 1 TABLET (20 MG TOTAL) BY MOUTH AT BEDTIME. Patient taking differently: Take 20 mg by mouth at bedtime.  01/28/18  Yes Hilty, Nadean Corwin, MD  metoprolol tartrate (LOPRESSOR) 50 MG tablet Take 1 tablet (50 mg total) by mouth 2 (two) times daily. 50MG  IN THE AM AND 25MG  (1/2 TAB) IN THE PM 12/22/18  Yes Lendon Colonel, NP  nitroGLYCERIN (NITROSTAT) 0.4 MG SL tablet Place 1 tablet (0.4 mg total) under the tongue every 5 (five) minutes as needed for chest pain. 07/26/18  Yes Hilty, Nadean Corwin, MD  amLODipine (NORVASC) 5 MG tablet Take 1 tablet (5 mg total) by mouth daily. Patient not taking: Reported on 03/24/2019 09/15/18 12/14/18  Lendon Colonel, NP  amoxicillin-clavulanate (AUGMENTIN) 875-125 MG tablet Take 1 tablet by mouth every 12 (twelve) hours. 03/24/19   Montine Circle, PA-C  Diclofenac Sodium CR  100 MG 24 hr tablet Take 1 tablet (100 mg total) by mouth daily. Patient not taking: Reported on 03/24/2019 11/14/18   Palumbo, April, MD  isosorbide mononitrate (IMDUR) 30 MG 24 hr tablet Take 0.5 tablets (15 mg total) by mouth daily. Patient not taking: Reported on 03/24/2019 09/21/18 12/20/18  Jodelle GrossLawrence, Kathryn M, NP  methocarbamol (ROBAXIN) 500 MG tablet Take 1 tablet (500 mg total) by mouth 2 (two) times daily. Patient not taking: Reported on 03/24/2019 09/14/18   McDonald, Mia A, PA-C  ondansetron (ZOFRAN ODT) 4 MG disintegrating tablet Take 1  tablet (4 mg total) by mouth every 8 (eight) hours as needed for nausea or vomiting. Patient not taking: Reported on 03/24/2019 06/18/18   Elpidio AnisUpstill, Shari, PA-C  sodium chloride (OCEAN) 0.65 % SOLN nasal spray Place 1 spray into both nostrils as needed for congestion. 03/24/19   Roxy HorsemanBrowning, Loral Campi, PA-C  varenicline (CHANTIX CONTINUING MONTH PAK) 1 MG tablet Take 1 tablet (1 mg total) by mouth 2 (two) times daily. Patient not taking: Reported on 03/24/2019 09/21/18   Jodelle GrossLawrence, Kathryn M, NP    Family History Family History  Problem Relation Age of Onset  . Hypertension Mother   . Heart failure Father     Social History Social History   Tobacco Use  . Smoking status: Current Every Day Smoker    Packs/day: 1.00    Types: Cigarettes  . Smokeless tobacco: Never Used  . Tobacco comment: ultra lights  Substance Use Topics  . Alcohol use: Yes    Comment: occ  . Drug use: No     Allergies   Poractant alfa, Pork-derived products, and Tape   Review of Systems Review of Systems  All other systems reviewed and are negative.    Physical Exam Updated Vital Signs BP (!) 163/110   Pulse 75   Temp 98.1 F (36.7 C) (Oral)   Resp (!) 21   Ht 5\' 5"  (1.651 m)   Wt 77.1 kg   SpO2 99%   BMI 28.29 kg/m   Physical Exam Vitals signs and nursing note reviewed.  Constitutional:      Appearance: He is well-developed.  HENT:     Head: Normocephalic and atraumatic.  Eyes:     Conjunctiva/sclera: Conjunctivae normal.  Neck:     Musculoskeletal: Neck supple.  Cardiovascular:     Rate and Rhythm: Normal rate and regular rhythm.     Heart sounds: No murmur.  Pulmonary:     Effort: Pulmonary effort is normal. No respiratory distress.     Breath sounds: Normal breath sounds.  Abdominal:     Palpations: Abdomen is soft.     Tenderness: There is no abdominal tenderness.  Musculoskeletal: Normal range of motion.  Skin:    General: Skin is warm and dry.  Neurological:     Mental Status:  He is alert and oriented to person, place, and time.  Psychiatric:        Mood and Affect: Mood normal.        Behavior: Behavior normal.      ED Treatments / Results  Labs (all labs ordered are listed, but only abnormal results are displayed) Labs Reviewed  CBC - Abnormal; Notable for the following components:      Result Value   WBC 15.4 (*)    All other components within normal limits  COMPREHENSIVE METABOLIC PANEL  TYPE AND SCREEN  ABO/RH    EKG None  Radiology No results found.  Procedures Procedures (including  critical care time)  Medications Ordered in ED Medications  amoxicillin-clavulanate (AUGMENTIN) 875-125 MG per tablet 1 tablet (has no administration in time range)  sodium chloride (OCEAN) 0.65 % nasal spray 1 spray (has no administration in time range)     Initial Impression / Assessment and Plan / ED Course  I have reviewed the triage vital signs and the nursing notes.  Pertinent labs & imaging results that were available during my care of the patient were reviewed by me and considered in my medical decision making (see chart for details).        Patient with elevated blood pressures today.  He has been taking Tylenol Sinus for the past several days, and took it 4 times today.  He also reports having some blood in his sinus drainage.  He has not been coughing up any blood.  He denies any chest pain or shortness of breath.  He is in no acute distress.  Question early sinus infection.  Have advised patient to ease off on the over-the-counter sinus medication.  Will give nasal spray and cover with Augmentin.  Recommend follow-up with PCP.  Final Clinical Impressions(s) / ED Diagnoses   Final diagnoses:  Elevated blood pressure reading  Sinus congestion  Epistaxis    ED Discharge Orders         Ordered    amoxicillin-clavulanate (AUGMENTIN) 875-125 MG tablet  Every 12 hours     03/24/19 0258    sodium chloride (OCEAN) 0.65 % SOLN nasal spray   As needed     03/24/19 0258           Roxy Horseman, PA-C 03/24/19 0340    Palumbo, April, MD 03/24/19 [phone]

## 2019-03-24 NOTE — Discharge Instructions (Addendum)
Stop using the over-the-counter sinus medication.  This medicine elevates your blood pressure.  Elevated blood pressure can cause nose bleeds.  Take the nose spray and antibiotics as directed.

## 2019-05-03 ENCOUNTER — Other Ambulatory Visit: Payer: Self-pay | Admitting: Internal Medicine

## 2019-05-04 ENCOUNTER — Telehealth: Payer: Self-pay | Admitting: Internal Medicine

## 2019-05-04 NOTE — Telephone Encounter (Signed)
Patient is calling stating he is experiencing pain on the left side of his back and shoulder. He is scheduled for an appointment regarding it on 05/18/19.

## 2019-05-04 NOTE — Telephone Encounter (Signed)
Pt called to report that he has been having pain across his back between his shoulders and sometimes into his shoulders.. he has been very "achy".... he denies pain in his chest at this time. He is not SOB but feels dizzy all of the time. He says he has sinus problems and has spoke with his PCP and has a nasal spray but it is not helping the dizziness.   Pt says his BP is 133/74 and he has been very tired. He is wondering if his meds could be causing his problems.   Pt has an appt with A Duke PA 05/18/19.[name] will forward msg to Dr. Debara Pickett for review and any potential med changes or reccomendations prior to that visit.

## 2019-05-05 NOTE — Telephone Encounter (Signed)
Agree with follow-up on 12/17.  Dr Lemmie Evens

## 2019-05-05 NOTE — Telephone Encounter (Signed)
Pt advised and will keep 05/18/19 appt.

## 2019-05-11 NOTE — Progress Notes (Deleted)
Cardiology Office Note:    Date:  05/11/2019   ID:  John Mcintyre, DOB December 22, 1970, MRN [id number]  PCP:  Elisabeth Cara, PA-C  Cardiologist:  Pixie Casino, MD   Referring MD: Belva Bertin, [place], *   No chief complaint on file. ***  History of Present Illness:    John Mcintyre is a 48 y.o. male with a hx of CAD, HTN with hypertensive urgency, HLD, tobacco use, and pulmonary nodule. He had a left heart cath revealed 2v disease with CTO of RCA with left-to-right collaterals, 90% stenosis in the proximal LAD successfully treated with DSE 12/23/15. He has residual 40% stenosis in the proximal and mid LAD. EF was estimated as 45-50% on heart cath. No follow up echo. He had a negative stress test 08/11/17.   He has been seen in the ER multiple times for chest pain and ruled out each time for ACS.  He was seen in the ER 12/24/18 with atypical chest pain, but ruled out with negative troponin and nonischemic EKG.     Past Medical History:  Diagnosis Date  . Coronary artery disease   . Hypercholesteremia   . Hypertension   . Polio     Past Surgical History:  Procedure Laterality Date  . CARDIAC CATHETERIZATION N/A 12/23/2015   Procedure: Left Heart Cath and Coronary Angiography;  Surgeon: Burnell Blanks, MD;  Location: [facility];  Service: Cardiovascular;  Laterality: N/A;  . CARDIAC CATHETERIZATION N/A 12/23/2015   Procedure: Coronary Stent Intervention;  Surgeon: Burnell Blanks, MD;  Location: [place] [facility];  Service: Cardiovascular;  Laterality: N/A;  . CORONARY STENT PLACEMENT  12/23/2015   Successful PTCA/DES x 1 proximal LAD  . HIP SURGERY Left 1987   for polio, not sure what was done  . KNEE SURGERY Left 1987   for polio    Current Medications: No outpatient medications have been marked as taking for the 05/18/19 encounter (Appointment) with Ledora Bottcher, [place].     Allergies:   Poractant alfa, Pork-derived products, and Tape    Social History   Socioeconomic History  . Marital status: Married    Spouse name: Not on file  . Number of children: 3  . Years of education: 2 years of college  . Highest education level: Not on file  Occupational History  . Occupation: Architect  Tobacco Use  . Smoking status: Current Every Day Smoker    Packs/day: 1.00    Types: Cigarettes  . Smokeless tobacco: Never Used  . Tobacco comment: ultra lights  Substance and Sexual Activity  . Alcohol use: Yes    Comment: occ  . Drug use: No  . Sexual activity: Not on file  Other Topics Concern  . Not on file  Social History Narrative   Lives at home with wife and three children.   Right-handed.   5 cups tea per day.   Social Determinants of Health   Financial Resource Strain:   . Difficulty of Paying Living Expenses: Not on file  Food Insecurity:   . Worried About Charity fundraiser in the Last Year: Not on file  . Ran Out of Food in the Last Year: Not on file  Transportation Needs:   . Lack of Transportation (Medical): Not on file  . Lack of Transportation (Non-Medical): Not on file  Physical Activity:   . Days of Exercise per Week: Not on file  . Minutes of Exercise per Session: Not on  file  Stress:   . Feeling of Stress : Not on file  Social Connections:   . Frequency of Communication with Friends and Family: Not on file  . Frequency of Social Gatherings with Friends and Family: Not on file  . Attends Religious Services: Not on file  . Active Member of Clubs or Organizations: Not on file  . Attends Banker Meetings: Not on file  . Marital Status: Not on file     Family History: The patient's ***family history includes Heart failure in his father; Hypertension in his mother.  ROS:   Please see the history of present illness.    *** All other systems reviewed and are negative.  EKGs/Labs/Other Studies Reviewed:    The following studies were reviewed today: ***  EKG:  EKG is *** ordered  today.  The ekg ordered today demonstrates ***  Recent Labs: 03/24/2019: ALT 28; BUN 8; Creatinine, Ser 0.70; Hemoglobin 14.7; Platelets 215; Potassium 3.5; Sodium 138  Recent Lipid Panel    Component Value Date/Time   CHOL 158 12/18/2015 1016   TRIG 302 (H) 12/18/2015 1016   HDL 25 (L) 12/18/2015 1016   CHOLHDL 6.3 (H) 12/18/2015 1016   VLDL 60 (H) 12/18/2015 1016   LDLCALC 73 12/18/2015 1016    Physical Exam:    VS:  There were no vitals taken for this visit.    Wt Readings from Last 3 Encounters:  03/24/19 170 lb (77.1 kg)  12/24/18 165 lb (74.8 kg)  08/09/18 171 lb (77.6 kg)     GEN: *** Well nourished, well developed in no acute distress HEENT: Normal NECK: No JVD; No carotid bruits LYMPHATICS: No lymphadenopathy CARDIAC: ***RRR, no murmurs, rubs, gallops RESPIRATORY:  Clear to auscultation without rales, wheezing or rhonchi  ABDOMEN: Soft, non-tender, non-distended MUSCULOSKELETAL:  No edema; No deformity  SKIN: Warm and dry NEUROLOGIC:  Alert and oriented x 3 PSYCHIATRIC:  Normal affect   ASSESSMENT:    No diagnosis found. PLAN:    In order of problems listed above:  No diagnosis found.   Medication Adjustments/Labs and Tests Ordered: Current medicines are reviewed at length with the patient today.  Concerns regarding medicines are outlined above.  No orders of the defined types were placed in this encounter.  No orders of the defined types were placed in this encounter.   Signed, Marcelino Duster, PA  05/11/2019 10:52 AM    [facility]

## 2019-05-18 ENCOUNTER — Other Ambulatory Visit: Payer: Self-pay | Admitting: *Deleted

## 2019-05-18 ENCOUNTER — Ambulatory Visit: Payer: Medicaid Other | Admitting: Physician Assistant

## 2019-05-18 MED ORDER — LISINOPRIL 20 MG PO TABS: 20.0000 mg | ORAL_TABLET | Freq: Every day | ORAL | 3 refills | Status: DC

## 2019-05-21 NOTE — Progress Notes (Deleted)
Cardiology Office Note   Date:  05/21/2019   ID:  Roi Jafari, DOB 10-30-1970, MRN [id number]  PCP:  Elisabeth Cara, PA-C  Cardiologist: Dr. Debara Pickett No chief complaint on file.    History of Present Illness: John Mcintyre is a 48 y.o. male who presents for ongoing assessment and management of hypertension, and CAD.  Most recent cardiac catheterization completed on 12/23/2015, revealed double vessel CAD with unstable angina, chronic occlusion of the proximal RCA with filling of the mid and distal vessel from left to right collaterals, severe stenosis of the proximal LAD, moderate proximal circumflex stenosis, successful PTCA/DES x1 to the proximal LAD with mildly reduced systolic function.  He was to continue aspirin and Plavix for 1 year along with beta-blocker therapy.   He was last seen in the office on 09/21/2018 and was very anxious about his overall health, continued to smoke heavily, and remained hypertensive.  He was to follow-up with his primary cardiologist Dr. Debara Pickett for ongoing management however did not come to the office visit as requested.  The patient called our office on 05/04/2019 with complaints of pain on his left side of his back and shoulder.  The patient is here today for follow-up    Past Medical History:  Diagnosis Date  . Coronary artery disease   . Hypercholesteremia   . Hypertension   . Polio     Past Surgical History:  Procedure Laterality Date  . CARDIAC CATHETERIZATION N/A 12/23/2015   Procedure: Left Heart Cath and Coronary Angiography;  Surgeon: Burnell Blanks, MD;  Location: [facility];  Service: Cardiovascular;  Laterality: N/A;  . CARDIAC CATHETERIZATION N/A 12/23/2015   Procedure: Coronary Stent Intervention;  Surgeon: Burnell Blanks, MD;  Location: [employer] [facility];  Service: Cardiovascular;  Laterality: N/A;  . CORONARY STENT PLACEMENT  12/23/2015   Successful PTCA/DES x 1 proximal LAD  . HIP SURGERY Left 1987   for  polio, not sure what was done  . KNEE SURGERY Left 1987   for polio     Current Outpatient Medications  Medication Sig Dispense Refill  . amLODipine (NORVASC) 5 MG tablet Take 1 tablet (5 mg total) by mouth daily. (Patient not taking: Reported on 03/24/2019) 30 tablet 3  . amoxicillin-clavulanate (AUGMENTIN) 875-125 MG tablet Take 1 tablet by mouth every 12 (twelve) hours. 14 tablet 0  . aspirin EC 81 MG tablet Take 81 mg by mouth daily.    . Diclofenac Sodium CR 100 MG 24 hr tablet Take 1 tablet (100 mg total) by mouth daily. (Patient not taking: Reported on 03/24/2019) 7 tablet 0  . ibuprofen (ADVIL) 200 MG tablet Take 200-400 mg by mouth every 6 (six) hours as needed for moderate pain.    . isosorbide mononitrate (IMDUR) 30 MG 24 hr tablet Take 0.5 tablets (15 mg total) by mouth daily. (Patient not taking: Reported on 03/24/2019) 45 tablet 2  . lisinopril (ZESTRIL) 20 MG tablet Take 1 tablet (20 mg total) by mouth daily. 30 tablet 3  . lovastatin (MEVACOR) 20 MG tablet TAKE 1 TABLET BY MOUTH EVERYDAY AT BEDTIME *NEEDS OFFICE VISIT FOR FURTHER REFILLS* 30 tablet 0  . methocarbamol (ROBAXIN) 500 MG tablet Take 1 tablet (500 mg total) by mouth 2 (two) times daily. (Patient not taking: Reported on 03/24/2019) 20 tablet 0  . metoprolol tartrate (LOPRESSOR) 50 MG tablet Take 1 tablet (50 mg total) by mouth 2 (two) times daily. 50MG  IN THE AM AND 25MG  (1/2 TAB) IN  THE PM 45 tablet 6  . nitroGLYCERIN (NITROSTAT) 0.4 MG SL tablet Place 1 tablet (0.4 mg total) under the tongue every 5 (five) minutes as needed for chest pain. 25 tablet 6  . ondansetron (ZOFRAN ODT) 4 MG disintegrating tablet Take 1 tablet (4 mg total) by mouth every 8 (eight) hours as needed for nausea or vomiting. (Patient not taking: Reported on 03/24/2019) 20 tablet 0  . sodium chloride (OCEAN) 0.65 % SOLN nasal spray Place 1 spray into both nostrils as needed for congestion. 60 mL 0  . varenicline (CHANTIX CONTINUING MONTH PAK) 1 MG  tablet Take 1 tablet (1 mg total) by mouth 2 (two) times daily. (Patient not taking: Reported on 03/24/2019) 56 tablet 0   No current facility-administered medications for this visit.    Allergies:   Poractant alfa, Pork-derived products, and Tape    Social History:  The patient  reports that he has been smoking cigarettes. He has been smoking about 1.00 pack per day. He has never used smokeless tobacco. He reports current alcohol use. He reports that he does not use drugs.   Family History:  The patient's family history includes Heart failure in his father; Hypertension in his mother.    ROS: All other systems are reviewed and negative. Unless otherwise mentioned in H&P    PHYSICAL EXAM: VS:  There were no vitals taken for this visit. , BMI There is no height or weight on file to calculate BMI. GEN: Well nourished, well developed, in no acute distress HEENT: normal Neck: no JVD, carotid bruits, or masses Cardiac: ***RRR; no murmurs, rubs, or gallops,no edema  Respiratory:  Clear to auscultation bilaterally, normal work of breathing GI: soft, nontender, nondistended, + BS MS: no deformity or atrophy Skin: warm and dry, no rash Neuro:  Strength and sensation are intact Psych: euthymic mood, full affect   EKG:  EKG {ACTION; IS/IS ION:[id number]} ordered today. The ekg ordered today demonstrates ***   Recent Labs: 03/24/2019: ALT 28; BUN 8; Creatinine, Ser 0.70; Hemoglobin 14.7; Platelets 215; Potassium 3.5; Sodium 138    Lipid Panel    Component Value Date/Time   CHOL 158 12/18/2015 1016   TRIG 302 (H) 12/18/2015 1016   HDL 25 (L) 12/18/2015 1016   CHOLHDL 6.3 (H) 12/18/2015 1016   VLDL 60 (H) 12/18/2015 1016   LDLCALC 73 12/18/2015 1016      Wt Readings from Last 3 Encounters:  03/24/19 170 lb (77.1 kg)  12/24/18 165 lb (74.8 kg)  08/09/18 171 lb (77.6 kg)      Other studies Reviewed: NM Stress Test Aug 21, 2017  Nuclear stress EF: 42%. Distal apical anteroseptal  akinesis.  The left ventricular ejection fraction is moderately decreased (30-44%).  There was no ST segment deviation noted during stress.  Defect 1: There is a medium defect of moderate severity present in the mid anteroseptal and apical anterior location.  Findings consistent with prior myocardial infarction with peri-infarct ischemia. Mild increase in size of area involved when compared to prior study. Prior LAD stent.  This is an intermediate risk study.  LHC 12/23/2015  Prox RCA lesion, 100 %stenosed.  Prox Cx lesion, 40 %stenosed.  Dist Cx lesion, 40 %stenosed.  There is mild left ventricular systolic dysfunction.  LV end diastolic pressure is normal.  The left ventricular ejection fraction is 45-50% by visual estimate.  There is no mitral valve regurgitation.  A drug eluting stent was successfully placed.  Prox LAD lesion, 95 %stenosed.  Post intervention,  there is a 0% residual stenosis.   1. Double vessel CAD with unstable angina.  2. Chronic occlusion of the proximal RCA with filling of the mid and distal vessel from left to right collaterals.  3. Severe stenosis proximal LAD 4. Moderate proximal Circumflex stenosis 5. Successful PTCA/DES x 1 proximal LAD 6. Mild LV systolic dysfunction  Recommendations: Will continue ASA, Plavix for one year. Continue beta blocker and statin. Would consider changing to high dose Lipitor at discharge. Smoking cessation.   ASSESSMENT AND PLAN:  1.  ***   Current medicines are reviewed at length with the patient today.    Labs/ tests ordered today include: *** Bettey MareKathryn M. Liborio NixonLawrence DNP, ANP, AACC   05/21/2019 2:06 PM    [facility] [address] Office [phone] Fax [phone]  Notice: This dictation was prepared with Dragon dictation along with smaller phrase technology. Any transcriptional errors that result from this process are unintentional and may not be corrected upon  review.

## 2019-05-22 ENCOUNTER — Ambulatory Visit: Payer: Medicaid Other | Admitting: Adult Health

## 2019-06-06 ENCOUNTER — Other Ambulatory Visit: Payer: Self-pay | Admitting: Internal Medicine

## 2019-06-06 MED ORDER — LOVASTATIN 20 MG PO TABS: ORAL_TABLET | ORAL | 0 refills | Status: DC

## 2019-06-06 NOTE — Telephone Encounter (Signed)
New Message      *STAT* If patient is at the pharmacy, call can be transferred to refill team.   1. Which medications need to be refilled? (please list name of each medication and dose if known) lovastatin (MEVACOR) 20 MG tablet  2. Which pharmacy/location (including street and city if local pharmacy) is medication to be sent to? [id number] - [place], [place] - [address]  3. Do they need a 30 day or 90 day supply? 90

## 2019-06-06 NOTE — Telephone Encounter (Signed)
Follow up   Pt is out of medication and needs to pick up today

## 2019-06-06 NOTE — Telephone Encounter (Signed)
Rx has been sent to the pharmacy electronically. ° °

## 2019-06-09 ENCOUNTER — Encounter: Payer: Medicaid Other | Admitting: Physician Assistant

## 2019-06-10 NOTE — Progress Notes (Signed)
This encounter was created in error - please disregard.

## 2019-06-26 ENCOUNTER — Other Ambulatory Visit: Payer: Self-pay | Admitting: Adult Health

## 2019-06-30 ENCOUNTER — Encounter: Payer: Self-pay | Admitting: Medical

## 2019-06-30 ENCOUNTER — Encounter (INDEPENDENT_AMBULATORY_CARE_PROVIDER_SITE_OTHER): Payer: Self-pay

## 2019-06-30 ENCOUNTER — Ambulatory Visit (INDEPENDENT_AMBULATORY_CARE_PROVIDER_SITE_OTHER): Payer: Medicaid Other | Admitting: Family Medicine

## 2019-06-30 ENCOUNTER — Other Ambulatory Visit: Payer: Self-pay

## 2019-06-30 VITALS — BP 149/93 | HR 90 | Temp 97.5°F | Ht 65.0 in | Wt 174.0 lb

## 2019-06-30 DIAGNOSIS — I10 Essential (primary) hypertension: Secondary | ICD-10-CM | POA: Diagnosis not present

## 2019-06-30 DIAGNOSIS — I2510 Atherosclerotic heart disease of native coronary artery without angina pectoris: Secondary | ICD-10-CM

## 2019-06-30 DIAGNOSIS — Z9861 Coronary angioplasty status: Secondary | ICD-10-CM | POA: Diagnosis not present

## 2019-06-30 DIAGNOSIS — E7800 Pure hypercholesterolemia, unspecified: Secondary | ICD-10-CM | POA: Diagnosis not present

## 2019-06-30 DIAGNOSIS — G14 Postpolio syndrome: Secondary | ICD-10-CM

## 2019-06-30 MED ORDER — AMLODIPINE BESYLATE 5 MG PO TABS: 5.0000 mg | ORAL_TABLET | Freq: Every day | ORAL | 3 refills | Status: DC

## 2019-06-30 MED ORDER — NITROGLYCERIN 0.4 MG SL SUBL: 0.4000 mg | SUBLINGUAL_TABLET | SUBLINGUAL | 3 refills | Status: DC | PRN

## 2019-06-30 MED ORDER — ISOSORBIDE MONONITRATE ER 30 MG PO TB24: 30.0000 mg | ORAL_TABLET | Freq: Every day | ORAL | 0 refills | Status: DC

## 2019-06-30 NOTE — Patient Instructions (Signed)
Medication Instructions:   RESTART Amlodipine 5 mg daily  INCREASE IMDUR to 30 mg (whole tablet) daily *If you need a refill on your cardiac medications before your next appointment, please call your pharmacy*  Lab Work: NONE ordered at this time of appointment   If you have labs (blood work) drawn today and your tests are completely normal, you will receive your results only by: [name] MyChart Message (if you have MyChart) OR . A paper copy in the mail If you have any lab test that is abnormal or we need to change your treatment, we will call you to review the results.  Testing/Procedures: NONE ordered at this time of appointment   Follow-Up: At [facility], you and your health needs are our priority.  As part of our continuing mission to provide you with exceptional heart care, we have created designated Provider Care Teams.  These Care Teams include your primary Cardiologist (physician) and Advanced Practice Providers (APPs -  Physician Assistants and Nurse Practitioners) who all work together to provide you with the care you need, when you need it.  Your next appointment:   6 month(s)  The format for your next appointment:   In Person  Provider:   K. Italy Hilty, MD  Other Instructions

## 2019-06-30 NOTE — Progress Notes (Signed)
Cardiology Office Note  Date: 06/30/2019   ID: John Mcintyre, DOB Aug 11, 1970, MRN [id number]  PCP:  John Cara, PA-C  Cardiologist:  John Casino, MD Electrophysiologist:  None   No chief complaint on file.   History of Present Illness John Mcintyre is a 49 y.o. male last encounter with John Domino, NP via telemedicine September 21, 2018 for follow-up on hypertension.  He has a past medical history of coronary artery disease.  Previous cardiac catheterization in July 2017 revealed chronic total occlusion of the RCA with left-to-right collaterals from circumflex, 40% stenosed circumflex and a 95% stenosed proximal LAD treated with a DES and an EF of 40 to 45%.  Other past medical history includes hyperlipidemia and hypertension in addition to history of polio.  He had been placed on Chantix to help stop smoking.  However he stopped taking it due to fear of side effects.  He has had some dental issues with teeth needing extraction.  He admitted to not stopping smoking and stopped the Chantix because it made him feel badly.  In May 2020 patient presented to the emergency room with complaints of left-sided chest pain and rib pain x4 days.  He has been to the emergency room on multiple occasions for chest pain and has been ruled out for acute coronary syndrome in the past  He was ruled out for ACS during that visit.  He has a history of polio and postpolio syndrome with musculoskeletal pain secondary to musculoskeletal abnormality secondary to polio.  He has multiple complaints of leg pain and wrist pain.  He has an abnormal gait due to 1 leg being shorter than the other and uses crutches to ambulate.  He had an electromyogram [facility] in May 2019 showing;chronic neuropathic changes, mainly at bilateral lower extremity, evidence of re-modification, left worse than right.  This is consistent with his history of polio.  There is no evidence of bilateral  lumbosacral radiculopathy or peripheral neuropathy.  He has issues with elevated blood pressure.  Previously started on amlodipine 5 mg daily for hypertension.  Patient has complained of dizziness and vertigo-like symptoms in the past on several occasions.  He had a CT scan March 29, 2018 for same complaints.  CT was unremarkable.  Patient had paranasal sinus disease and bilateral mastoid effusions.  Current medications reviewed and include; isosorbide mononitrate 30 mg one half tab daily.  Lisinopril 20 mg daily.  Lovastatin 20 mg daily.  Metoprolol 50 mg tablet a.m. and 25 mg tablet p.m.  Sublingual nitroglycerin 0.4 mg as needed for chest pain.  Patient denies any classic anginal symptoms.  He does complain of musculoskeletal pain in his neck arms shoulders worse on movement.  Patient states he used to go to the [facility] and had a Jacuzzi which helped some of the pain.  Blood pressure was elevated on arrival.  Recheck 132/80.  He continues to smoke.  States he was unable to take the Chantix because it made him feel bad.  He continues to smoke about a pack and a half a day.  Advised him if he is not able to stop completely to try to cut down on the amount he is smoking.   Past Medical History:  Diagnosis Date   Coronary artery disease    Hypercholesteremia    Hypertension    Polio     Past Surgical History:  Procedure Laterality Date   CARDIAC CATHETERIZATION N/A 12/23/2015   Procedure: Left Heart Cath  and Coronary Angiography;  Surgeon: Kathleene Hazel, MD;  Location: [facility] INVASIVE CV LAB;  Service: Cardiovascular;  Laterality: N/A;   CARDIAC CATHETERIZATION N/A 12/23/2015   Procedure: Coronary Stent Intervention;  Surgeon: Kathleene Hazel, MD;  Location: [facility] INVASIVE CV LAB;  Service: Cardiovascular;  Laterality: N/A;   CORONARY STENT PLACEMENT  12/23/2015   Successful PTCA/DES x 1 proximal LAD   HIP SURGERY Left 1987   for polio, not sure what was done   KNEE  SURGERY Left 1987   for polio    Current Outpatient Medications  Medication Sig Dispense Refill   amLODipine (NORVASC) 5 MG tablet Take 1 tablet (5 mg total) by mouth daily. (Patient not taking: Reported on 03/24/2019) 30 tablet 3   amoxicillin-clavulanate (AUGMENTIN) 875-125 MG tablet Take 1 tablet by mouth every 12 (twelve) hours. 14 tablet 0   aspirin EC 81 MG tablet Take 81 mg by mouth daily.     Diclofenac Sodium CR 100 MG 24 hr tablet Take 1 tablet (100 mg total) by mouth daily. (Patient not taking: Reported on 03/24/2019) 7 tablet 0   ibuprofen (ADVIL) 200 MG tablet Take 200-400 mg by mouth every 6 (six) hours as needed for moderate pain.     isosorbide mononitrate (IMDUR) 30 MG 24 hr tablet TAKE 1/2 TABLET BY MOUTH DAILY 45 tablet 0   lisinopril (ZESTRIL) 20 MG tablet Take 1 tablet (20 mg total) by mouth daily. 30 tablet 3   lovastatin (MEVACOR) 20 MG tablet TAKE 1 TABLET BY MOUTH EVERYDAY AT BEDTIME 30 tablet 0   methocarbamol (ROBAXIN) 500 MG tablet Take 1 tablet (500 mg total) by mouth 2 (two) times daily. (Patient not taking: Reported on 03/24/2019) 20 tablet 0   metoprolol tartrate (LOPRESSOR) 50 MG tablet Take 1 tablet (50 mg total) by mouth 2 (two) times daily. 50MG  IN THE AM AND 25MG  (1/2 TAB) IN THE PM 45 tablet 6   nitroGLYCERIN (NITROSTAT) 0.4 MG SL tablet Place 1 tablet (0.4 mg total) under the tongue every 5 (five) minutes as needed for chest pain. 25 tablet 6   ondansetron (ZOFRAN ODT) 4 MG disintegrating tablet Take 1 tablet (4 mg total) by mouth every 8 (eight) hours as needed for nausea or vomiting. (Patient not taking: Reported on 03/24/2019) 20 tablet 0   sodium chloride (OCEAN) 0.65 % SOLN nasal spray Place 1 spray into both nostrils as needed for congestion. 60 mL 0   varenicline (CHANTIX CONTINUING MONTH PAK) 1 MG tablet Take 1 tablet (1 mg total) by mouth 2 (two) times daily. (Patient not taking: Reported on 03/24/2019) 56 tablet 0   No current  facility-administered medications for this visit.   Allergies:  Poractant alfa, Pork-derived products, and Tape   Social History: The patient  reports that he has been smoking cigarettes. He has been smoking about 1.00 pack per day. He has never used smokeless tobacco. He reports current alcohol use. He reports that he does not use drugs.   Family History: The patient's family history includes Heart failure in his father; Hypertension in his mother.   ROS:  Please see the history of present illness. Otherwise, complete review of systems is positive for none.  All other systems are reviewed and negative.   Physical Exam: VS:  There were no vitals taken for this visit., BMI There is no height or weight on file to calculate BMI.  Wt Readings from Last 3 Encounters:  03/24/19 170 lb (77.1 kg)  12/24/18 165  lb (74.8 kg)  08/09/18 171 lb (77.6 kg)    General: Patient appears comfortable at rest. HEENT: Conjunctiva and lids normal, oropharynx clear with moist mucosa. Neck: Supple, no elevated JVP or carotid bruits, no thyromegaly. Lungs: Clear to auscultation, nonlabored breathing at rest. Cardiac: Regular rate and rhythm, no S3 or significant systolic murmur, no pericardial rub. Abdomen: Soft, nontender, no hepatomegaly, bowel sounds present, no guarding or rebound. Extremities: No pitting edema, distal pulses 2+. Skin: Warm and dry. Musculoskeletal: No kyphosis. Neuropsychiatric: Alert and oriented x3, affect grossly appropriate.  ECG:  An ECG dated December 24, 2018 was personally reviewed today and demonstrated:  Sinus rhythm nonspecific intraventricular conduction delay, probable anterior septal infarct, old.  Heart rate 82  Recent Labwork: 03/24/2019: ALT 28; AST 25; BUN 8; Creatinine, Ser 0.70; Hemoglobin 14.7; Platelets 215; Potassium 3.5; Sodium 138     Component Value Date/Time   CHOL 158 12/18/2015 1016   TRIG 302 (H) 12/18/2015 1016   HDL 25 (L) 12/18/2015 1016   CHOLHDL 6.3  (H) 12/18/2015 1016   VLDL 60 (H) 12/18/2015 1016   LDLCALC 73 12/18/2015 1016    Other Studies Reviewed Today:   The following studies were reviewed today: NM Stress Test 08/11/2017  Nuclear stress EF: 42%. Distal apical anteroseptal akinesis.  The left ventricular ejection fraction is moderately decreased (30-44%).  There was no ST segment deviation noted during stress.  Defect 1: There is a medium defect of moderate severity present in the mid anteroseptal and apical anterior location.  Findings consistent with prior myocardial infarction with peri-infarct ischemia. Mild increase in size of area involved when compared to prior study. Prior LAD stent.  This is an intermediate risk study.   Cardiac catheterization Conclusion7/24/2017     Prox RCA lesion, 100 %stenosed.  Prox Cx lesion, 40 %stenosed.  Dist Cx lesion, 40 %stenosed.  There is mild left ventricular systolic dysfunction.  LV end diastolic pressure is normal.  The left ventricular ejection fraction is 45-50% by visual estimate.  There is no mitral valve regurgitation.  A drug eluting stent was successfully placed.  Prox LAD lesion, 95 %stenosed.  Post intervention, there is a 0% residual stenosis.  1. Double vessel CAD with unstable angina.  2. Chronic occlusion of the proximal RCA with filling of the mid and distal vessel from left to right collaterals.  3. Severe stenosis proximal LAD 4. Moderate proximal Circumflex stenosis 5. Successful PTCA/DES x 1 proximal LAD 6. Mild LV systolic dysfunction  Recommendations: Will continue ASA, Plavix for one year. Continue beta blocker and statin. Would consider changing to high dose Lipitor at discharge. Smoking cessation    Patient had a nerve conduction study and electromyogram Oct 01, 2017 with [facility] Conclusion: Abnormal study.  There is electrodiagnostic evidence of chronic neuropathic changes, mainly at bilateral lower  extremity, evidence of re-modification, left worse than right.  This is consistent with his history of polio.  There is no evidence of bilateral lumbosacral radiculopathy or peripheral neuropathy.  In addition, there is evidence of bilateral median neuropathy across the wrist, consistent with bilateral carpal tunnel syndromes.  Right side is moderate, left side is mild.  Assessment and Plan:  1. CAD S/P LAD DES 12/23/15   2. Essential hypertension   3. Hypercholesteremia   4. Post-polio syndrome    1. CAD S/P LAD DES 12/23/15 Patient is status post PCI to LAD with drug-eluting stent.  He has chronic total occlusion of RCA with filling of  the mid and distal vessel from left to right collaterals.  He does have episodes of mild chest tightness with or without activity.  Increase Imdur to 30 mg.  Renew sublingual nitroglycerin prescription.  2. Essential hypertension Blood pressure elevated on arrival at 149/93.  Recheck in left arm was 132/80.  Patient states he had just smoked a cigarette prior to arrival.  Restart amlodipine 5 mg daily.  3. Hypercholesteremia Last lipid panel total cholesterol 158, triglycerides 302, HDL 25, VLDL 60, LDL 73.  Continue lovastatin 20 mg daily.  4. Post-polio syndrome Patient walks with crutches and has issues with neuropathy in both legs.  Recent nerve conduction study and electromyelogram result were positive for peripheral neuropathy.   Medication Adjustments/Labs and Tests Ordered: Current medicines are reviewed at length with the patient today.  Concerns regarding medicines are outlined above.    There are no Patient Instructions on file for this visit.       Signed, Rennis Harding, NP 06/30/2019 8:23 AM    [facility] [address] [place], [place], [place] [address] Phone: [phone]; Fax: [phone]

## 2019-07-10 ENCOUNTER — Other Ambulatory Visit: Payer: Self-pay | Admitting: Internal Medicine

## 2019-07-11 NOTE — Telephone Encounter (Signed)
Rx has been sent to the pharmacy electronically. ° °

## 2019-07-18 ENCOUNTER — Other Ambulatory Visit: Payer: Self-pay

## 2019-07-18 MED ORDER — METOPROLOL TARTRATE 50 MG PO TABS: 50.0000 mg | ORAL_TABLET | Freq: Two times a day (BID) | ORAL | 6 refills | Status: DC

## 2019-08-03 ENCOUNTER — Ambulatory Visit: Payer: Medicaid Other | Attending: Internal Medicine

## 2019-08-03 DIAGNOSIS — Z20822 Contact with and (suspected) exposure to covid-19: Secondary | ICD-10-CM

## 2019-08-04 LAB — NOVEL CORONAVIRUS, NAA: SARS-CoV-2, NAA: DETECTED — AB

## 2019-08-09 ENCOUNTER — Encounter: Payer: Self-pay | Admitting: Physician Assistant

## 2019-09-20 ENCOUNTER — Ambulatory Visit (INDEPENDENT_AMBULATORY_CARE_PROVIDER_SITE_OTHER): Payer: Medicaid Other | Admitting: Medical

## 2019-09-20 ENCOUNTER — Encounter: Payer: Self-pay | Admitting: Cardiology

## 2019-09-20 ENCOUNTER — Other Ambulatory Visit: Payer: Self-pay | Admitting: Medical

## 2019-09-20 ENCOUNTER — Other Ambulatory Visit: Payer: Self-pay

## 2019-09-20 VITALS — BP 112/78 | HR 90 | Ht 65.0 in | Wt 179.8 lb

## 2019-09-20 DIAGNOSIS — R079 Chest pain, unspecified: Secondary | ICD-10-CM | POA: Diagnosis not present

## 2019-09-20 DIAGNOSIS — E785 Hyperlipidemia, unspecified: Secondary | ICD-10-CM | POA: Diagnosis not present

## 2019-09-20 DIAGNOSIS — I2510 Atherosclerotic heart disease of native coronary artery without angina pectoris: Secondary | ICD-10-CM

## 2019-09-20 DIAGNOSIS — Z720 Tobacco use: Secondary | ICD-10-CM

## 2019-09-20 DIAGNOSIS — Z9861 Coronary angioplasty status: Secondary | ICD-10-CM

## 2019-09-20 DIAGNOSIS — I10 Essential (primary) hypertension: Secondary | ICD-10-CM | POA: Diagnosis not present

## 2019-09-20 MED ORDER — ISOSORBIDE MONONITRATE ER 60 MG PO TB24: 60.0000 mg | ORAL_TABLET | Freq: Every day | ORAL | 1 refills | Status: DC

## 2019-09-20 NOTE — Patient Instructions (Signed)
Medication Instructions:  Increase Imdur to 60 mg daily   *If you need a refill on your cardiac medications before your next appointment, please call your pharmacy*   Testing/Procedures: Your physician has requested that you have a lexiscan myoview. A cardiac stress test is a cardiological test that measures the heart's ability to respond to external stress in a controlled clinical environment. The stress response is induced by intravenous pharmacological stimulation.     Follow-Up: At [facility], you and your health needs are our priority.  As part of our continuing mission to provide you with exceptional heart care, we have created designated Provider Care Teams.  These Care Teams include your primary Cardiologist (physician) and Advanced Practice Providers (APPs -  Physician Assistants and Nurse Practitioners) who all work together to provide you with the care you need, when you need it.  We recommend signing up for the patient portal called "MyChart".  Sign up information is provided on this After Visit Summary.  MyChart is used to connect with patients for Virtual Visits (Telemedicine).  Patients are able to view lab/test results, encounter notes, upcoming appointments, etc.  Non-urgent messages can be sent to your provider as well.   To learn more about what you can do with MyChart, go to [email or web address].    Your next appointment:   4-6 week(s)  The format for your next appointment:   In Person  Provider:   K. Italy Hilty, MD

## 2019-09-20 NOTE — Progress Notes (Signed)
Cardiology Office Note    Date:  09/20/2019   ID:  John Mcintyre, DOB May 06, 1971, MRN [id number]  PCP:  Elisabeth Cara, PA-C  Cardiologist:  Pixie Casino, MD  Electrophysiologist:  None   Chief Complaint: back and chest pain  History of Present Illness:   John Mcintyre is a 49 y.o. male with history of CAD (most recent cath in July 2017 showing total occlusion of RCA with left to right collateral from circumflex, 40% stenosed circumflex and 95% stenosed proximal LAD treated with DES and EF 40-45%), HLD, HTN, tobacco use, and history of polio who is being seen for follow-up and back pain.   The patient is followed by Dr. Debara Pickett. Patient had a Myoview Stress test 07/2017 which showed an intermediate study reviewed by Dr. Debara Pickett who did not appreciate significant ischemia and recommended medical tx.The patient previously seen by [facility] for polio and postpolio syndrome with MSK pain.   Patient was last seen 06/30/19 for hypertension recently started on amlodipine 5 mg daily. He reported mild chest discomfort on activity and Imdur was increased.  Patient returns today with complaints of back pain and chest pain. He says for the last 2 weeks he has had increased back and muscle pain. 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 He does not move around or walk much. He feels his pain is better after walking around. He takes ibuprofen which improves the pain. He also reports intermittent chest pain. Some days are worse than others. It comes and goes lasting about 3-5 minutes. It is left sided and non-radiating. Says it feels like a tightness and sometimes sharp. He sometimes feels sob with the pain. Last night he had an episode at rest, 6/10 and took NTG which improved the pain but did not completely resolve the pain. Denies recent illness, fever, chills, lower leg edema or orthopnea. He has seen [facility] in the past but not  recently.    Past Medical History:  Diagnosis Date  . Coronary artery disease   . Hypercholesteremia   . Hypertension   . Polio     Past Surgical History:  Procedure Laterality Date  . CARDIAC CATHETERIZATION N/A 12/23/2015   Procedure: Left Heart Cath and Coronary Angiography;  Surgeon: Burnell Blanks, MD;  Location: [facility];  Service: Cardiovascular;  Laterality: N/A;  . CARDIAC CATHETERIZATION N/A 12/23/2015   Procedure: Coronary Stent Intervention;  Surgeon: Burnell Blanks, MD;  Location: [facility];  Service: Cardiovascular;  Laterality: N/A;  . CORONARY STENT PLACEMENT  12/23/2015   Successful PTCA/DES x 1 proximal LAD  . HIP SURGERY Left 1987   for polio, not sure what was done  . KNEE SURGERY Left 1987   for polio    Current Medications: Current Meds  Medication Sig  . amLODipine (NORVASC) 5 MG tablet Take 1 tablet (5 mg total) by mouth daily.  [name] aspirin EC 81 MG tablet Take 81 mg by mouth daily.  [name] ibuprofen (ADVIL) 800 MG tablet Take 800 mg by mouth every 6 (six) hours as needed for moderate pain.   . isosorbide mononitrate (IMDUR) 30 MG 24 hr tablet Take 1 tablet (30 mg total) by mouth daily.  [name] lisinopril (ZESTRIL) 20 MG tablet Take 1 tablet (20 mg total) by mouth daily.  [name] lovastatin (MEVACOR) 20 MG tablet TAKE 1 TABLET BY MOUTH EVERYDAY AT BEDTIME  . metoprolol tartrate (LOPRESSOR) 50 MG tablet  Take 1 tablet (50 mg total) by mouth 2 (two) times daily. 50MG  IN THE AM AND 25MG  (1/2 TAB) IN THE PM  . nitroGLYCERIN (NITROSTAT) 0.4 MG SL tablet Place 1 tablet (0.4 mg total) under the tongue every 5 (five) minutes as needed for chest pain.  . [DISCONTINUED] methocarbamol (ROBAXIN) 500 MG tablet Take 1 tablet (500 mg total) by mouth 2 (two) times daily.  . [DISCONTINUED] sodium chloride (OCEAN) 0.65 % SOLN nasal spray Place 1 spray into both nostrils as needed for congestion.    Allergies:   Poractant alfa, Pork-derived products, and  Tape   Social History   Socioeconomic History  . Marital status: Married    Spouse name: Not on file  . Number of children: 3  . Years of education: 2 years of college  . Highest education level: Not on file  Occupational History  . Occupation:  Tobacco Use  . Smoking status: Current Every Day Smoker    Packs/day: 1.00    Types: Cigarettes  . Smokeless tobacco: Never Used  . Tobacco comment: ultra lights  Substance and Sexual Activity  . Alcohol use: Yes    Comment: occ  . Drug use: No  . Sexual activity: Not on file  Other Topics Concern  . Not on file  Social History Narrative   Lives at home with wife and three children.   Right-handed.   5 cups tea per day.   Social Determinants of Health   Financial Resource Strain:   . Difficulty of Paying Living Expenses:   Food Insecurity:   . Worried About in the Last Year:   . Media planner in the Last Year:   Transportation Needs:   . Programme researcher, broadcasting/film/video (Medical):   Barista Lack of Transportation (Non-Medical):   Physical Activity:   . Days of Exercise per Week:   . Minutes of Exercise per Session:   Stress:   . Feeling of Stress :   Social Connections:   . Frequency of Communication with Friends and Family:   . Frequency of Social Gatherings with Friends and Family:   . Attends Religious Services:   . Active Member of Clubs or Organizations:   . Attends Freight forwarder Meetings:   [name] Marital Status:      Family History:  The patient's *family history includes Heart failure in his father; Hypertension in his mother.  ROS:   Please see the history of present illness. Otherwise, review of systems is positive for All other systems are reviewed and otherwise negative.    EKGs/Labs/Other Studies Reviewed:    Studies reviewed are outlined and summarized above. Reports included below if pertinent.  Myoview stress test 07/2017  Nuclear stress EF: 42%. Distal apical anteroseptal  akinesis.  The left ventricular ejection fraction is moderately decreased (30-44%).  There was no ST segment deviation noted during stress.  Defect 1: There is a medium defect of moderate severity present in the mid anteroseptal and apical anterior location.  Findings consistent with prior myocardial infarction with peri-infarct ischemia. Mild increase in size of area involved when compared to prior study. Prior LAD stent.  This is an intermediate risk study.   [name], MD  [facility] 11/2015  Prox RCA lesion, 100 %stenosed.  Prox Cx lesion, 40 %stenosed.  Dist Cx lesion, 40 %stenosed.  There is mild left ventricular systolic dysfunction.  LV end diastolic pressure is normal.  The left ventricular ejection fraction is  45-50% by visual estimate.  There is no mitral valve regurgitation.  A drug eluting stent was successfully placed.  Prox LAD lesion, 95 %stenosed.  Post intervention, there is a 0% residual stenosis.   1. Double vessel CAD with unstable angina.  2. Chronic occlusion of the proximal RCA with filling of the mid and distal vessel from left to right collaterals.  3. Severe stenosis proximal LAD 4. Moderate proximal Circumflex stenosis 5. Successful PTCA/DES x 1 proximal LAD 6. Mild LV systolic dysfunction  Recommendations: Will continue ASA, Plavix for one year. Continue beta blocker and statin. Would consider changing to high dose Lipitor at discharge. Smoking cessation.     EKG:  EKG is ordered today, personally reviewed, demonstrating NSR, HR 90 bpm, TWI aVL  Recent Labs: 03/24/2019: ALT 28; BUN 8; Creatinine, Ser 0.70; Hemoglobin 14.7; Platelets 215; Potassium 3.5; Sodium 138  Recent Lipid Panel    Component Value Date/Time   CHOL 158 12/18/2015 1016   TRIG 302 (H) 12/18/2015 1016   HDL 25 (L) 12/18/2015 1016   CHOLHDL 6.3 (H) 12/18/2015 1016   VLDL 60 (H) 12/18/2015 1016   LDLCALC 73 12/18/2015 1016    PHYSICAL EXAM:    VS:  BP 112/78    Pulse 90   Ht 5\' 5"  (1.651 m)   Wt 179 lb 12.8 oz (81.6 kg)   BMI 29.92 kg/m   BMI: Body mass index is 29.92 kg/m.  GEN: Well nourished, well developed, in no acute distress HEENT: normocephalic, atraumatic Neck: no JVD, carotid bruits, or masses Cardiac: RRR; no murmurs, rubs, or gallops, no edema  Respiratory:  clear to auscultation bilaterally, normal work of breathing GI: soft, nontender, nondistended, + BS MS: no deformity or atrophy Skin: warm and dry, no rash Neuro:  Alert and Oriented x 3, Strength and sensation are intact, follows commands Psych: euthymic mood, full affect  Wt Readings from Last 3 Encounters:  09/20/19 179 lb 12.8 oz (81.6 kg)  06/30/19 174 lb (78.9 kg)  03/24/19 170 lb (77.1 kg)     ASSESSMENT & PLAN:   CAD s/p LAD DES 2017 - Cath in 2017 showed chronic total occlusion of RCA with filling of the mid and distal vessel from left to right collaterals - Imdur increased at last visit to whole tablet of Imdur - Patient reports intermittent chest pain improved with NTG - EKG unchanged today - He had a Myoview stress test in 2019 which was intermittent risk with no significant ischemia. Will repeat stress test - Continue Aspirin, BB, and statin - CMET and CBC today  HTN - amlodipine 5 mg re-started last visit - pressure good today - continue lisinopril 20 mg daily, Lopressor 50 mg twice daily.  - Increase Imdur to 60 mg daily - check CMET  HLD - lovastatin 20 mg daily - LDL 73 in 2017 - re-check lipid panel  Postpolio syndrome - previously saw [facility] - Recommended follow-up given muscle and nerve pain  Tobacco use - patient previously tried stopping with Chantix - recommend cessation  Disposition: F/u with Dr.Hilty 4-6 weeks   Medication Adjustments/Labs and Tests Ordered: Current medicines are reviewed at length with the patient today.  Concerns regarding medicines are outlined above. Medication changes, Labs and  Tests ordered today are summarized above and listed in the Patient Instructions accessible in Encounters.   Signed, Allyana Vogan 2018  09/20/2019 3:00 PM    [facility] [address] [place], [place], [place]  [address] Phone: [phone]; Fax: [phone]

## 2019-09-21 ENCOUNTER — Other Ambulatory Visit: Payer: Self-pay | Admitting: Adult Health

## 2019-09-21 LAB — COMPREHENSIVE METABOLIC PANEL
ALT: 20 IU/L (ref 0–44)
AST: 20 IU/L (ref 0–40)
Albumin/Globulin Ratio: 1.6 (ref 1.2–2.2)
Albumin: 4.3 g/dL (ref 4.0–5.0)
Alkaline Phosphatase: 98 IU/L (ref 39–117)
BUN/Creatinine Ratio: 18 (ref 9–20)
BUN: 11 mg/dL (ref 6–24)
Bilirubin Total: <0.2 mg/dL (ref 0.0–1.2)
CO2: 23 mmol/L (ref 20–29)
Calcium: 9.6 mg/dL (ref 8.7–10.2)
Chloride: 105 mmol/L (ref 96–106)
Creatinine, Ser: 0.61 mg/dL — ABNORMAL LOW (ref 0.76–1.27)
GFR calc Af Amer: 137 mL/min/{1.73_m2} (ref >59)
GFR calc non Af Amer: 119 mL/min/{1.73_m2} (ref >59)
Globulin, Total: 2.7 g/dL (ref 1.5–4.5)
Glucose: 70 mg/dL (ref 65–99)
Potassium: 4.3 mmol/L (ref 3.5–5.2)
Sodium: 140 mmol/L (ref 134–144)
Total Protein: 7 g/dL (ref 6.0–8.5)

## 2019-09-21 LAB — CBC
Hematocrit: 42.8 % (ref 37.5–51.0)
Hemoglobin: 14.4 g/dL (ref 13.0–17.7)
MCH: 30.4 pg (ref 26.6–33.0)
MCHC: 33.6 g/dL (ref 31.5–35.7)
MCV: 90 fL (ref 79–97)
Platelets: 217 10*3/uL (ref 150–450)
RBC: 4.74 x10E6/uL (ref 4.14–5.80)
RDW: 13.9 % (ref 11.6–15.4)
WBC: 18.1 10*3/uL — ABNORMAL HIGH (ref 3.4–10.8)

## 2019-09-21 LAB — LIPID PANEL
Chol/HDL Ratio: 6.8 ratio — ABNORMAL HIGH (ref 0.0–5.0)
Cholesterol, Total: 218 mg/dL — ABNORMAL HIGH (ref 100–199)
HDL: 32 mg/dL — ABNORMAL LOW (ref >39)
LDL Chol Calc (NIH): 105 mg/dL — ABNORMAL HIGH (ref 0–99)
Triglycerides: 473 mg/dL — ABNORMAL HIGH (ref 0–149)
VLDL Cholesterol Cal: 81 mg/dL — ABNORMAL HIGH (ref 5–40)

## 2019-09-21 MED ORDER — LISINOPRIL 20 MG PO TABS: 20.0000 mg | ORAL_TABLET | Freq: Every day | ORAL | 11 refills | Status: DC

## 2019-09-21 NOTE — Telephone Encounter (Signed)
*  STAT* If patient is at the pharmacy, call can be transferred to refill team.   1. Which medications need to be refilled? (please list name of each medication and dose if known) lisinopril (ZESTRIL) 20 MG tablet  2. Which pharmacy/location (including street and city if local pharmacy) is medication to be sent to? [id number] - [place], [place] - [address]  3. Do they need a 30 day or 90 day supply? 30 day

## 2019-09-21 NOTE — Telephone Encounter (Signed)
Rx(s) sent to pharmacy electronically.  

## 2019-09-26 ENCOUNTER — Encounter: Payer: Self-pay | Admitting: *Deleted

## 2019-09-26 NOTE — Telephone Encounter (Signed)
-----   Message from Cadence David Stall, PA-C sent at 09/21/2019  1:38 PM EDT ----- Please call patient and tell him that BMET looked good.  Lipid panel/cholesterol labs- inform the patient that his bad cholesterol is high and we will increase his Lovastatin to 40 mg daily. Triglycerides are high but there are not many good options given insurance will not cover lovaza/vascepa and want to stay away from fenofibrate given post-polio syndrome and muscle pain.  CBC- White count was elevated (which might suggest infectious process). On my interview yesterday he denied fever, chills, or recent illness. Please ask if he is having any of these sxs. Recommend he see his PCP to further investigate elevated white count.  Thanks

## 2019-09-26 NOTE — Telephone Encounter (Signed)
Unable to reach pt or leave a message: no voice mail

## 2019-10-04 ENCOUNTER — Telehealth (HOSPITAL_COMMUNITY): Payer: Self-pay

## 2019-10-04 NOTE — Telephone Encounter (Signed)
Encounter complete. 

## 2019-10-06 ENCOUNTER — Ambulatory Visit (HOSPITAL_COMMUNITY)
Admission: RE | Admit: 2019-10-06 | Payer: Medicaid Other | Source: Ambulatory Visit | Attending: Medical | Admitting: Medical

## 2019-10-12 ENCOUNTER — Telehealth (HOSPITAL_COMMUNITY): Payer: Self-pay

## 2019-10-12 NOTE — Telephone Encounter (Signed)
Encounter complete. 

## 2019-10-17 NOTE — Telephone Encounter (Signed)
This encounter was created in error - please disregard.

## 2019-10-18 ENCOUNTER — Other Ambulatory Visit: Payer: Self-pay

## 2019-10-18 ENCOUNTER — Ambulatory Visit (HOSPITAL_COMMUNITY)
Admission: RE | Admit: 2019-10-18 | Discharge: 2019-10-18 | Disposition: A | Discharge status: Home / Self Care | Payer: Medicaid Other | Source: Ambulatory Visit | Attending: Cardiology | Admitting: Cardiology

## 2019-10-18 DIAGNOSIS — R079 Chest pain, unspecified: Secondary | ICD-10-CM | POA: Diagnosis not present

## 2019-10-18 LAB — MYOCARDIAL PERFUSION IMAGING
LV dias vol: 106 mL (ref 62–150)
LV sys vol: 57 mL
Peak HR: 100 {beats}/min
Rest HR: 68 {beats}/min
SDS: 2
SRS: 5
SSS: 7
TID: 1.16

## 2019-10-18 MED ORDER — TECHNETIUM TC 99M TETROFOSMIN IV KIT
10.2000 | PACK | Freq: Once | INTRAVENOUS | Status: AC | PRN
  Administered 2019-10-18: 10.2 via INTRAVENOUS
  Filled 2019-10-18: qty 11

## 2019-10-18 MED ORDER — TECHNETIUM TC 99M TETROFOSMIN IV KIT
32.0000 | PACK | Freq: Once | INTRAVENOUS | Status: AC | PRN
  Administered 2019-10-18: 32 via INTRAVENOUS
  Filled 2019-10-18: qty 32

## 2019-10-18 MED ORDER — REGADENOSON 0.4 MG/5ML IV SOLN
0.4000 mg | Freq: Once | INTRAVENOUS | Status: AC
  Administered 2019-10-18: 0.4 mg via INTRAVENOUS

## 2019-10-19 ENCOUNTER — Encounter: Payer: Self-pay | Admitting: Internal Medicine

## 2019-10-19 ENCOUNTER — Other Ambulatory Visit: Payer: Self-pay

## 2019-10-19 ENCOUNTER — Ambulatory Visit (INDEPENDENT_AMBULATORY_CARE_PROVIDER_SITE_OTHER): Payer: Medicaid Other | Admitting: Internal Medicine

## 2019-10-19 VITALS — BP 110/66 | HR 83 | Temp 96.9°F | Ht 65.0 in | Wt 174.0 lb

## 2019-10-19 DIAGNOSIS — G4489 Other headache syndrome: Secondary | ICD-10-CM

## 2019-10-19 DIAGNOSIS — E785 Hyperlipidemia, unspecified: Secondary | ICD-10-CM | POA: Diagnosis not present

## 2019-10-19 DIAGNOSIS — G14 Postpolio syndrome: Secondary | ICD-10-CM | POA: Diagnosis not present

## 2019-10-19 DIAGNOSIS — I2510 Atherosclerotic heart disease of native coronary artery without angina pectoris: Secondary | ICD-10-CM

## 2019-10-19 DIAGNOSIS — R079 Chest pain, unspecified: Secondary | ICD-10-CM | POA: Diagnosis not present

## 2019-10-19 MED ORDER — LOVASTATIN 40 MG PO TABS: 40.0000 mg | ORAL_TABLET | Freq: Every day | ORAL | 3 refills | Status: DC

## 2019-10-19 NOTE — Progress Notes (Signed)
OFFICE NOTE  Chief Complaint:  Follow-up Myoview  Primary Care Physician: Piedad Climes, [place], PA-C  HPI:  John Mcintyre is a pleasant 49 year old male [place] He says for almost all of his life he had polio and this affected his legs significantly causing weakness in the need to use crutches to walk. 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 Recently he's been having worsening pain in his upper chest shoulders and down both arms. Some the pain is in the back of his neck and even radiates to the right half of his face. While some of those symptoms certainly sound neurologic, he also has some associated fatigue and shortness of breath. He is a smoker and has a history of hypertension and dyslipidemia which is not been well controlled. He was recently started on lovastatin. Triglycerides were noted to be elevated greater than 700 and recently came down to the 300s. His father has a history of heart disease and sounds like he died from heart failure in his 58s. I had the pleasure seeing John Mcintyre back in the office today. He underwent a nuclear stress test which was interpreted as low risk. There was a very small distal anteroapical fixed defect which was suggestive of possible scar. EF was low normal at 52%. No reversible ischemia was seen. He reports no further significant symptoms. Subsequently he presented with worsening chest pain and was referred by Norma Fredrickson, NP for cardiac catheterization in 11/2015. 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 There was a CTO of the RCA with left to right collaterals and severe proximal LAD stenosis. He had successful PCI to the proximal LAD and was chest pain free thereafter.   04/20/2016  He was seen in the ER yesterday with recurrent chest pain for the past 2 days which is similar to his prior anginal symptoms. He reports compliance with his medications, but did not take it this morning.  BP was 174/113 on admission. He was seen in the office in 12/2015 and his lisinopril was increased to 10 mg daily. Initial troponin was negative. Labs indicate a mildly elevated WBC count at 11.3. CXR unremarkable. EKG shows NSR without ischemic changes. He ruled out for acute MI and I increased his lisinopril to 20 mg daily. He said he try to get to the pharmacy for that prescription, but was somewhat confused that I wanted him to take twice the dose that he currently takes. This morning he took 10 mg of lisinopril blood pressure was 152/90. He denies any further chest pain. He took Aleve over-the-counter and reports improvement in his neck pain and shoulder pain. He continues to have difficulty ambulating, particularly with worsening weakness of his legs secondary to polio.  10/15/2016  John Mcintyre returns for follow-up. He was seen in the ER last night and discharged early this morning for chest pain. He recently called our office about this. He describes pain all across his chest and upper back and into his arms. This pain is completely different than the pain he had prior to his cardiac stent placement. 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 Troponin was negative. He was discharged for follow-up with [name]. He feels that his pain may be related to using crutches. 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 He may have neuropathic or musculoskeletal pain related to that. He is continuing on aspirin and Plavix which is more than adequate treatment for his coronary disease. He also complained of visual changes today including difficulty reading. I suspect this is presbyopia however at times he feels that he has some numbness on one half of his face and other unusual symptoms. He does report very poor sleep at night as well. He denies any snoring or apnea but gets less  than 6 hours of sleep. He also tried Chantix for smoking cessation but said that it worsened his pain and he stopped taking it. He also felt that it made it more difficult for him to sleep.  01/22/2017  John Mcintyre returns for follow-up. He reports she's had no further chest pain. He continues to have problems with leg weakness secondary to history of polio. He has had significant muscle wasting particularly in the left hip and more difficulty walking. He is inquiring about bracing. I referred him to neurology however the wait was so long he did not take the appointment. He is more than 1 year out from stenting and I believe could be taken off of Plavix. Blood pressure is at goal today.  06/29/2017  John Mcintyre today in follow-up.  He reports some chest pain.  This is not similar to his pain with his recent stent implantation in July 2017.  He completed a year of Plavix and now is on aspirin.  Some of his symptoms seem to be worse with muscle use and he as required braces in order to support his upper body.  At his last office visit he inquired about additional bracing and I referred him to Dr. Hermelinda Medicus with physical medicine and rehabilitation.  Unfortunately he never made that appointment.  He is also been referred to Dr. Allena Katz with neurology twice but canceled the appointment since they were scheduled 3 months out.  He said he either missed the appointments or did not have enough patients for them.  That being said he is asking for a referral again today.  He also says he is having trouble with his vision and I advised him to contact an ophthalmologist.  01/28/2018  John Mcintyre recently has been experiencing some chest pain.  He presented to the emergency department yesterday and had point tenderness.  He was able to point to it was in the area around the left nipple.  It is distinctly different than the pain he had previously with his prior stent which included pain across his chest and shortness of  breath with exertion.  He has had no more symptoms like that.  He took nitroglycerin without any real improvement.  He was given aspirin in the ER.  EKG was negative for ischemia.  Troponin was negative x2.  Other studies were negative and low risk for pulmonary embolus.  He has worked with rehab/physiatry for bracing, unfortunately was not able to afford any good options.  He reports he is considering edentia lesion which was recommended due to poor dentition and multiple caries.  10/19/2019  John Mcintyre returns today for follow-up of a Myoview stress test.  He was recently seen by Cadence Fransico Michael, PA-C, who wished to evaluate him further for chest pain.  He has a history of coronary artery disease and was found to have significant proximal LAD disease which was stented as well as an occluded distal right coronary with left to right collaterals.  In 2019 he had an abnormal  Myoview stress test which showed distal anterior and apical fixed perfusion defect suggestive of scar.  EF was in the low 40% range.  He is repeat Myoview in April of this year showed a very similar finding with improvement in LVEF however to the upper 40s, however the fixed defect persisted.  There was no evidence of reversible ischemia.  He is nitrate was increased to 60 mg daily.  In addition he had repeat labs which showed his LDL cholesterol was still well above target of less than 70 and therefore his lovastatin was also increased from 20 to 40 mg.  He is not clear if he is actually taking that dose.  His other complaints today included chronic headache for which she takes ibuprofen 800 mg at least twice a day.  I suspect he may have a rebound headache.  In addition he has been having chronic shoulder pain and feels like he could benefit from physical therapy.  He also continues to report issues with erectile dysfunction however cannot take PDE-5 inhibitors in addition to his nitrates.  PMHx:  Past Medical History:  Diagnosis Date  .  Coronary artery disease   . Hypercholesteremia   . Hypertension   . Polio     Past Surgical History:  Procedure Laterality Date  . CARDIAC CATHETERIZATION N/A 12/23/2015   Procedure: Left Heart Cath and Coronary Angiography;  Surgeon: Burnell Blanks, MD;  Location: [place] [facility];  Service: Cardiovascular;  Laterality: N/A;  . CARDIAC CATHETERIZATION N/A 12/23/2015   Procedure: Coronary Stent Intervention;  Surgeon: Burnell Blanks, MD;  Location: [place] [facility];  Service: Cardiovascular;  Laterality: N/A;  . CORONARY STENT PLACEMENT  12/23/2015   Successful PTCA/DES x 1 proximal LAD  . HIP SURGERY Left 1987   for polio, not sure what was done  . KNEE SURGERY Left 1987   for polio    FAMHx:  Family History  Problem Relation Age of Onset  . Hypertension Mother   . Heart failure Father     SOCHx:   reports that he has been smoking cigarettes. He has been smoking about 1.00 pack per day. He has never used smokeless tobacco. He reports current alcohol use. He reports that he does not use drugs.  ALLERGIES:  Allergies  Allergen Reactions  . Poractant Alfa Other (See Comments)    Patient does not eat pork because of his culture  . Pork-Derived Products Other (See Comments)    Patient does not eat pork because of his culture  . Tape Other (See Comments)    Skin is sensitive    ROS: Pertinent items noted in HPI and remainder of comprehensive ROS otherwise negative.  HOME MEDS: Current Outpatient Medications  Medication Sig Dispense Refill  . aspirin EC 81 MG tablet Take 81 mg by mouth daily.    [name] ibuprofen (ADVIL) 800 MG tablet Take 800 mg by mouth every 6 (six) hours as needed for moderate pain.     . isosorbide mononitrate (IMDUR) 60 MG 24 hr tablet Take 1 tablet (60 mg total) by mouth daily. 90 tablet 1  . lisinopril (ZESTRIL) 20 MG tablet Take 1 tablet (20 mg total) by mouth daily. 30 tablet 11  . lovastatin (MEVACOR) 40 MG tablet Take 1 tablet (40  mg total) by mouth at bedtime. 90 tablet 3  . metoprolol tartrate (LOPRESSOR) 50 MG tablet Take 1 tablet (50 mg total) by mouth 2 (two) times daily. 50MG  IN THE AM AND 25MG  (  1/2 TAB) IN THE PM 45 tablet 6  . nitroGLYCERIN (NITROSTAT) 0.4 MG SL tablet Place 1 tablet (0.4 mg total) under the tongue every 5 (five) minutes as needed for chest pain. 25 tablet 3  . amLODipine (NORVASC) 5 MG tablet Take 1 tablet (5 mg total) by mouth daily. 180 tablet 3   No current facility-administered medications for this visit.    LABS/IMAGING: No results found for this or any previous visit (from the past 48 hour(s)). MYOCARDIAL PERFUSION IMAGING  Result Date: 10/18/2019  The left ventricular ejection fraction is mildly decreased (45-54%).  Nuclear stress EF: 47%.  There was no ST segment deviation noted during stress.  No T wave inversion was noted during stress.  Findings consistent with prior myocardial infarction.  This is an intermediate risk study.  Impression: 1. There is a fixed, medium size (5-8% of LV), moderate to severe perfusion defect present in the mid anterior and apical anterior segments consistent with prior infarction. This is consistent the patient's history of prior LAD stent. 2. There is no ischemia present. 3. LVEF is mildly reduced, 47%. 4. This is an intermediate risk study. 5. There is no change from the prior study.    WEIGHTS: Wt Readings from Last 3 Encounters:  10/19/19 174 lb (78.9 kg)  10/18/19 179 lb (81.2 kg)  09/20/19 179 lb 12.8 oz (81.6 kg)    VITALS: BP 110/66   Pulse 83   Temp (!) 96.9 F (36.1 C)   Ht 5\' 5"  (1.651 m)   Wt 174 lb (78.9 kg)   SpO2 98%   BMI 28.96 kg/m   EXAM: General appearance: alert and no distress Neck: no carotid bruit and no JVD Lungs: clear to auscultation bilaterally Heart: regular rate and rhythm Abdomen: soft, non-tender; bowel sounds normal; no masses,  no organomegaly Extremities: Peripheral muscle wasting secondary to  polio Pulses: 2+ and symmetric Skin: Skin color, texture, turgor normal. No rashes or lesions Neurologic: Grossly normal Psych: Pleasant  EKG: Deferred  ASSESSMENT: 1. Chest pain- atypical, sounds neuropathic in nature- recent PCI to the LAD (11/2015) and noted CTO of the RCA with left to right collaterals 2. Hypertension 3. Dyslipidemia 4. Tobacco abuse 5. Family history of coronary disease 6. Polio (with possible post-polio syndrome) 7. Suspect NSAID rebound headache 8. Erectile dysfunction  PLAN: 1.   John Mcintyre had a low risk Myoview stress test.  This seems unchanged compared to a study in 2019.  He seems a little better after increasing his isosorbide and I would continue that.  He is not a candidate for PDE-5 inhibitors due to his long-acting nitrate use.  His lovastatin should be increased from 20 to 40 mg daily.  He will check to see if that is the dose he is taking and should have repeat lipids in 3 to 4 months.  With regards to his chronic headaches I have referred him to the headache center in [place] on [address].  Hopefully they can help him wean off NSAIDs if that is the issue.  He had previously seen a neurologist at [facility]y and [facility].  Follow-up with me annually or sooner as necessary.  [facility], MD, [facility], FACP  [place]  [facility]  Medical Director of the [facility]  [facility] Diplomate of the American Board of Clinical Lipidology Attending Cardiologist  Direct Dial: [phone]  Fax: [phone]  Website:  [email or web address]  [phone]  Mija Effertz 10/19/2019, 5:35 PM

## 2019-10-19 NOTE — Patient Instructions (Signed)
Medication Instructions:  Dr. Rennis Golden recommends that you take lovastatin 40mg  daily. Continue all other current medications.   *If you need a refill on your cardiac medications before your next appointment, please call your pharmacy*   Lab Work: Fasting lipid panel in 3 months  If you have labs (blood work) drawn today and your tests are completely normal, you will receive your results only by: MyChart Message (if you have MyChart) OR . A paper copy in the mail If you have any lab test that is abnormal or we need to change your treatment, we will call you to review the results.   Testing/Procedures: NONE   Follow-Up: At [facility], you and your health needs are our priority.  As part of our continuing mission to provide you with exceptional heart care, we have created designated Provider Care Teams.  These Care Teams include your primary Cardiologist (physician) and Advanced Practice Providers (APPs -  Physician Assistants and Nurse Practitioners) who all work together to provide you with the care you need, when you need it.  We recommend signing up for the patient portal called "MyChart".  Sign up information is provided on this After Visit Summary.  MyChart is used to connect with patients for Virtual Visits (Telemedicine).  Patients are able to view lab/test results, encounter notes, upcoming appointments, etc.  Non-urgent messages can be sent to your provider as well.   To learn more about what you can do with MyChart, go to [facility].    Your next appointment:   12 month(s)  The format for your next appointment:   Either In Person or Virtual  Provider:   You may see [email or web address], MD or one of the following Advanced Practice Providers on your designated Care Team:    Chrystie Nose, PA-C  Azalee Course, Micah Flesher or   [place], Judy Pimple    Other Instructions  [facility] [address] [place] [facility] [phone]  Dr. [phone] has  referred you to home health for physical therapy evaluation

## 2019-10-20 ENCOUNTER — Telehealth: Payer: Self-pay | Admitting: Internal Medicine

## 2019-10-20 NOTE — Telephone Encounter (Signed)
Follow Up:; ° ° °Returning your call. °

## 2019-10-20 NOTE — Telephone Encounter (Signed)
Lm for John Mcintyre with [facility]

## 2019-10-20 NOTE — Telephone Encounter (Signed)
Follow Up:   Loren from [facility] called. She wanted you to know that the order received yesterday for pt's home health service is declined at this time.

## 2019-11-15 ENCOUNTER — Other Ambulatory Visit: Payer: Self-pay | Admitting: Internal Medicine

## 2019-11-15 MED ORDER — METOPROLOL TARTRATE 50 MG PO TABS: 50.0000 mg | ORAL_TABLET | Freq: Two times a day (BID) | ORAL | 6 refills | Status: DC

## 2019-11-15 NOTE — Telephone Encounter (Signed)
*  STAT* If patient is at the pharmacy, call can be transferred to refill team.   1. Which medications need to be refilled? (please list name of each medication and dose if known)  Metoprolol  2. Which pharmacy/location (including street and city if local pharmacy) is medication to be sent to? [facility]  3. Do they need a 30 day or 90 day supply? 30 days

## 2019-12-25 ENCOUNTER — Ambulatory Visit: Payer: Medicaid Other | Attending: Internal Medicine

## 2019-12-25 DIAGNOSIS — Z23 Encounter for immunization: Secondary | ICD-10-CM

## 2019-12-25 NOTE — Progress Notes (Signed)
   Covid-19 Vaccination Clinic  Name:  Plumer Mittelstaedt    MRN: [id number] DOB: 15-Jun-1970  12/25/2019  Mr. Mctigue was observed post Covid-19 immunization for 15 minutes without incident. He was provided with Vaccine Information Sheet and instruction to access the V-Safe system.   Mr. Springs was instructed to call 911 with any severe reactions post vaccine: [name] Difficulty breathing  . Swelling of face and throat  . A fast heartbeat  . A bad rash all over body  . Dizziness and weakness   Immunizations Administered    Name Date Dose VIS Date Route   Pfizer COVID-19 Vaccine 12/25/2019  1:28 PM 0.3 mL 07/26/2018 Intramuscular   Manufacturer: [employer], [employer]   Lot: [id number]   NDC: [address]-3

## 2020-01-23 ENCOUNTER — Ambulatory Visit: Payer: Self-pay

## 2020-02-18 ENCOUNTER — Other Ambulatory Visit: Payer: Self-pay | Admitting: Internal Medicine

## 2020-02-18 DIAGNOSIS — Z9861 Coronary angioplasty status: Secondary | ICD-10-CM

## 2020-03-24 ENCOUNTER — Other Ambulatory Visit: Payer: Self-pay | Admitting: Medical

## 2020-03-24 DIAGNOSIS — I2510 Atherosclerotic heart disease of native coronary artery without angina pectoris: Secondary | ICD-10-CM

## 2020-03-26 ENCOUNTER — Other Ambulatory Visit: Payer: Self-pay

## 2020-06-20 ENCOUNTER — Telehealth: Payer: Self-pay | Admitting: Internal Medicine

## 2020-06-20 NOTE — Telephone Encounter (Signed)
Pt c/o of Chest Pain: STAT if CP now or developed within 24 hours  1. Are you having CP right now? Burning in chest sometimes, shoulder,back and arm pain  2. Are you experiencing any other symptoms (ex. SOB, nausea, vomiting, sweating)? A little short of breath sometimes  3. How long have you been experiencing CP? About a month  4. Is your CP continuous or coming and going? comes and goes  Yes  5. Have you taken Nitroglycerin? Yes- have an appt on Tuesday(06-25-20, I offered him appt for tomorrow(06-21-20)?

## 2020-06-20 NOTE — Telephone Encounter (Signed)
Spoke to patient he stated he has been having chest pain,pain in left arm and shoulder off and on for the past 1 month.No pain at present.Appointment offered tomorrow but he refused due to weather.Advised to keep appointment already scheduled with Dr.Hilty 06/25/20 at 11:30 am.Advised to go to ED if needed.

## 2020-06-21 NOTE — Telephone Encounter (Signed)
Thanks Cheryl.  Dr. H 

## 2020-06-25 ENCOUNTER — Ambulatory Visit: Payer: Medicaid Other | Admitting: Internal Medicine

## 2020-07-11 ENCOUNTER — Encounter: Payer: Self-pay | Admitting: Internal Medicine

## 2020-07-11 ENCOUNTER — Ambulatory Visit: Payer: Medicaid Other | Admitting: Medical

## 2020-07-11 NOTE — Progress Notes (Deleted)
Cardiology Office Note   Date:  07/11/2020   ID:  John Mcintyre, DOB [birth year], MRN [id number]  PCP:  Burnis Medin, PA-C  Cardiologist:  John Nose, MD EP: None  No chief complaint on file.     History of Present Illness: John Mcintyre is a 50 y.o. male with a PMH of CAD s/p PCI/DES to pLAD in 2017, HTN, HLD, erectile dysfunction, polio, tobacco abuse who presents for ***  He was last evaluated by cardiology at an outpatient visit with Dr. Rennis Mcintyre 10/19/19 in follow-up of recent chest pain, for which he underwent a NST 09/2019 which showed a fixed defect, though no reversible ischemia, with EF estimated to be 47%, essentially unchanged from previous NST in 2019. His imdur was increased to 60mg  daily prior to his follow-up visit and chest pain was improved. His lovastatin was increased from 20 to 40mg  and he was recommended for repeat FLP in 3-4 months. His last LHC was in 2017 where he underwent PCI/DES to LAD and was noted to have CTO of the RCA with left to right collaterals, as well as 40% proximal and distal LCx which was medically managed  Patient contacted our office 06/20/20 to report intermittent chest pain for the past month. He was initially scheduled to see Dr. 2018 06/25/20, however appears he cancelled that visit. Now here today for follow-up.   1. Chest pain in patient with CAD s/p PCI/DES to LAD in 2017:  - Will update an echocardiogram to evaluate LV function - Continue aspirin and statin - Continue imdur, metoprolol, and amlodipine for anti-anginal effects  2. HTN: BP *** today - Continue amlodipine, metoprolol, and imdur  3. HLD: LDL 105 and trilycerides 473 08/2019. Due for repeat lipids  - Continue lovastatin vs switch to atorvastatin ***  4. Erectile dysfunction: continues to not be a candidate for PDE-5 inhibitors due to imdur use -  5. Tobacco abuse: still smoking *** - Continue to encourage cessation  Past Medical History:  Diagnosis Date   . Coronary artery disease   . Hypercholesteremia   . Hypertension   . Polio     Past Surgical History:  Procedure Laterality Date  . CARDIAC CATHETERIZATION N/A 12/23/2015   Procedure: Left Heart Cath and Coronary Angiography;  Surgeon: 09/2019, MD;  Location: [facility] INVASIVE CV LAB;  Service: Cardiovascular;  Laterality: N/A;  . CARDIAC CATHETERIZATION N/A 12/23/2015   Procedure: Coronary Stent Intervention;  Surgeon: [facility], MD;  Location: [facility] INVASIVE CV LAB;  Service: Cardiovascular;  Laterality: N/A;  . CORONARY STENT PLACEMENT  12/23/2015   Successful PTCA/DES x 1 proximal LAD  . HIP SURGERY Left 1987   for polio, not sure what was done  . KNEE SURGERY Left 1987   for polio     Current Outpatient Medications  Medication Sig Dispense Refill  . amLODipine (NORVASC) 5 MG tablet Take 1 tablet (5 mg total) by mouth daily. 180 tablet 3  . aspirin EC 81 MG tablet Take 81 mg by mouth daily.    Kathleene Hazel ibuprofen (ADVIL) 800 MG tablet Take 800 mg by mouth every 6 (six) hours as needed for moderate pain.     . isosorbide mononitrate (IMDUR) 60 MG 24 hr tablet TAKE 1 TABLET BY MOUTH EVERY DAY 90 tablet 2  . lisinopril (ZESTRIL) 20 MG tablet Take 1 tablet (20 mg total) by mouth daily. 30 tablet 11  . lovastatin (MEVACOR) 40 MG tablet Take 1 tablet (40 mg  total) by mouth at bedtime. 90 tablet 3  . metoprolol tartrate (LOPRESSOR) 50 MG tablet Take 1 tablet (50 mg total) by mouth 2 (two) times daily. 50MG  IN THE AM AND 25MG  (1/2 TAB) IN THE PM 45 tablet 6  . nitroGLYCERIN (NITROSTAT) 0.4 MG SL tablet DISSOLVE 1 TABLET UNDER TONGUE EVERY 5 MINUTES AS NEEDED FOR CHEST PAIN *MAX 3 TABLETS* 25 tablet 6   No current facility-administered medications for this visit.    Allergies:   Poractant alfa, Pork-derived products, and Tape    Social History:  The patient  reports that he has been smoking cigarettes. He has been smoking about 1.00 pack per day. He has never used  smokeless tobacco. He reports current alcohol use. He reports that he does not use drugs.   Family History:  The patient's ***family history includes Heart failure in his father; Hypertension in his mother.    ROS:  Please see the history of present illness.   Otherwise, review of systems are positive for {NONE DEFAULTED:18576::"none"}.   All other systems are reviewed and negative.    PHYSICAL EXAM: VS:  There were no vitals taken for this visit. , BMI There is no height or weight on file to calculate BMI. GEN: Well nourished, well developed, in no acute distress HEENT: normal Neck: no JVD, carotid bruits, or masses Cardiac: ***RRR; no murmurs, rubs, or gallops,no edema  Respiratory:  clear to auscultation bilaterally, normal work of breathing GI: soft, nontender, nondistended, + BS MS: no deformity or atrophy Skin: warm and dry, no rash Neuro:  Strength and sensation are intact Psych: euthymic mood, full affect   EKG:  EKG {ACTION; IS/IS ordered today. The ekg ordered today demonstrates ***   Recent Labs: 09/20/2019: ALT 20; BUN 11; Creatinine, Ser 0.61; Hemoglobin 14.4; Platelets 217; Potassium 4.3; Sodium 140    Lipid Panel    Component Value Date/Time   CHOL 218 (H) 09/20/2019 1618   TRIG 473 (H) 09/20/2019 1618   HDL 32 (L) 09/20/2019 1618   CHOLHDL 6.8 (H) 09/20/2019 1618   CHOLHDL 6.3 (H) 12/18/2015 1016   VLDL 60 (H) 12/18/2015 1016   LDLCALC 105 (H) 09/20/2019 1618      Wt Readings from Last 3 Encounters:  10/19/19 174 lb (78.9 kg)  10/18/19 179 lb (81.2 kg)  09/20/19 179 lb 12.8 oz (81.6 kg)      Other studies Reviewed: Additional studies/ records that were reviewed today include:   NST 09/2019:  The left ventricular ejection fraction is mildly decreased (45-54%).  Nuclear stress EF: 47%.  There was no ST segment deviation noted during stress.  No T wave inversion was noted during stress.  Findings consistent with prior myocardial  infarction.  This is an intermediate risk study.   Impression:  1. There is a fixed, medium size (5-8% of LV), moderate to severe perfusion defect present in the mid anterior and apical anterior segments consistent with prior infarction. This is consistent the patient's history of prior LAD stent.  2. There is no ischemia present.  3. LVEF is mildly reduced, 47%.  4. This is an intermediate risk study.  5. There is no change from the prior study.   LHC 2017:  Prox RCA lesion, 100 %stenosed.  Prox Cx lesion, 40 %stenosed.  Dist Cx lesion, 40 %stenosed.  There is mild left ventricular systolic dysfunction.  LV end diastolic pressure is normal.  The left ventricular ejection fraction is 45-50% by visual estimate.  There  is no mitral valve regurgitation.  A drug eluting stent was successfully placed.  Prox LAD lesion, 95 %stenosed.  Post intervention, there is a 0% residual stenosis.   1. Double vessel CAD with unstable angina.  2. Chronic occlusion of the proximal RCA with filling of the mid and distal vessel from left to right collaterals.  3. Severe stenosis proximal LAD 4. Moderate proximal Circumflex stenosis 5. Successful PTCA/DES x 1 proximal LAD 6. Mild LV systolic dysfunction  Recommendations: Will continue ASA, Plavix for one year. Continue beta blocker and statin. Would consider changing to high dose Lipitor at discharge. Smoking cessation.    ASSESSMENT AND PLAN:  1.  ***   Current medicines are reviewed at length with the patient today.  The patient {ACTIONS; HAS/DOES NOT HAVE:19233} concerns regarding medicines.  The following changes have been made:  {PLAN; NO CHANGE:13088:s}  Labs/ tests ordered today include: *** No orders of the defined types were placed in this encounter.    Disposition:   FU with *** in {gen number 9-56:213086} {Days to years:10300}  Signed, Beatriz Stallion, PA-C  07/11/2020 6:16 AM

## 2020-07-11 NOTE — Telephone Encounter (Signed)
Error

## 2020-07-22 ENCOUNTER — Encounter: Payer: Self-pay | Admitting: Medical

## 2020-07-22 ENCOUNTER — Other Ambulatory Visit: Payer: Self-pay

## 2020-07-22 ENCOUNTER — Ambulatory Visit (INDEPENDENT_AMBULATORY_CARE_PROVIDER_SITE_OTHER): Payer: Medicaid Other | Admitting: Medical

## 2020-07-22 VITALS — BP 132/86 | HR 83 | Ht 65.0 in | Wt 176.0 lb

## 2020-07-22 DIAGNOSIS — I10 Essential (primary) hypertension: Secondary | ICD-10-CM | POA: Diagnosis not present

## 2020-07-22 DIAGNOSIS — I2510 Atherosclerotic heart disease of native coronary artery without angina pectoris: Secondary | ICD-10-CM

## 2020-07-22 DIAGNOSIS — M79602 Pain in left arm: Secondary | ICD-10-CM

## 2020-07-22 DIAGNOSIS — Z720 Tobacco use: Secondary | ICD-10-CM

## 2020-07-22 DIAGNOSIS — E7800 Pure hypercholesterolemia, unspecified: Secondary | ICD-10-CM | POA: Diagnosis not present

## 2020-07-22 DIAGNOSIS — E785 Hyperlipidemia, unspecified: Secondary | ICD-10-CM

## 2020-07-22 MED ORDER — NITROGLYCERIN 0.4 MG SL SUBL: SUBLINGUAL_TABLET | SUBLINGUAL | 6 refills | Status: DC

## 2020-07-22 MED ORDER — AMLODIPINE BESYLATE 5 MG PO TABS
5.0000 mg | ORAL_TABLET | Freq: Every day | ORAL | 3 refills | Status: DC
Start: 2020-07-22 — End: 2021-05-07

## 2020-07-22 NOTE — Progress Notes (Signed)
Cardiology Office Note   Date:  07/24/2020   ID:  John Mcintyre, DOB February 17, 1971, MRN [id number]  PCP:  Burnis Medin, PA-C  Cardiologist:  Chrystie Nose, MD EP: None  Chief Complaint  Patient presents with  . arm and shoulder pain      History of Present Illness: John Mcintyre is a 50 y.o. male with a PMH of CAD s/p PCI/ to LAD in 2017 in addition to CTO to RCA with L>R collateral, HTN, HLD, polio, and tobacco abuse, who presents for the evaluation of left hand/back pain.   He was last evaluated by cardiology at an outpatient visit with Dr. Rennis Golden 09/2019, at which time he was seen in follow-up of recent chest pain. He had a stress test just prior to this which showed EF 47%, fixed defect c/w history of prior LAD stent, no ischemia, and no significant change from previous. His last LHC in 2017 showed 100% RCA stenosis with left to right collaterals, severe pLAD stenosis managed with PCI/DES, and 40% p/dLCx stenosis which was medically managed. He was recommended to increase lovastatin from 20mg  to 40mg  daily and repeat lipids in 3-4 months.   He presents today for the evaluation of left arm/hand pain. He reports this occurs most mornings upon waking, though resolves within a few minutes. He has occasional tingling in his hands throughout the day. This is particularly bothersome to him in that he is reliant on his upper body for ambulation given his polio history and need for crutches to walk. He has no radiation of pain to his chest. He denies exertional chest pain. He denies SOB after starting an inhaler recently ). He has some mild LE edema which is generally improved with elevation of legs. No complaints of dizziness, lightheadedness, syncope, or palpitations. He asks about options for managing his ED and we discussed limitations given need for long-acting nitro. Additionally he is anticipating some dental work in the coming months though is unsure if any teeth will be  pulled or how many if needed.    Past Medical History:  Diagnosis Date  . Coronary artery disease   . Hypercholesteremia   . Hypertension   . Polio     Past Surgical History:  Procedure Laterality Date  . CARDIAC CATHETERIZATION N/A 12/23/2015   Procedure: Left Heart Cath and Coronary Angiography;  Surgeon: Markus Daft, MD;  Location: [facility] INVASIVE CV LAB;  Service: Cardiovascular;  Laterality: N/A;  . CARDIAC CATHETERIZATION N/A 12/23/2015   Procedure: Coronary Stent Intervention;  Surgeon: [facility], MD;  Location: [facility] INVASIVE CV LAB;  Service: Cardiovascular;  Laterality: N/A;  . CORONARY STENT PLACEMENT  12/23/2015   Successful PTCA/DES x 1 proximal LAD  . HIP SURGERY Left 1987   for polio, not sure what was done  . KNEE SURGERY Left 1987   for polio     Current Outpatient Medications  Medication Sig Dispense Refill  . aspirin EC 81 MG tablet Take 81 mg by mouth daily.    Kathleene Hazel ibuprofen (ADVIL) 800 MG tablet Take 800 mg by mouth every 6 (six) hours as needed for moderate pain.     . isosorbide mononitrate (IMDUR) 60 MG 24 hr tablet TAKE 1 TABLET BY MOUTH EVERY DAY 90 tablet 2  . lisinopril (ZESTRIL) 20 MG tablet Take 1 tablet (20 mg total) by mouth daily. 30 tablet 11  . lovastatin (MEVACOR) 40 MG tablet Take 1 tablet (40 mg total) by mouth at bedtime.  90 tablet 3  . metoprolol tartrate (LOPRESSOR) 50 MG tablet Take 1 tablet (50 mg total) by mouth 2 (two) times daily. 50MG  IN THE AM AND 25MG  (1/2 TAB) IN THE PM 45 tablet 6  . amLODipine (NORVASC) 5 MG tablet Take 1 tablet (5 mg total) by mouth daily. 90 tablet 3  . nitroGLYCERIN (NITROSTAT) 0.4 MG SL tablet DISSOLVE 1 TABLET UNDER TONGUE EVERY 5 MINUTES AS NEEDED FOR CHEST PAIN *MAX 3 TABLETS* 25 tablet 6   No current facility-administered medications for this visit.    Allergies:   Poractant alfa, Pork-derived products, and Tape    Social History:  The patient  reports that he has been smoking  cigarettes. He has been smoking about 1.00 pack per day. He has never used smokeless tobacco. He reports current alcohol use. He reports that he does not use drugs.   Family History:  The patient's family history includes Heart failure in his father; Hypertension in his mother.    ROS:  Please see the history of present illness.   Otherwise, review of systems are positive for none.   All other systems are reviewed and negative.    PHYSICAL EXAM: VS:  BP 132/86 (BP Location: Left Arm, Patient Position: Sitting)   Pulse 83   Ht 5\' 5"  (1.651 m)   Wt 176 lb (79.8 kg)   SpO2 97%   BMI 29.29 kg/m  , BMI Body mass index is 29.29 kg/m. GEN: Well nourished, well developed, in no acute distress HEENT: sclera anicteric Neck: no JVD, carotid bruits, or masses Cardiac: RRR; no murmurs, rubs, or gallops, 1+ RLE edema  Respiratory:  clear to auscultation bilaterally, normal work of breathing GI: soft, nontender, nondistended, + BS MS: no deformity or atrophy Skin: warm and dry, no rash Neuro:  Strength and sensation are intact Psych: euthymic mood, full affect   EKG:  EKG is ordered today. The ekg ordered today demonstrates sinus rhythm with rate 83 bpm, isolated TWI in aVL, no STE/D, no significant change from previous   Recent Labs: 09/20/2019: ALT 20; BUN 11; Creatinine, Ser 0.61; Hemoglobin 14.4; Platelets 217; Potassium 4.3; Sodium 140    Lipid Panel    Component Value Date/Time   CHOL 218 (H) 09/20/2019 1618   TRIG 473 (H) 09/20/2019 1618   HDL 32 (L) 09/20/2019 1618   CHOLHDL 6.8 (H) 09/20/2019 1618   CHOLHDL 6.3 (H) 12/18/2015 1016   VLDL 60 (H) 12/18/2015 1016   LDLCALC 105 (H) 09/20/2019 1618      Wt Readings from Last 3 Encounters:  07/22/20 176 lb (79.8 kg)  10/19/19 174 lb (78.9 kg)  10/18/19 179 lb (81.2 kg)      Other studies Reviewed: Additional studies/ records that were reviewed today include:   Left heart catheterization 2017:  Prox RCA lesion, 100  %stenosed.  Prox Cx lesion, 40 %stenosed.  Dist Cx lesion, 40 %stenosed.  There is mild left ventricular systolic dysfunction.  LV end diastolic pressure is normal.  The left ventricular ejection fraction is 45-50% by visual estimate.  There is no mitral valve regurgitation.  A drug eluting stent was successfully placed.  Prox LAD lesion, 95 %stenosed.  Post intervention, there is a 0% residual stenosis.   1. Double vessel CAD with unstable angina.  2. Chronic occlusion of the proximal RCA with filling of the mid and distal vessel from left to right collaterals.  3. Severe stenosis proximal LAD 4. Moderate proximal Circumflex stenosis 5. Successful PTCA/DES  x 1 proximal LAD 6. Mild LV systolic dysfunction  Recommendations: Will continue ASA, Plavix for one year. Continue beta blocker and statin. Would consider changing to high dose Lipitor at discharge. Smoking cessation.   NST 09/2019:  The left ventricular ejection fraction is mildly decreased (45-54%).  Nuclear stress EF: 47%.  There was no ST segment deviation noted during stress.  No T wave inversion was noted during stress.  Findings consistent with prior myocardial infarction.  This is an intermediate risk study.   Impression:  1. There is a fixed, medium size (5-8% of LV), moderate to severe perfusion defect present in the mid anterior and apical anterior segments consistent with prior infarction. This is consistent the patient's history of prior LAD stent.  2. There is no ischemia present.  3. LVEF is mildly reduced, 47%.  4. This is an intermediate risk study.  5. There is no change from the prior study.      ASSESSMENT AND PLAN:   1. Pain in the left hand/arm/upper back: suspect MSK etiology given symptoms are present upon waking and resolve within a few minutes, though he does not occasional tingling in his hands throughout the day.  - Will place referral to ortho to further evaluate his  symptoms.   2. CAD s/p PCI/DES to LAD in 2017: no complaints of chest pain recently.  - Continue aspirin and statin - Continue metoprolol and imdur  3. HTN: BP 132/86 today, generally with SBP in the 130s-140s. He states he has not been taking amlodipine.  - Will restart amlodipine 5mg  daily as BP is generally above goal at recent office visits - Continue metoprolol and lisinopril   4. HLD: LDL 105 08/2019, lovastatin was increased to 40mg  09/2019. He is due for blood work with his PCP in the coming weeks.  - Will place order for FLP to be drawn with his upcoming blood work.  - Low threshold to transition to high-intensity statin if LDL remains above goal.   5. Tobacco abuse: still smoking - Continue to encourage cessation      Current medicines are reviewed at length with the patient today.  The patient does not have concerns regarding medicines.  The following changes have been made:  As above  Labs/ tests ordered today include:   Orders Placed This Encounter  Procedures  . Lipid panel  . AMB referral to orthopedics  . EKG 12-Lead     Disposition:   FU with Dr. in 6 months  Signed, 10/2019, PA-C  07/24/2020 2:30 PM

## 2020-07-22 NOTE — Patient Instructions (Signed)
Medication Instructions:   -Restart Amlodipine 5mg  daily.  *If you need a refill on your cardiac medications before your next appointment, please call your pharmacy*   Lab Work: Your physician recommends that you return for lab work in: Lipids at PCP  If you have labs (blood work) drawn today and your tests are completely normal, you will receive your results only by: MyChart Message (if you have MyChart) OR . A paper copy in the mail If you have any lab test that is abnormal or we need to change your treatment, we will call you to review the results.   Follow-Up: At [facility], you and your health needs are our priority.  As part of our continuing mission to provide you with exceptional heart care, we have created designated Provider Care Teams.  These Care Teams include your primary Cardiologist (physician) and Advanced Practice Providers (APPs -  Physician Assistants and Nurse Practitioners) who all work together to provide you with the care you need, when you need it.  We recommend signing up for the patient portal called "MyChart".  Sign up information is provided on this After Visit Summary.  MyChart is used to connect with patients for Virtual Visits (Telemedicine).  Patients are able to view lab/test results, encounter notes, upcoming appointments, etc.  Non-urgent messages can be sent to your provider as well.   To learn more about what you can do with MyChart, go to [facility].    Your next appointment:   6 month(s)  The format for your next appointment:   In Person  Provider:   [email or web address], MD   Other Instructions Referral placed for orthopedic care.

## 2020-07-24 ENCOUNTER — Encounter: Payer: Self-pay | Admitting: Medical

## 2020-07-28 ENCOUNTER — Other Ambulatory Visit: Payer: Self-pay | Admitting: Internal Medicine

## 2020-07-31 ENCOUNTER — Other Ambulatory Visit: Payer: Self-pay

## 2020-07-31 ENCOUNTER — Encounter: Payer: Self-pay | Admitting: Family Medicine

## 2020-07-31 ENCOUNTER — Ambulatory Visit (INDEPENDENT_AMBULATORY_CARE_PROVIDER_SITE_OTHER): Payer: Medicaid Other | Admitting: Family Medicine

## 2020-07-31 DIAGNOSIS — M25362 Other instability, left knee: Secondary | ICD-10-CM | POA: Diagnosis not present

## 2020-07-31 DIAGNOSIS — M542 Cervicalgia: Secondary | ICD-10-CM | POA: Diagnosis not present

## 2020-07-31 DIAGNOSIS — M546 Pain in thoracic spine: Secondary | ICD-10-CM

## 2020-07-31 DIAGNOSIS — M2351 Chronic instability of knee, right knee: Secondary | ICD-10-CM

## 2020-07-31 NOTE — Progress Notes (Signed)
Office Visit Note   Patient: John Mcintyre           Date of Birth: 23-Sep-1970           MRN: [id number] Visit Date: 07/31/2020 Requested by: Beatriz Stallion., PA-C [address] [place],  [place] [address] PCP: Piedad Climes, [place], [place]  Subjective: Chief Complaint  Patient presents with  . Lower Back - Pain    Sometimes he has pain, states that he has more pain in the neck than in the lower back  . Neck - Pain    States that he has neck pain that radiates into the shoulder, NKI, onset x 2 years ago    HPI: He is here with neck and upper back pain.  He has a history of polio as a child.  He has chronic bilateral knee instability as a result of his weakness.  He has to use arm crutches for support with ambulation.  He thinks that this has gradually led to neck and upper back pain.  He also drives a taxi, and does a lot of computer work.  These things also seem to aggravate his neck.  Denies any radicular pain.  He takes ibuprofen occasionally.               ROS:   All other systems were reviewed and are negative.  Objective: Vital Signs: There were no vitals taken for this visit.  Physical Exam:  General:  Alert and oriented, in no acute distress. Pulm:  Breathing unlabored. Psy:  Normal mood, congruent affect.  Neck: He has good range of motion with negative Spurling's test.  He has multiple tender trigger points in the cervical paraspinous muscles and in the upper back, trapezius muscles.  Upper extremity strength and reflexes are normal. Knees have slight instability with varus and valgus stress, especially the left knee.   Imaging: No results found.  Assessment & Plan: 1.  Myofascial neck and upper back pain -We will try physical therapy for myofascial release techniques.  2.  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     Procedures: No  procedures performed        PMFS History: Patient Active Problem List   Diagnosis Date Noted  . Pre-operative cardiovascular examination 09/14/2017  . Pulmonary nodule 09/14/2017  . 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 Left lumbar radiculopathy 08/25/2017  . Neuropathic pain 10/15/2016  . Hypertensive urgency 04/19/2016  . CAD S/P LAD DES 12/23/15 12/23/2015  . Tobacco abuse 12/23/2015  . Essential hypertension 10/11/2014  . Hypercholesteremia 10/11/2014  . Chest pain 10/11/2014  . Polio    Past Medical History:  Diagnosis Date  . Coronary artery disease   . Hypercholesteremia   . Hypertension   . Polio     Family History  Problem Relation Age of Onset  . Hypertension Mother   . Heart failure Father     Past Surgical History:  Procedure Laterality Date  . CARDIAC CATHETERIZATION N/A 12/23/2015   Procedure: Left Heart Cath and Coronary Angiography;  Surgeon: Kathleene Hazel, MD;  Location: [facility] INVASIVE CV LAB;  Service: Cardiovascular;  Laterality: N/A;  . CARDIAC CATHETERIZATION N/A 12/23/2015   Procedure: Coronary Stent Intervention;  Surgeon: Kathleene Hazel, MD;  Location: [facility] INVASIVE CV LAB;  Service: Cardiovascular;  Laterality: N/A;  . CORONARY STENT PLACEMENT  12/23/2015  Successful PTCA/DES x 1 proximal LAD  . HIP SURGERY Left 1987   for polio, not sure what was done  . KNEE SURGERY Left 1987   for polio   Social History   Occupational History  . Occupation: Media planner  Tobacco Use  . Smoking status: Current Every Day Smoker    Packs/day: 1.00    Types: Cigarettes  . Smokeless tobacco: Never Used  . Tobacco comment: ultra lights  Vaping Use  . Vaping Use: Never used  Substance and Sexual Activity  . Alcohol use: Yes    Comment: occ  . Drug use: No  . Sexual activity: Not on file

## 2020-08-01 NOTE — Progress Notes (Signed)
Emailed order, ov notes and demographics to [employer] with [employer].

## 2020-08-12 ENCOUNTER — Ambulatory Visit: Payer: Medicaid Other | Attending: Family Medicine | Admitting: Physical Therapy

## 2020-08-26 ENCOUNTER — Ambulatory Visit: Payer: Medicaid Other

## 2020-08-27 ENCOUNTER — Other Ambulatory Visit: Payer: Self-pay | Admitting: Internal Medicine

## 2020-09-04 ENCOUNTER — Telehealth: Payer: Self-pay | Admitting: *Deleted

## 2020-09-04 NOTE — Telephone Encounter (Signed)
   [facility] Pre-operative Risk Assessment    Patient Name: John Mcintyre  DOB: 1970/09/26  MRN: [id number]   HEARTCARE STAFF: - Please ensure there is not already an duplicate clearance open for this procedure. - Under Visit Info/Reason for Call, type in Other and utilize the format Clearance MM/DD/YY or Clearance TBD. Do not use dashes or single digits. - If request is for dental extraction, please clarify the # of teeth to be extracted.  Request for surgical clearance:  1. What type of surgery is being performed? REMOVAL OF REMAINING 17 TEETH, ALVELOPLASTY MAXILLA AND MANDIBLE  2. When is this surgery scheduled? TBD   3. What type of clearance is required (medical clearance vs. Pharmacy clearance to hold med vs. Both)? MEDICAL  4. Are there any medications that need to be held prior to surgery and how long? ASA    5. Practice name and name of physician performing surgery? SCOTT JENSEN, D.M.D   6. What is the office phone number? [phone]   7.   What is the office fax number? [phone]  8.   Anesthesia type (None, local, MAC, general) ? GENERAL   Julaine Hua 09/04/2020, 10:39 AM  _________________________________________________________________   (provider comments below)

## 2020-09-04 NOTE — Telephone Encounter (Signed)
Hi Dr. Rennis Golden,  Mr. John Mcintyre is scheduled to have remaining 17 teeth extracted as well as alveoloplasty of maxilla and mandible under general anesthesia and will need to hold Aspirin. He has a history of CAD s/p PCI to LAD in 2017 and know CTO of RCA with collaterals. Patient was recently seen by [place] in 07/2020 at which time he noted some left hand/arm/uppper back pain that was felt to be MSK in nature but no chest pain. Can you please comment on how long Aspirin can be held prior to this dental procedure?  Please route response back to P CV DIV PREOP.  Thank you! Vonya Ohalloran

## 2020-09-04 NOTE — Telephone Encounter (Signed)
   Patient Name: John Mcintyre  DOB: April 24, 1971  MRN: [id number]   Primary Cardiologist: Chrystie Nose, MD  Chart reviewed as part of pre-operative protocol coverage. Patient was recently seen by Judy Pimple, PA-C, on 07/22/2020 at which time he was stable from cardiac standpoint. Patient was contacted today for further pre-op evaluation and reported doing well since last office visit. No chest pain, significant short of breath, orthopnea, PND, palpitations, near syncope/syncope. Patient has polio and activity is limited because of this but is able to ambulate with crutches without any angina. Per Revised Cardiac Risk Index, considered low risk given history of CAD with 0.9% chance of adverse cardiac events. Given past medical history and time since last visit, based on ACC/AHA guidelines, [name] would be at acceptable risk for the planned procedure without further cardiovascular testing.   Per Dr. Rennis Golden, OK to hold Aspirin for 7 days prior to procedure. This should be restarted as soon as it is safe from a bleeding standpoint following procedure.   I will route this recommendation to the requesting party via Epic fax function and remove from pre-op pool.  Please call with questions.  Corrin Parker, PA-C 09/04/2020, 2:42 PM

## 2020-09-04 NOTE — Telephone Encounter (Signed)
Ok to hold aspirin 7 days prior to procedure and restart when safe from a bleeding standpoint after  Dr Rexene Edison

## 2020-09-30 ENCOUNTER — Other Ambulatory Visit: Payer: Self-pay | Admitting: Internal Medicine

## 2020-11-09 ENCOUNTER — Other Ambulatory Visit: Payer: Self-pay | Admitting: Internal Medicine

## 2020-11-09 DIAGNOSIS — Z9861 Coronary angioplasty status: Secondary | ICD-10-CM

## 2020-11-20 NOTE — H&P (Signed)
HISTORY AND PHYSICAL  John Mcintyre is a 51 y.o. male patient with CC: Bad teeth, cant eat.  No diagnosis found.  Past Medical History:  Diagnosis Date   Coronary artery disease    Hypercholesteremia    Hypertension    Polio     No current facility-administered medications for this encounter.   Current Outpatient Medications  Medication Sig Dispense Refill   amLODipine (NORVASC) 5 MG tablet Take 1 tablet (5 mg total) by mouth daily. 90 tablet 3   aspirin EC 81 MG tablet Take 81 mg by mouth daily.     ibuprofen (ADVIL) 800 MG tablet Take 400 mg by mouth every 6 (six) hours as needed for moderate pain.     isosorbide mononitrate (IMDUR) 60 MG 24 hr tablet TAKE 1 TABLET BY MOUTH EVERY DAY (Patient taking differently: Take 60 mg by mouth daily.) 90 tablet 3   lisinopril (ZESTRIL) 20 MG tablet TAKE 1 TABLET BY MOUTH EVERY DAY (Patient taking differently: Take 20 mg by mouth daily.) 90 tablet 1   lovastatin (MEVACOR) 40 MG tablet Take 1 tablet (40 mg total) by mouth at bedtime. (Patient taking differently: Take 40 mg by mouth daily.) 90 tablet 3   metoprolol tartrate (LOPRESSOR) 50 MG tablet TAKE 1 TABLET (50 MG TOTAL) BY MOUTH 2 (TWO) TIMES DAILY. 50MG  IN THE AM AND 25MG  (1/2 TAB) IN THE PM (Patient taking differently: Take 25-50 mg by mouth 2 (two) times daily. Take 50 MG IN THE AM AND 25MG  IN THE PM) 45 tablet 6   nitroGLYCERIN (NITROSTAT) 0.4 MG SL tablet DISSOLVE 1 TABLET UNDER TONGUE EVERY 5 MINUTES AS NEEDED FOR CHEST PAIN *MAX 3 TABLETS* (Patient taking differently: Place 0.4 mg under the tongue every 5 (five) minutes as needed for chest pain. DISSOLVE 1 TABLET UNDER TONGUE EVERY 5 MINUTES AS NEEDED FOR CHEST PAIN *MAX 3 TABLETS*) 25 tablet 6   Vitamin D, Ergocalciferol, (DRISDOL) 1.25 MG (50000 UNIT) CAPS capsule Take 50,000 capsules by mouth once a week.     Allergies  Allergen Reactions   Poractant Alfa Other (See Comments)    Patient does not eat pork because of his culture    Pork-Derived Products Other (See Comments)    Patient does not eat pork because of his culture   Tape Other (See Comments)    Skin is sensitive   Active Problems:   * No active hospital problems. *  Vitals: There were no vitals taken for this visit. Lab results:No results found for this or any previous visit (from the past 24 hour(s)). Radiology Results: No results found. General appearance: alert, cooperative, and no distress Head: Normocephalic, without obvious abnormality, atraumatic Eyes: negative Nose: Nares normal. Septum midline. Mucosa normal. No drainage or sinus tenderness. Throat: Multiple carious teeth with bone loss. No purulence, fluctuance, edema, trismus. Pharynx clear.  Neck: no adenopathy Resp: clear to auscultation bilaterally Cardio: regular rate and rhythm, S1, S2 normal, no murmur, click, rub or gallop  Assessment: Multiple non-restorable teeth secondary to dental caries, periodontal disease.  Plan:Multiple dental extractions, alveoloplasty. GA. Day surgery.   11/20/2020

## 2020-11-21 ENCOUNTER — Encounter (HOSPITAL_COMMUNITY): Payer: Self-pay | Admitting: Oral Surgery

## 2020-11-21 ENCOUNTER — Other Ambulatory Visit: Payer: Self-pay

## 2020-11-21 NOTE — Progress Notes (Signed)
Patient would like arabic interpreter to ensure understanding of instructions.  His wife only speaks arabic- no english; he wants to make sure she also understands instructions as he will have received anesthesia  Voiced understanding of pre-op instructions: -arrival time of 1140 on Monday 11/25/2020 -NPO after midnight  -shower and do not apply anything on skin -DO NOT smoke DOS -encouraged and educated on smoking cessation  -Medications instructed to take DOS: amlodipine, imdur, lovastatin, metoprolol, nitroglycerin prn -DO NOT take lisinopril DOS -stated he stopped taking aspirin 11/17/2020 -instructed as of today 6/23 to stop taking vitamin D and ibuprofen and any other NSAIDs  Denies any chest pain or shortness of breath at this time  Voiced concern of what he can eat after surgery- instructed to call Dr. Randa Evens office for recommendations  Forwarded chart to anesthesia for review

## 2020-11-22 NOTE — Progress Notes (Signed)
Anesthesia Chart Review: SAME DAY WORK-UP   Case: [id number] Date/Time: 11/25/20 1410   Procedure: DENTAL RESTORATION/EXTRACTIONS   Anesthesia type: General   Pre-op diagnosis: NON RESTORABLE   [facility] ROOM 06 / [facility]   Surgeons: [name], DMD       DISCUSSION: Patient is a 50 year old male scheduled for the above procedure. Arabic interpretor has been arranged for day of surgery.   History includes smoking, CAD (MI; 12/23/15: CTAO pRCA with filling of mid/distal vessel from left-to-right collaterals, severe pLAD stenosis, s/p PTCA/DES), HTN, hypercholesterolemia, Polio. On 09/20/20, home based sleep study ordered by Dr. Su Monks for snoring/chronic fatigue with concern possible OSA.  Preoperative cardiology input outlined by Marjie Skiff, PA-C on 09/04/20: "Chart reviewed as part of pre-operative protocol coverage. Patient was recently seen by Judy Pimple, PA-C, on 07/22/2020 at which time he was stable from cardiac standpoint. Patient was contacted today for further pre-op evaluation and reported doing well since last office visit. No chest pain, significant short of breath, orthopnea, PND, palpitations, near syncope/syncope. Patient has polio and activity is limited because of this but is able to ambulate with crutches without any angina. Per Revised Cardiac Risk Index, considered low risk given history of CAD with 0.9% chance of adverse cardiac events. Given past medical history and time since last visit, based on ACC/AHA guidelines, John Mcintyre would be at acceptable risk for the planned procedure without further cardiovascular testing.   Per Dr. Rennis Golden, OK to hold Aspirin for 7 days prior to procedure. This should be restarted as soon as it is safe from a bleeding standpoint following procedure."   He is a same day work-up, so labs as indicated and anesthesia team evaluation on the day of surgery.   VS: Ht 5\' 5"  (1.651 m)   Wt 77.1 kg   BMI 28.29 kg/m  BP Readings from Last  3 Encounters:  07/22/20 132/86  10/19/19 110/66  09/20/19 112/78   Pulse Readings from Last 3 Encounters:  07/22/20 83  10/19/19 83  09/20/19 90      PROVIDERS: 09/22/19, Piedad Climes, PA-C is PCP [facility]) [facility], MD is pulmonologist [facility]). Last visit 09/20/20. Home sleep study and smoking cessation recommended. Notes that PFTs in September 2020 were "unremarkable". Hilty, October 2020, MD is cardiologist   LABS: For day of surgery as indicated. Cr 0.69,  A1c 5.6% 01/16/20 (CE). WBC 14.2, H/H 15.1/44.7 10/31/19 (CE).      EKG: 07/22/20: NSR   CV: Nuclear stress test 10/18/19: The left ventricular ejection fraction is mildly decreased (45-54%). Nuclear stress EF: 47%. There was no ST segment deviation noted during stress. No T wave inversion was noted during stress. Findings consistent with prior myocardial infarction. This is an intermediate risk study.   Impression: 1. There is a fixed, medium size (5-8% of LV), moderate to severe perfusion defect present in the mid anterior and apical anterior segments consistent with prior infarction. This is consistent the patient's history of prior LAD stent. 2. There is no ischemia present. 3. LVEF is mildly reduced, 47%. 4. This is an intermediate risk study. 5. There is no change from the prior study.   LHC/PCI 12/23/15: Prox RCA lesion, 100 %stenosed. Prox Cx lesion, 40 %stenosed. Dist Cx lesion, 40 %stenosed. There is mild left ventricular systolic dysfunction. LV end diastolic pressure is normal. The left ventricular ejection fraction is 45-50% by visual estimate. There is no mitral valve regurgitation. A drug eluting stent was successfully placed. Prox LAD lesion,  95 %stenosed. Post intervention, there is a 0% residual stenosis.   1. Double vessel CAD with unstable angina. 2. Chronic occlusion of the proximal RCA with filling of the mid and distal vessel from left to right collaterals. 3. Severe stenosis proximal LAD 4.  Moderate proximal Circumflex stenosis 5. Successful PTCA/DES x 1 proximal LAD 6. Mild LV systolic dysfunction    Past Medical History:  Diagnosis Date   Coronary artery disease    Hypercholesteremia    Hypertension    Myocardial infarction [facility])    Polio     Past Surgical History:  Procedure Laterality Date   CARDIAC CATHETERIZATION N/A 12/23/2015   Procedure: Left Heart Cath and Coronary Angiography;  Surgeon: Kathleene Hazel, MD;  Location: [facility] INVASIVE CV LAB;  Service: Cardiovascular;  Laterality: N/A;   CARDIAC CATHETERIZATION N/A 12/23/2015   Procedure: Coronary Stent Intervention;  Surgeon: Kathleene Hazel, MD;  Location: [facility] INVASIVE CV LAB;  Service: Cardiovascular;  Laterality: N/A;   CORONARY STENT PLACEMENT  12/23/2015   Successful PTCA/DES x 1 proximal LAD   HIP SURGERY Left 1987   for polio, not sure what was done   KNEE SURGERY Left 1987   for polio    MEDICATIONS: No current facility-administered medications for this encounter.    amLODipine (NORVASC) 5 MG tablet   aspirin EC 81 MG tablet   ibuprofen (ADVIL) 800 MG tablet   isosorbide mononitrate (IMDUR) 60 MG 24 hr tablet   lisinopril (ZESTRIL) 20 MG tablet   lovastatin (MEVACOR) 40 MG tablet   metoprolol tartrate (LOPRESSOR) 50 MG tablet   nitroGLYCERIN (NITROSTAT) 0.4 MG SL tablet   Vitamin D, Ergocalciferol, (DRISDOL) 1.25 MG (50000 UNIT) CAPS capsule    Shonna Chock, PA-C Surgical Short Stay/Anesthesiology [facility] Phone [phone] [facility] Phone [phone] 11/22/2020 10:31 AM

## 2020-11-22 NOTE — Anesthesia Preprocedure Evaluation (Addendum)
Anesthesia Evaluation    Airway Mallampati: II  TM Distance: >3 FB Neck ROM: Full    Dental  (+) Poor Dentition   Pulmonary Current Smoker and Patient abstained from smoking.,    Pulmonary exam normal breath sounds clear to auscultation       Cardiovascular hypertension, Pt. on medications + CAD and + Past MI  Normal cardiovascular exam Rhythm:Regular Rate:Normal     Neuro/Psych    GI/Hepatic   Endo/Other    Renal/GU      Musculoskeletal   Abdominal   Peds  Hematology   Anesthesia Other Findings   Reproductive/Obstetrics                            Anesthesia Physical Anesthesia Plan  ASA: 3  Anesthesia Plan: General   Post-op Pain Management:    Induction: Intravenous  PONV Risk Score and Plan: 1 and Ondansetron and Treatment may vary due to age or medical condition  Airway Management Planned: Nasal ETT  Additional Equipment:   Intra-op Plan:   Post-operative Plan: Extubation in OR  Informed Consent: I have reviewed the patients History and Physical, chart, labs and discussed the procedure including the risks, benefits and alternatives for the proposed anesthesia with the patient or authorized representative who has indicated his/her understanding and acceptance.     Dental advisory given  Plan Discussed with: CRNA  Anesthesia Plan Comments: (PAT note written 11/22/2020 by Shonna Chock, PA-C. Arabic interpretor to be arranged for day of surgery.  )       Anesthesia Quick Evaluation

## 2020-11-25 ENCOUNTER — Other Ambulatory Visit: Payer: Self-pay

## 2020-11-25 ENCOUNTER — Ambulatory Visit (HOSPITAL_COMMUNITY): Payer: Medicaid Other | Admitting: Vascular Surgery

## 2020-11-25 ENCOUNTER — Ambulatory Visit (HOSPITAL_COMMUNITY)
Admission: RE | Admit: 2020-11-25 | Discharge: 2020-11-25 | Disposition: A | Discharge status: Home / Self Care | Payer: Medicaid Other | Attending: Oral Surgery | Admitting: Oral Surgery

## 2020-11-25 ENCOUNTER — Encounter (HOSPITAL_COMMUNITY)
Admission: RE | Disposition: A | Discharge status: Home / Self Care | Payer: Self-pay | Source: Home / Self Care | Attending: Oral Surgery

## 2020-11-25 ENCOUNTER — Encounter (HOSPITAL_COMMUNITY): Payer: Self-pay | Admitting: Oral Surgery

## 2020-11-25 DIAGNOSIS — Z955 Presence of coronary angioplasty implant and graft: Secondary | ICD-10-CM | POA: Insufficient documentation

## 2020-11-25 DIAGNOSIS — Z91014 Allergy to mammalian meats: Secondary | ICD-10-CM | POA: Insufficient documentation

## 2020-11-25 DIAGNOSIS — Z8612 Personal history of poliomyelitis: Secondary | ICD-10-CM | POA: Insufficient documentation

## 2020-11-25 DIAGNOSIS — Z791 Long term (current) use of non-steroidal anti-inflammatories (NSAID): Secondary | ICD-10-CM | POA: Diagnosis not present

## 2020-11-25 DIAGNOSIS — Z91048 Other nonmedicinal substance allergy status: Secondary | ICD-10-CM | POA: Diagnosis not present

## 2020-11-25 DIAGNOSIS — K029 Dental caries, unspecified: Secondary | ICD-10-CM | POA: Diagnosis not present

## 2020-11-25 DIAGNOSIS — Z7982 Long term (current) use of aspirin: Secondary | ICD-10-CM | POA: Diagnosis not present

## 2020-11-25 DIAGNOSIS — K056 Periodontal disease, unspecified: Secondary | ICD-10-CM | POA: Insufficient documentation

## 2020-11-25 DIAGNOSIS — Z79899 Other long term (current) drug therapy: Secondary | ICD-10-CM | POA: Diagnosis not present

## 2020-11-25 HISTORY — PX: TOOTH EXTRACTION: SHX859

## 2020-11-25 HISTORY — DX: Acute myocardial infarction, unspecified: I21.9

## 2020-11-25 LAB — BASIC METABOLIC PANEL
Anion gap: 10 (ref 5–15)
BUN: 5 mg/dL — ABNORMAL LOW (ref 6–20)
CO2: 21 mmol/L — ABNORMAL LOW (ref 22–32)
Calcium: 9.1 mg/dL (ref 8.9–10.3)
Chloride: 109 mmol/L (ref 98–111)
Creatinine, Ser: 0.7 mg/dL (ref 0.61–1.24)
GFR, Estimated: >60 mL/min (ref >60)
Glucose, Bld: 99 mg/dL (ref 70–99)
Potassium: 3.8 mmol/L (ref 3.5–5.1)
Sodium: 140 mmol/L (ref 135–145)

## 2020-11-25 LAB — CBC
HCT: 41.7 % (ref 39.0–52.0)
Hemoglobin: 13.6 g/dL (ref 13.0–17.0)
MCH: 30 pg (ref 26.0–34.0)
MCHC: 32.6 g/dL (ref 30.0–36.0)
MCV: 91.9 fL (ref 80.0–100.0)
Platelets: 239 10*3/uL (ref 150–400)
RBC: 4.54 MIL/uL (ref 4.22–5.81)
RDW: 13.2 % (ref 11.5–15.5)
WBC: 11.9 10*3/uL — ABNORMAL HIGH (ref 4.0–10.5)
nRBC: 0 % (ref 0.0–0.2)

## 2020-11-25 SURGERY — DENTAL RESTORATION/EXTRACTIONS
Anesthesia: General | Site: Mouth

## 2020-11-25 MED ORDER — CEFAZOLIN SODIUM-DEXTROSE 2-4 GM/100ML-% IV SOLN
2.0000 g | INTRAVENOUS | Status: AC
  Administered 2020-11-25: 2 g via INTRAVENOUS

## 2020-11-25 MED ORDER — OXYCODONE HCL 5 MG/5ML PO SOLN: 5.0000 mg | Freq: Once | ORAL | Status: AC | PRN

## 2020-11-25 MED ORDER — ORAL CARE MOUTH RINSE: 15.0000 mL | Freq: Once | OROMUCOSAL | Status: AC

## 2020-11-25 MED ORDER — LIDOCAINE 2% (20 MG/ML) 5 ML SYRINGE
INTRAMUSCULAR | Status: DC | PRN
  Administered 2020-11-25: 60 mg via INTRAVENOUS

## 2020-11-25 MED ORDER — MEPERIDINE HCL 25 MG/ML IJ SOLN
6.2500 mg | INTRAMUSCULAR | Status: DC | PRN
Start: 2020-11-25 — End: 2020-11-26

## 2020-11-25 MED ORDER — SODIUM CHLORIDE 0.9 % IR SOLN
Status: DC | PRN
  Administered 2020-11-25: 1000 mL

## 2020-11-25 MED ORDER — 0.9 % SODIUM CHLORIDE (POUR BTL) OPTIME
TOPICAL | Status: DC | PRN
  Administered 2020-11-25: 1000 mL

## 2020-11-25 MED ORDER — LIDOCAINE-EPINEPHRINE 2 %-1:100000 IJ SOLN
INTRAMUSCULAR | Status: AC
  Filled 2020-11-25: qty 1

## 2020-11-25 MED ORDER — DEXAMETHASONE SODIUM PHOSPHATE 10 MG/ML IJ SOLN
INTRAMUSCULAR | Status: AC
  Filled 2020-11-25: qty 1

## 2020-11-25 MED ORDER — LIDOCAINE-EPINEPHRINE 2 %-1:100000 IJ SOLN
INTRAMUSCULAR | Status: DC | PRN
  Administered 2020-11-25: 20 mL

## 2020-11-25 MED ORDER — LACTATED RINGERS IV SOLN: INTRAVENOUS | Status: DC

## 2020-11-25 MED ORDER — PROMETHAZINE HCL 25 MG/ML IJ SOLN: 6.2500 mg | INTRAMUSCULAR | Status: DC | PRN

## 2020-11-25 MED ORDER — AMISULPRIDE (ANTIEMETIC) 5 MG/2ML IV SOLN: 10.0000 mg | Freq: Once | INTRAVENOUS | Status: DC | PRN

## 2020-11-25 MED ORDER — OXYCODONE HCL 5 MG PO TABS
ORAL_TABLET | ORAL | Status: AC
  Filled 2020-11-25: qty 1

## 2020-11-25 MED ORDER — OXYMETAZOLINE HCL 0.05 % NA SOLN
NASAL | Status: DC | PRN
  Administered 2020-11-25: 1 via NASAL

## 2020-11-25 MED ORDER — HYDROMORPHONE HCL 1 MG/ML IJ SOLN
INTRAMUSCULAR | Status: AC
  Filled 2020-11-25: qty 1

## 2020-11-25 MED ORDER — CHLORHEXIDINE GLUCONATE 0.12 % MT SOLN
OROMUCOSAL | Status: AC
  Administered 2020-11-25: 15 mL via OROMUCOSAL
  Filled 2020-11-25: qty 15

## 2020-11-25 MED ORDER — FENTANYL CITRATE (PF) 250 MCG/5ML IJ SOLN
INTRAMUSCULAR | Status: AC
  Filled 2020-11-25: qty 5

## 2020-11-25 MED ORDER — HYDRALAZINE HCL 20 MG/ML IJ SOLN
INTRAMUSCULAR | Status: AC
  Filled 2020-11-25: qty 1

## 2020-11-25 MED ORDER — ONDANSETRON HCL 4 MG/2ML IJ SOLN
INTRAMUSCULAR | Status: AC
  Filled 2020-11-25: qty 2

## 2020-11-25 MED ORDER — CHLORHEXIDINE GLUCONATE 0.12 % MT SOLN: 15.0000 mL | Freq: Once | OROMUCOSAL | Status: AC

## 2020-11-25 MED ORDER — ROCURONIUM BROMIDE 10 MG/ML (PF) SYRINGE
PREFILLED_SYRINGE | INTRAVENOUS | Status: AC
  Filled 2020-11-25: qty 10

## 2020-11-25 MED ORDER — OXYMETAZOLINE HCL 0.05 % NA SOLN
NASAL | Status: AC
  Filled 2020-11-25: qty 30

## 2020-11-25 MED ORDER — LIDOCAINE 2% (20 MG/ML) 5 ML SYRINGE
INTRAMUSCULAR | Status: AC
  Filled 2020-11-25: qty 5

## 2020-11-25 MED ORDER — SUGAMMADEX SODIUM 200 MG/2ML IV SOLN: INTRAVENOUS | Status: DC | PRN

## 2020-11-25 MED ORDER — PROPOFOL 10 MG/ML IV BOLUS
INTRAVENOUS | Status: DC | PRN
  Administered 2020-11-25: 140 mg via INTRAVENOUS

## 2020-11-25 MED ORDER — SUGAMMADEX SODIUM 200 MG/2ML IV SOLN
INTRAVENOUS | Status: DC | PRN
  Administered 2020-11-25: 320 mg via INTRAVENOUS

## 2020-11-25 MED ORDER — HYDROMORPHONE HCL 1 MG/ML IJ SOLN
0.2500 mg | INTRAMUSCULAR | Status: DC | PRN
  Administered 2020-11-25 (×2): 0.5 mg via INTRAVENOUS

## 2020-11-25 MED ORDER — AMOXICILLIN 500 MG PO CAPS: 500.0000 mg | ORAL_CAPSULE | Freq: Three times a day (TID) | ORAL | 0 refills | Status: AC

## 2020-11-25 MED ORDER — DEXAMETHASONE SODIUM PHOSPHATE 10 MG/ML IJ SOLN
INTRAMUSCULAR | Status: DC | PRN
  Administered 2020-11-25: 10 mg via INTRAVENOUS

## 2020-11-25 MED ORDER — DEXMEDETOMIDINE (PRECEDEX) IN NS 20 MCG/5ML (4 MCG/ML) IV SYRINGE
PREFILLED_SYRINGE | INTRAVENOUS | Status: DC | PRN
  Administered 2020-11-25 (×2): 8 ug via INTRAVENOUS

## 2020-11-25 MED ORDER — PROPOFOL 10 MG/ML IV BOLUS
INTRAVENOUS | Status: AC
  Filled 2020-11-25: qty 20

## 2020-11-25 MED ORDER — FENTANYL CITRATE (PF) 100 MCG/2ML IJ SOLN
INTRAMUSCULAR | Status: DC | PRN
  Administered 2020-11-25: 50 ug via INTRAVENOUS
  Administered 2020-11-25: 100 ug via INTRAVENOUS

## 2020-11-25 MED ORDER — OXYCODONE-ACETAMINOPHEN 5-325 MG PO TABS: 1.0000 | ORAL_TABLET | ORAL | 0 refills | Status: AC | PRN

## 2020-11-25 MED ORDER — MIDAZOLAM HCL 5 MG/5ML IJ SOLN
INTRAMUSCULAR | Status: DC | PRN
  Administered 2020-11-25: 2 mg via INTRAVENOUS

## 2020-11-25 MED ORDER — ACETAMINOPHEN 10 MG/ML IV SOLN
INTRAVENOUS | Status: AC
  Filled 2020-11-25: qty 100

## 2020-11-25 MED ORDER — ACETAMINOPHEN 10 MG/ML IV SOLN
INTRAVENOUS | Status: DC | PRN
  Administered 2020-11-25: 1000 mg via INTRAVENOUS

## 2020-11-25 MED ORDER — MIDAZOLAM HCL 2 MG/2ML IJ SOLN
INTRAMUSCULAR | Status: AC
  Filled 2020-11-25: qty 2

## 2020-11-25 MED ORDER — HYDRALAZINE HCL 20 MG/ML IJ SOLN
10.0000 mg | Freq: Once | INTRAMUSCULAR | Status: AC
  Administered 2020-11-25: 10 mg via INTRAVENOUS

## 2020-11-25 MED ORDER — CEFAZOLIN SODIUM-DEXTROSE 2-4 GM/100ML-% IV SOLN
INTRAVENOUS | Status: AC
  Filled 2020-11-25: qty 100

## 2020-11-25 MED ORDER — ROCURONIUM BROMIDE 100 MG/10ML IV SOLN
INTRAVENOUS | Status: DC | PRN
  Administered 2020-11-25: 70 mg via INTRAVENOUS

## 2020-11-25 MED ORDER — LISINOPRIL 20 MG PO TABS
20.0000 mg | ORAL_TABLET | Freq: Once | ORAL | Status: AC
  Administered 2020-11-25: 20 mg via ORAL
  Filled 2020-11-25: qty 1

## 2020-11-25 MED ORDER — OXYCODONE HCL 5 MG PO TABS
5.0000 mg | ORAL_TABLET | Freq: Once | ORAL | Status: AC | PRN
  Administered 2020-11-25: 5 mg via ORAL

## 2020-11-25 SURGICAL SUPPLY — 38 items
BLADE SURG 15 STRL LF DISP TIS (BLADE) ×1 IMPLANT
BLADE SURG 15 STRL SS (BLADE) ×2
BUR CROSS CUT FISSURE 1.6 (BURR) ×2 IMPLANT
BUR CROSS CUT FISSURE 1.6MM (BURR) ×1
BUR EGG ELITE 4.0 (BURR) ×2 IMPLANT
BUR EGG ELITE 4.0MM (BURR) ×1
CANISTER SUCT 3000ML PPV (MISCELLANEOUS) ×3 IMPLANT
COVER SURGICAL LIGHT HANDLE (MISCELLANEOUS) ×3 IMPLANT
COVER WAND RF STERILE (DRAPES) IMPLANT
DECANTER SPIKE VIAL GLASS SM (MISCELLANEOUS) ×3 IMPLANT
DRAPE U-SHAPE 76X120 STRL (DRAPES) ×3 IMPLANT
GAUZE PACKING FOLDED 2  STR (GAUZE/BANDAGES/DRESSINGS) ×2
GAUZE PACKING FOLDED 2 STR (GAUZE/BANDAGES/DRESSINGS) ×1 IMPLANT
GLOVE SURG ENC MOIS LTX SZ6.5 (GLOVE) IMPLANT
GLOVE SURG ENC MOIS LTX SZ7 (GLOVE) IMPLANT
GLOVE SURG ENC MOIS LTX SZ8 (GLOVE) ×3 IMPLANT
GLOVE SURG UNDER POLY LF SZ6.5 (GLOVE) IMPLANT
GLOVE SURG UNDER POLY LF SZ7 (GLOVE) IMPLANT
GOWN STRL REUS W/ TWL LRG LVL3 (GOWN DISPOSABLE) ×1 IMPLANT
GOWN STRL REUS W/ TWL XL LVL3 (GOWN DISPOSABLE) ×1 IMPLANT
GOWN STRL REUS W/TWL LRG LVL3 (GOWN DISPOSABLE) ×2
GOWN STRL REUS W/TWL XL LVL3 (GOWN DISPOSABLE) ×2
IV NS 1000ML (IV SOLUTION) ×2
IV NS 1000ML BAXH (IV SOLUTION) ×1 IMPLANT
KIT BASIN OR (CUSTOM PROCEDURE TRAY) ×3 IMPLANT
KIT TURNOVER KIT B (KITS) ×3 IMPLANT
NDL HYPO 25GX1X1/2 BEV (NEEDLE) ×2 IMPLANT
NEEDLE HYPO 25GX1X1/2 BEV (NEEDLE) ×6 IMPLANT
NS IRRIG 1000ML POUR BTL (IV SOLUTION) ×3 IMPLANT
PAD ARMBOARD 7.5X6 YLW CONV (MISCELLANEOUS) ×3 IMPLANT
SLEEVE IRRIGATION ELITE 7 (MISCELLANEOUS) ×3 IMPLANT
SPONGE SURGIFOAM ABS GEL 12-7 (HEMOSTASIS) IMPLANT
SUT CHROMIC 3 0 PS 2 (SUTURE) ×3 IMPLANT
SYR BULB IRRIG 60ML STRL (SYRINGE) ×3 IMPLANT
SYR CONTROL 10ML LL (SYRINGE) ×3 IMPLANT
TRAY ENT MC OR (CUSTOM PROCEDURE TRAY) ×3 IMPLANT
TUBING IRRIGATION (MISCELLANEOUS) ×3 IMPLANT
YANKAUER SUCT BULB TIP NO VENT (SUCTIONS) ×3 IMPLANT

## 2020-11-25 NOTE — Transfer of Care (Signed)
Immediate Anesthesia Transfer of Care Note  Patient: John Mcintyre  Procedure(s) Performed: DENTAL RESTORATION/EXTRACTIONS  Patient Location: PACU  Anesthesia Type:General  Level of Consciousness: drowsy and responds to stimulation  Airway & Oxygen Therapy: Patient Spontanous Breathing and Patient connected to face mask oxygen  Post-op Assessment: Report given to RN and Post -op Vital signs reviewed and stable  Post vital signs: Reviewed and stable  Last Vitals:  Vitals Value Taken Time  BP 139/100 11/25/20 1550  Temp    Pulse 82 11/25/20 1551  Resp 14 11/25/20 1551  SpO2 92 % 11/25/20 1551  Vitals shown include unvalidated device data.  Last Pain:  Vitals:   11/25/20 1219  TempSrc:   PainSc: 0-No pain      Patients Stated Pain Goal: 0 (11/25/20 1219)  Complications: No notable events documented.

## 2020-11-25 NOTE — H&P (Signed)
Anesthesia H&P Update: History and Physical Exam reviewed; patient is OK for planned anesthetic and procedure. ? ?

## 2020-11-25 NOTE — H&P (Signed)
H&P documentation  -History and Physical Reviewed  -Patient has been re-examined  -No change in the plan of care  John Mcintyre  

## 2020-11-25 NOTE — Progress Notes (Signed)
Patient is in phase 2, awaiting ride arrival. Daughter will be here at 8pm.

## 2020-11-25 NOTE — Anesthesia Procedure Notes (Signed)
Procedure Name: Intubation Date/Time: 11/25/2020 2:52 PM Performed by: Rande Brunt, CRNA Pre-anesthesia Checklist: Patient identified, Emergency Drugs available, Suction available and Patient being monitored Patient Re-evaluated:Patient Re-evaluated prior to induction Oxygen Delivery Method: Circle System Utilized Preoxygenation: Pre-oxygenation with 100% oxygen Induction Type: IV induction Ventilation: Mask ventilation without difficulty Laryngoscope Size: Mac and 3 Grade View: Grade III Nasal Tubes: Nasal Rae, Nasal prep performed, Magill forceps- large, utilized and Right Tube size: 7.0 mm Number of attempts: 1 Placement Confirmation: ETT inserted through vocal cords under direct vision, positive ETCO2 and breath sounds checked- equal and bilateral Tube secured with: Tape Dental Injury: Teeth and Oropharynx as per pre-operative assessment  Difficulty Due To: Difficult Airway- due to limited oral opening, Difficult Airway- due to dentition and Difficult Airway- due to large tongue Comments: Afrin sprayed bilateral nostril and nasal trumpet 107F used to dilated R nare.

## 2020-11-25 NOTE — Op Note (Signed)
NAMELUAY, BALDING MEDICAL RECORD NO: [id number] ACCOUNT NO: [id number] DATE OF BIRTH: 07-23-1970 FACILITY: [facility] LOCATION: [facility]PER[facility] PHYSICIAN: Georgia Lopes, DDS  Operative Report   DATE OF PROCEDURE: 11/25/2020  PREOPERATIVE DIAGNOSIS:  Nonrestorable teeth secondary to dental caries and periodontal disease numbers 4, 5, 6, 7, 8, 12, 13, 14, 22, 23, 24, 27.  PROCEDURES:  Extraction of teeth numbers 4, 5, 6, 7, 8, 12, 13, 14, 22, 23, 24, 27.  Alveoplasty, right and left maxilla and mandible.  SURGEON:  Georgia Lopes, DDS  ANESTHESIA:  General, nasal intubation.  DESCRIPTION OF PROCEDURE:  The patient was taken to the operating room and placed on the table in supine position.  General anesthesia was administered intravenously, and nasal endotracheal tube was placed and secured.  The eyes were protected, and the  patient was draped for surgery.  Timeout was performed.  The posterior pharynx was suctioned, and a throat pack was placed. 2% lidocaine 1:100,000 epinephrine was infiltrated in the inferior alveolar block on the right and left sides and buccal and  palatal infiltration around the maxillary teeth.  A bite block was placed on the right side of the mouth.  A sweetheart retractor was used to retract the tongue. A #15 blade was used to make an incision around teeth numbers 22, 23, 24.  The periosteum  was reflected from around the teeth.  Periosteum was tenuous and difficult to maneuver from fibrous scarring of the gums due to significant gum disease.  The teeth were elevated with 301 elevator and removed from the mouth with a dental forceps.  The  sockets were curetted.  The periosteum was reflected to expose the alveolar bone, which was irregular in contour.  Alveoplasty was then performed using egg bur followed by bone file.  Then, the area was irrigated and closed with 3-0 chromic.  Then, the  left maxilla was operated. A 15 blade was used to make an incision around teeth numbers  12, 13, and 14.  The teeth were removed with dental forceps.  No elevation was necessary.  The tissue was trimmed.  The sockets were curetted.  Alveoplasty was  performed, and then the area was closed with 3-0 chromic.  A larger bite block was then placed in the left side of the mouth.  A 15 blade was used to make an incision around tooth number 27.  There was bony expansion in the anterior region around tooth  number 27 due to the socket contour. The tooth was elevated after incising circumferentially in the gingival sulcus and then removed with dental forceps. The periosteum was reflected to expose the bone, which was irregular in contour.  Alveoplasty was  then performed using the egg bur, and the bone file was used to further smooth the area.  Then, this area was irrigated and closed with 3-0 chromic.  The right maxilla was operated next. A 15 blade was used to make an incision around teeth numbers 4, 5,  6, 7, and 8.  The teeth were removed with dental forceps.  Tooth number 5 fractured upon attempted removal.  The buccal root was removed using the Stryker handpiece with a fissure bur to remove circumferential bone around the root tip.  Then, the root  tip was removed with a root tip pick.  Then, the sockets were curetted.  The tissue was trimmed.  The periosteum was exposed and retracted to expose the alveolar crest.  There was contour irregularity with undercuts, so an alveoplasty was  performed using  an egg bur followed by a bone file.  Then, the area was irrigated and closed with 3-0 chromic.  The oral cavity was then inspected and found to have good contour, hemostasis, and closure.  The oral cavity was irrigated and suctioned.  The throat pack  was removed.  The patient was left under the care of anesthesia for extubation and transport to recovery room with plans for discharge home through day surgery.  ESTIMATED BLOOD LOSS:  Minimum.  COMPLICATIONS:  None.  SPECIMENS:  None.   ROH D:  11/25/2020 3:45:01 pm T: 11/25/2020 6:38:00 pm  JOB: [id number]/ [id number]

## 2020-11-25 NOTE — Op Note (Signed)
11/25/2020  3:40 PM  PATIENT:  John Mcintyre  50 y.o. male  PRE-OPERATIVE DIAGNOSIS:  NON RESTORABLE TEETH # 4, 5, 6, 7, 8, 12, 13, 14, 22, 23, 24, 27  POST-OPERATIVE DIAGNOSIS:  SAME  PROCEDURE:  Procedure(s): EXTRACTION TEETH # 4, 5, 6, 7, 8, 12, 13, 14, 22, 23, 24, 27, ALVEOLOPLASTY RIGHT AND LEFT MAXILLA AND MANDIBLE   SURGEON:  Surgeon(s): [name], DMD  ANESTHESIA:   local and general  EBL:  minimal  DRAINS: none   SPECIMEN:  No Specimen  COUNTS:  YES  PLAN OF CARE: Discharge to home after PACU  PATIENT DISPOSITION:  PACU - hemodynamically stable.   PROCEDURE DETAILS: Dictation #[id number]  Georgia Lopes, DMD 11/25/2020 3:40 PM

## 2020-11-26 ENCOUNTER — Encounter (HOSPITAL_COMMUNITY): Payer: Self-pay | Admitting: Oral Surgery

## 2020-11-29 ENCOUNTER — Other Ambulatory Visit: Payer: Self-pay | Admitting: Internal Medicine

## 2020-12-04 NOTE — Anesthesia Postprocedure Evaluation (Signed)
Anesthesia Post Note  Patient: John Mcintyre  Procedure(s) Performed: DENTAL RESTORATION/EXTRACTIONS (Mouth)     Patient location during evaluation: PACU Anesthesia Type: General Level of consciousness: awake and alert Pain management: pain level controlled Vital Signs Assessment: post-procedure vital signs reviewed and stable Respiratory status: spontaneous breathing, nonlabored ventilation, respiratory function stable and patient connected to nasal cannula oxygen Cardiovascular status: blood pressure returned to baseline and stable Postop Assessment: no apparent nausea or vomiting Anesthetic complications: no   No notable events documented.  Last Vitals:  Vitals:   11/25/20 1900 11/25/20 1915  BP: (!) 154/96 (!) 161/105  Pulse: 85 83  Resp:    Temp:    SpO2: 98% 99%    Last Pain:  Vitals:   11/25/20 1845  TempSrc:   PainSc: Asleep                 Madalynn Pickelsimer

## 2021-03-05 ENCOUNTER — Other Ambulatory Visit: Payer: Self-pay | Admitting: Internal Medicine

## 2021-03-18 ENCOUNTER — Telehealth: Payer: Self-pay | Admitting: Internal Medicine

## 2021-03-18 NOTE — Telephone Encounter (Signed)
Patient called the clinic to report chest pain on the left side and back. It has been going on for 4 days. He also reported his right leg is swollen from the knee down to the ankle. I recommended for the patient to go to the emergency room. Patient verbalized he understood the need to go to the ER.

## 2021-03-18 NOTE — Telephone Encounter (Signed)
Pt c/o of Chest Pain: STAT if CP now or developed within 24 hours  1. Are you having CP right now? Yes   2. Are you experiencing any other symptoms (ex. SOB, nausea, vomiting, sweating)? pain in left side of chest, uncomfortable, dizzy right foot and under knee is swollen.  3. How long have you been experiencing CP? 4 days  4. Is your CP continuous or coming and going? continuous  5. Have you taken Nitroglycerin? no ?

## 2021-04-06 ENCOUNTER — Other Ambulatory Visit: Payer: Self-pay | Admitting: Internal Medicine

## 2021-04-11 IMAGING — DX PORTABLE CHEST - 1 VIEW
1 series · 1 of 1 positions shown · non-contrast
Comparison: 09/14/2018

CLINICAL DATA: Pain in left arm.

EXAM:
PORTABLE CHEST 1 VIEW

[chest]
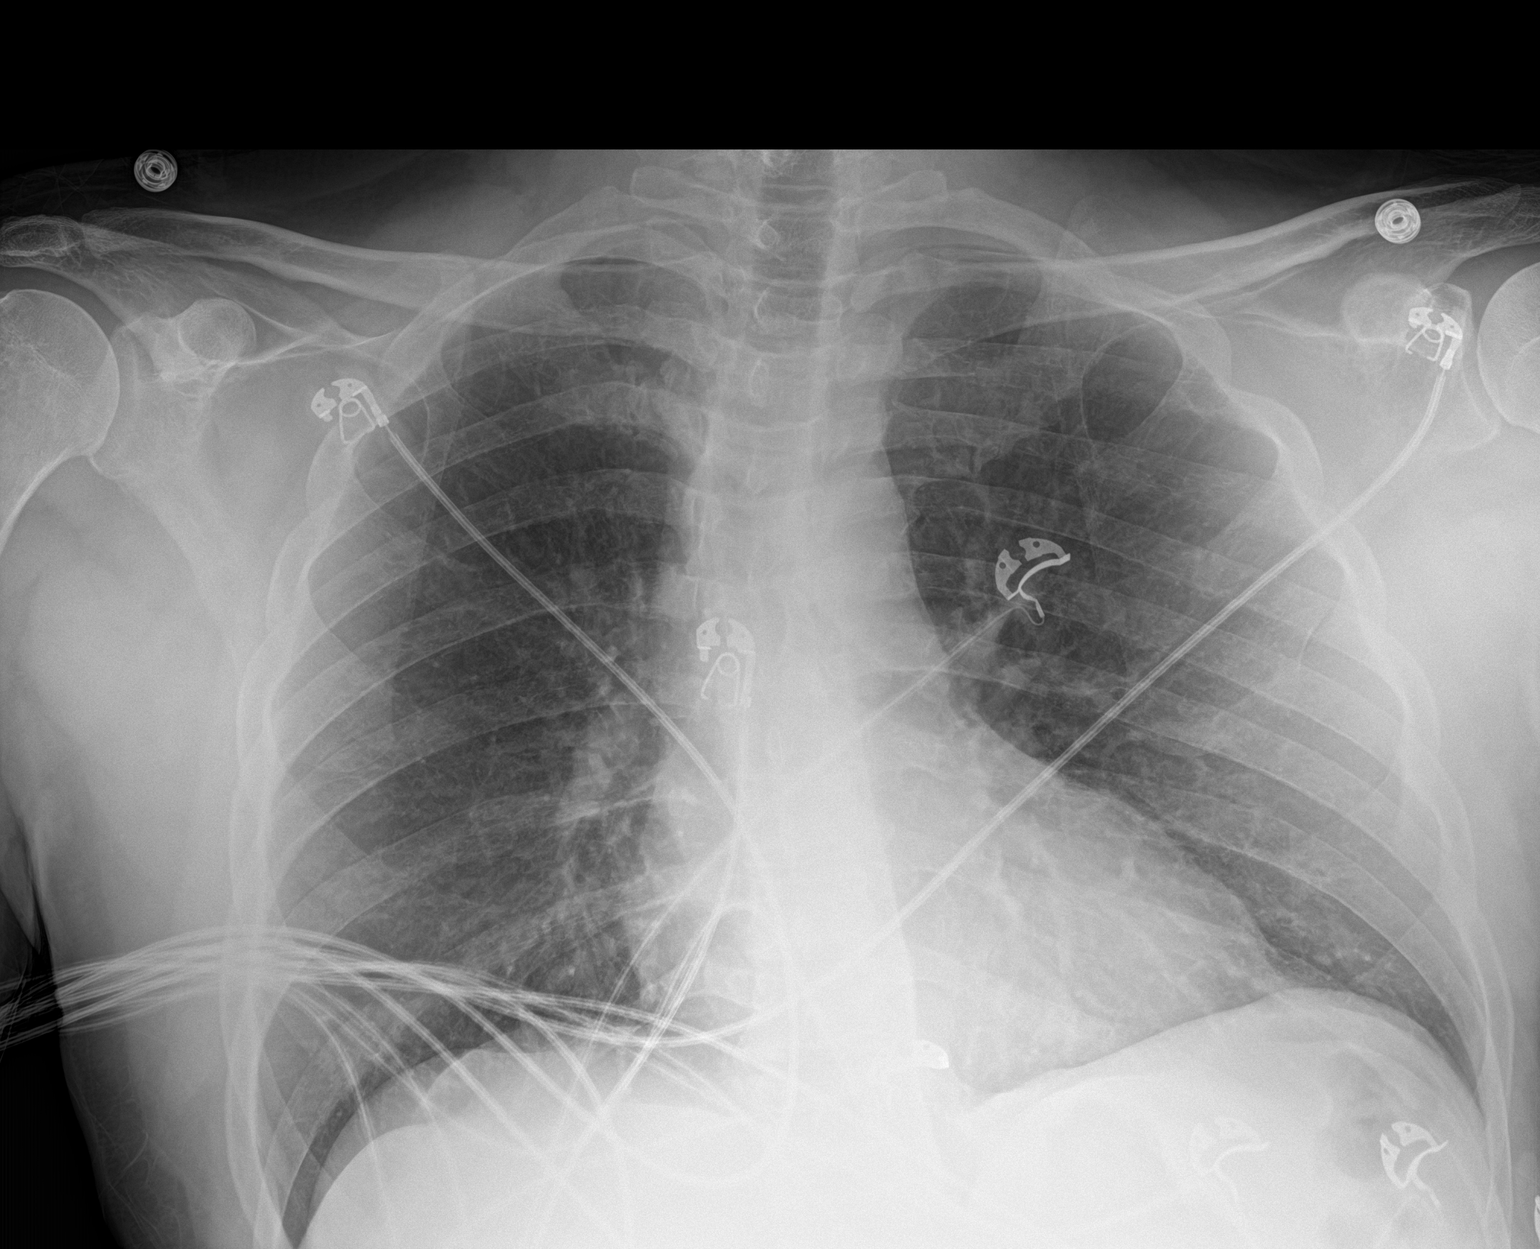

[1 of 1 positions shown; findings below may reference images not displayed]

FINDINGS: Heart and mediastinal contours are within normal limits. No focal
opacities or effusions. No acute bony abnormality.
IMPRESSION: No active disease.

## 2021-05-06 ENCOUNTER — Other Ambulatory Visit: Payer: Self-pay | Admitting: Internal Medicine

## 2021-05-06 ENCOUNTER — Other Ambulatory Visit: Payer: Self-pay | Admitting: Medical

## 2021-05-06 DIAGNOSIS — I10 Essential (primary) hypertension: Secondary | ICD-10-CM

## 2021-06-17 ENCOUNTER — Other Ambulatory Visit: Payer: Self-pay | Admitting: Internal Medicine

## 2021-06-26 ENCOUNTER — Encounter (HOSPITAL_COMMUNITY): Payer: Self-pay

## 2021-06-26 ENCOUNTER — Emergency Department (HOSPITAL_COMMUNITY): Payer: Medicaid Other

## 2021-06-26 ENCOUNTER — Emergency Department (HOSPITAL_COMMUNITY)
Admission: EM | Admit: 2021-06-26 | Discharge: 2021-06-26 | Disposition: A | Discharge status: Home / Self Care | Payer: Medicaid Other | Attending: Emergency Medicine | Admitting: Emergency Medicine

## 2021-06-26 DIAGNOSIS — R0789 Other chest pain: Secondary | ICD-10-CM | POA: Insufficient documentation

## 2021-06-26 DIAGNOSIS — I2510 Atherosclerotic heart disease of native coronary artery without angina pectoris: Secondary | ICD-10-CM | POA: Diagnosis not present

## 2021-06-26 DIAGNOSIS — F17200 Nicotine dependence, unspecified, uncomplicated: Secondary | ICD-10-CM | POA: Diagnosis not present

## 2021-06-26 DIAGNOSIS — Z79899 Other long term (current) drug therapy: Secondary | ICD-10-CM | POA: Diagnosis not present

## 2021-06-26 DIAGNOSIS — I10 Essential (primary) hypertension: Secondary | ICD-10-CM | POA: Insufficient documentation

## 2021-06-26 DIAGNOSIS — Z7982 Long term (current) use of aspirin: Secondary | ICD-10-CM | POA: Insufficient documentation

## 2021-06-26 LAB — BASIC METABOLIC PANEL
Anion gap: 11 (ref 5–15)
BUN: 9 mg/dL (ref 6–20)
CO2: 21 mmol/L — ABNORMAL LOW (ref 22–32)
Calcium: 9.2 mg/dL (ref 8.9–10.3)
Chloride: 105 mmol/L (ref 98–111)
Creatinine, Ser: 0.7 mg/dL (ref 0.61–1.24)
GFR, Estimated: >60 mL/min (ref >60)
Glucose, Bld: 110 mg/dL — ABNORMAL HIGH (ref 70–99)
Potassium: 3.1 mmol/L — ABNORMAL LOW (ref 3.5–5.1)
Sodium: 137 mmol/L (ref 135–145)

## 2021-06-26 LAB — TROPONIN I (HIGH SENSITIVITY)
Troponin I (High Sensitivity): 2 ng/L (ref <18)
Troponin I (High Sensitivity): 3 ng/L (ref <18)

## 2021-06-26 LAB — CBC
HCT: 42.7 % (ref 39.0–52.0)
Hemoglobin: 14.6 g/dL (ref 13.0–17.0)
MCH: 30.7 pg (ref 26.0–34.0)
MCHC: 34.2 g/dL (ref 30.0–36.0)
MCV: 89.9 fL (ref 80.0–100.0)
Platelets: 227 10*3/uL (ref 150–400)
RBC: 4.75 MIL/uL (ref 4.22–5.81)
RDW: 13.5 % (ref 11.5–15.5)
WBC: 12.3 10*3/uL — ABNORMAL HIGH (ref 4.0–10.5)
nRBC: 0 % (ref 0.0–0.2)

## 2021-06-26 MED ORDER — POTASSIUM CHLORIDE CRYS ER 20 MEQ PO TBCR
40.0000 meq | EXTENDED_RELEASE_TABLET | Freq: Once | ORAL | Status: AC
  Administered 2021-06-26: 40 meq via ORAL
  Filled 2021-06-26: qty 2

## 2021-06-26 MED ORDER — LIDOCAINE 5 % EX PTCH
1.0000 | MEDICATED_PATCH | CUTANEOUS | Status: DC
  Administered 2021-06-26: 1 via TRANSDERMAL
  Filled 2021-06-26: qty 1

## 2021-06-26 MED ORDER — KETOROLAC TROMETHAMINE 15 MG/ML IJ SOLN
15.0000 mg | Freq: Once | INTRAMUSCULAR | Status: AC
  Administered 2021-06-26: 15 mg via INTRAMUSCULAR
  Filled 2021-06-26: qty 1

## 2021-06-26 NOTE — Discharge Instructions (Addendum)
Return to ED with any new or worsening symptoms such as shortness of breath, lower extremity swelling, chest pain Please take 800 mg ibuprofen or 1000 mg of Tylenol every 6 hours for relief of pain and pain Please follow-up with your cardiologist as we have discussed.  Please call your cardiologist and request an earlier appointment if it is available Please call the [facility] heart care cardiovascular division, we have attached their contact information to this packet, and request a appointment for 1 month for recheck

## 2021-06-26 NOTE — ED Triage Notes (Signed)
Patient arrives from home with c/o  left sided chest pain that radiates to the left shoulder and back x 7 days. Pt also endorses dizziness.

## 2021-06-26 NOTE — ED Provider Notes (Signed)
[facility]-EMERGENCY DEPT Provider Note   CSN: [id number] Arrival date & time: 06/26/21  0346     History  Chief Complaint  Patient presents with   Chest Pain    John Mcintyre is a 51 y.o. male.  51 year old male with a history of polio, hypertension, hyperlipidemia, CAD s/p DES to proximal LAD in 2017 presents to the emergency department for evaluation of chest pain.  While he reports intermittent chest pain over the past week.  He states that his pain that began at 11 AM yesterday has been different in nature.  Pain began after some heavier lifting.  He reports that it is primarily in his left chest and posterior left shoulder.  It is nonexertional, but is aggravated by movement of his left arm.  He tried a sublingual nitroglycerin for symptoms without relief.  Also took 800 mg ibuprofen without change to symptoms.  No diaphoresis, clamminess, vomiting, syncope or near syncope, new/worsening lower extremity numbness or paresthesias, fever, cough, leg swelling, hemoptysis.  States that his pain today feels different than that associated with his prior episode of ACS.  He has not been able to follow-up with cardiology since 2022.  Did call them to schedule an appt after onset of chest pain, but was unable to get an appt until March.  The history is provided by the patient. No language interpreter was used.  Chest Pain     Home Medications Prior to Admission medications   Medication Sig Start Date End Date Taking? Authorizing Provider  amLODipine (NORVASC) 5 MG tablet TAKE 1 TABLET (5 MG TOTAL) BY MOUTH DAILY. 05/07/21 08/05/21  Hilty, Lisette AbuKenneth C, MD  amoxicillin (AMOXIL) 500 MG capsule Take 1 capsule (500 mg total) by mouth 3 (three) times daily. 11/25/20   Ocie DoyneJensen, Scott, DMD  aspirin EC 81 MG tablet Take 81 mg by mouth daily.    [provider]  ibuprofen (ADVIL) 800 MG tablet Take 400 mg by mouth every 6 (six) hours as needed for moderate pain.    [provider]  isosorbide mononitrate (IMDUR) 60 MG 24 hr tablet TAKE 1 TABLET BY MOUTH EVERY DAY Patient taking differently: Take 60 mg by mouth daily. 11/11/20   Hilty, Lisette AbuKenneth C, MD  lisinopril (ZESTRIL) 20 MG tablet TAKE 1 TABLET BY MOUTH EVERY DAY 04/07/21   Hilty, Lisette AbuKenneth C, MD  lovastatin (MEVACOR) 40 MG tablet Take 1 tablet (40 mg total) by mouth at bedtime. PATIENT MUST SCHEDULE APPOINTMENT FOR FUTURE REFILLS. 06/17/21   Hilty, Lisette AbuKenneth C, MD  metoprolol tartrate (LOPRESSOR) 50 MG tablet TAKE 1 TABLET BY MOUTH EVERY MORNING AND 1/2 TABLET EVERY EVENING 05/07/21   Hilty, Lisette AbuKenneth C, MD  nitroGLYCERIN (NITROSTAT) 0.4 MG SL tablet DISSOLVE 1 TABLET UNDER TONGUE EVERY 5 MINUTES AS NEEDED FOR CHEST PAIN *MAX 3 TABLETS* Patient taking differently: Place 0.4 mg under the tongue every 5 (five) minutes as needed for chest pain. DISSOLVE 1 TABLET UNDER TONGUE EVERY 5 MINUTES AS NEEDED FOR CHEST PAIN *MAX 3 TABLETS* 07/22/20   Kroeger, Ovidio KinKrista M., PA-C  oxyCODONE-acetaminophen (PERCOCET) 5-325 MG tablet Take 1 tablet by mouth every 4 (four) hours as needed. 11/25/20   Ocie DoyneJensen, Scott, DMD      Allergies    Poractant alfa, Pork-derived products, and Tape    Review of Systems   Review of Systems  Cardiovascular:  Positive for chest pain.  Ten systems reviewed and are negative for acute change, except as noted in the HPI.  Physical Exam Updated Vital Signs BP (!) 156/109    Pulse 85    Temp 98 F (36.7 C) (Oral)    Resp (!) 22    Ht 5\' 5"  (1.651 m)    Wt 77.1 kg    SpO2 100%    BMI 28.29 kg/m  Physical Exam Vitals and nursing note reviewed.  Constitutional:      General: He is not in acute distress.    Appearance: He is well-developed. He is not diaphoretic.     Comments: Nontoxic-appearing and in no distress.  Pleasant.  HENT:     Head: Normocephalic and atraumatic.  Eyes:     General: No scleral icterus.    Conjunctiva/sclera: Conjunctivae normal.  Cardiovascular:     Rate and Rhythm:  Normal rate and regular rhythm.     Pulses: Normal pulses.  Pulmonary:     Effort: Pulmonary effort is normal. No respiratory distress.     Breath sounds: No stridor. No wheezing, rhonchi or rales.     Comments: Lungs clear to auscultation bilaterally Abdominal:     General: There is no distension.  Musculoskeletal:        General: Normal range of motion.     Cervical back: Normal range of motion.  Skin:    General: Skin is warm and dry.     Coloration: Skin is not pale.     Findings: No erythema or rash.  Neurological:     Mental Status: He is alert and oriented to person, place, and time.     Coordination: Coordination normal.  Psychiatric:        Behavior: Behavior normal.    ED Results / Procedures / Treatments   Labs (all labs ordered are listed, but only abnormal results are displayed) Labs Reviewed  BASIC METABOLIC PANEL - Abnormal; Notable for the following components:      Result Value   Potassium 3.1 (*)    CO2 21 (*)    Glucose, Bld 110 (*)    All other components within normal limits  CBC - Abnormal; Notable for the following components:   WBC 12.3 (*)    All other components within normal limits  TROPONIN I (HIGH SENSITIVITY)  TROPONIN I (HIGH SENSITIVITY)    EKG EKG Interpretation  Date/Time:  Thursday June 26 2021 04:25:07 EST Ventricular Rate:  88 PR Interval:  162 QRS Duration: 95 QT Interval:  377 QTC Calculation: 457 R Axis:   63 Text Interpretation: Sinus rhythm Probable anteroseptal infarct, old Baseline wander in lead(s) V1 Otherwise no significant change Confirmed by [id number] [phone]) on 06/26/2021 5:51:23 AM  Radiology DG Chest 2 View  Result Date: 06/26/2021 CLINICAL DATA:  Chest pain and weakness EXAM: CHEST - 2 VIEW COMPARISON:  12/24/2018 FINDINGS: Artifact from patient button over the right chest. Diffuse interstitial prominence which is stable. There is no edema, consolidation, effusion, or pneumothorax. Mild thoracic  dextrocurvature. IMPRESSION: Stable exam.  No acute finding. Electronically Signed   By: 12/26/2018 M.D.   On: 06/26/2021 05:27    Procedures Procedures    Medications Ordered in ED Medications  lidocaine (LIDODERM) 5 % 1 patch (1 patch Transdermal Patch Applied 06/26/21 0623)  potassium chloride SA (KLOR-CON M) CR tablet 40 mEq (40 mEq Oral Given 06/26/21 0548)  ketorolac (TORADOL) 15 MG/ML injection 15 mg (15 mg Intramuscular Given 06/26/21 06/28/21)    ED Course/ Medical Decision Making/ A&P  Medical Decision Making Amount and/or Complexity of Data Reviewed Labs: ordered. Radiology: ordered.  Risk Prescription drug management.   This patient presents to the ED for concern of L sided chest pain, this involves an extensive number of treatment options, and is a complaint that carries with it a high risk of complications and morbidity.  The differential diagnosis includes ACS vs PTX vs PNA vs pleural effusion vs Boerhaave's vs PE vs nerve impingement vs muscle strain vs atypical reflux/gastritis.   Co morbidities that complicate the patient evaluation  HTN HLD CAD Tobacco use   Additional history obtained:  Additional history obtained from prior cardiology visits External records from outside source obtained and reviewed including prior Myoview perfusion w/Stress test from 2021 which was stable compared to prior in 2019.   Lab Tests:  I Ordered, and personally interpreted labs.  The pertinent results include:  stable, chronic leukocytosis, mild hypokalemia, negative troponin   Imaging Studies ordered:  I ordered imaging studies including CXR  I independently visualized and interpreted imaging which showed no acute cardiopulmonary abnormality I agree with the radiologist interpretation   Cardiac Monitoring:  The patient was maintained on a cardiac monitor.  I personally viewed and interpreted the cardiac monitored which showed an underlying  rhythm of: NSR   Medicines ordered and prescription drug management:  I ordered medication including Toradol and lidoderm patch for chest pain/L shoulder pain. PO potassium given for mild hypokalemia.  I have reviewed the patients home medicines and have made adjustments as needed   Test Considered:  CT chest   Reevaluation:  After the interventions noted above, I reevaluated the patient and found that they have : remained stable   Social Determinants of Health:  Lack of PCP, though remains established with cardiologist Polio hx with potential to impact mobility   Dispostion:  Pending at this time. Necessary to follow up on delta troponin. Care signed out to John [place], PA-C at shift change.         Final Clinical Impression(s) / ED Diagnoses Final diagnoses:  Atypical chest pain    Rx / DC Orders ED Discharge Orders     None         Antony Madura, PA-C 06/26/21 [id number]    Nira Conn, MD 06/26/21 [phone]

## 2021-06-26 NOTE — ED Provider Notes (Signed)
Physical Exam  BP 126/82    Pulse 93    Temp 98 F (36.7 C) (Oral)    Resp 19    Ht 5\' 5"  (1.651 m)    Wt 77.1 kg    SpO2 99%    BMI 28.29 kg/m   Physical Exam Vitals and nursing note reviewed.  Constitutional:      General: He is not in acute distress.    Appearance: He is not toxic-appearing.  HENT:     Head: Normocephalic and atraumatic.  Eyes:     Extraocular Movements: Extraocular movements intact.     Pupils: Pupils are equal, round, and reactive to light.  Neck:     Thyroid: No thyromegaly.     Vascular: No JVD.  Cardiovascular:     Rate and Rhythm: Normal rate and regular rhythm.  Pulmonary:     Effort: Pulmonary effort is normal. No respiratory distress.     Breath sounds: Normal breath sounds. No wheezing.  Abdominal:     General: Bowel sounds are normal.     Palpations: Abdomen is soft. There is no mass.     Tenderness: There is no abdominal tenderness.  Musculoskeletal:     Cervical back: Normal range of motion.     Right lower leg: No edema.     Left lower leg: No edema.  Skin:    General: Skin is warm and dry.     Capillary Refill: Capillary refill takes less than 2 seconds.     Coloration: Skin is not jaundiced or pale.  Neurological:     Mental Status: He is alert and oriented to person, place, and time.  Psychiatric:        Behavior: Behavior normal.    Procedures  Procedures  ED Course / MDM    Medical Decision Making Amount and/or Complexity of Data Reviewed Labs: ordered. Radiology: ordered.  Risk Prescription drug management.   Patient signed out to me from [facility] at shift change.  In short, this is a 51 year old male with history of polio, hypertension, hyperlipidemia, coronary artery disease presenting to the ED for evaluation of chest pain that is left-sided in nature.  Patient states that he has had intermittent chest pain over the past week.  Patient states pain began at 11 AM and is different in nature.  Began after heavy  lifting.  Patient states that pain is located in his left chest and posterior left shoulder.  States it is nonexertional, aggravated by movement of his left arm.  Attempted to control symptoms utilizing sublingual nitroglycerin without relief.  Patient states the pain today feels different than associated episode of ACS.  Patient work-up here is been largely reassuring.  Delta troponin from 2-3.  BMP shows hypokalemia 3.1, treated.  Patient CBC shows elevated white blood cell count of 12.3, this appears chronic in nature based on review of chart.  Chest x-ray shows no acute findings.  At this time, per [facility]' plan, this patient seems stable for discharge.  Patient has been advised needs to follow-up with cardiologist as soon as possible.  He states he has an appointment set for March.  I have given this patient return precautions and he voices understanding.  I have advised him were his chest pain to return needs to report back immediately to the ED for further evaluation.  The patient had all his questions answered to his satisfaction.  Patient stable at discharge.       April,  PA-C 06/26/21 [id number]    Linwood Dibbles, MD 06/26/21 2009

## 2021-06-26 NOTE — ED Notes (Signed)
Save blue tube in main lab °

## 2021-07-02 NOTE — Progress Notes (Deleted)
Cardiology Office Note:    Date:  07/02/2021   ID:  John Mcintyre, DOB May 15, 1971, MRN [id number]  PCP:  Oneita Hurt, No   [facility] Providers Cardiologist:  Chrystie Nose, MD { Click to update primary MD,subspecialty MD or APP then REFRESH:1}    Referring MD: Piedad Climes, [place], *   No chief complaint on file. ***  History of Present Illness:    John Mcintyre is a 51 y.o. male with a hx of CAD s/p DES to LAD 2017, HTN, hyperlipidemia, tobacco abuse, polio,   He presented to ED /17 for evaluation of chest pain. Prior myoview in 2016 was low risk with small ant/apical defect. He was treated medically and did well until 7/17 when he called our office to schedule an ov for exertional chest tightness, bilateral arm pain and dyspnea. LHC revealed CTO of the RCA with L-R collaterals from the CFX, a 40% CFX, and a 95% proximal LAD treated with DES, EF was 45-50%  Stress test 09/2019  Recent ED visit for chest pain nonexertional, aggravated by movement of left arm that feels different from pain with ACS   Past Medical History:  Diagnosis Date   Coronary artery disease    Hypercholesteremia    Hypertension    Myocardial infarction [facility])    Polio     Past Surgical History:  Procedure Laterality Date   CARDIAC CATHETERIZATION N/A 12/23/2015   Procedure: Left Heart Cath and Coronary Angiography;  Surgeon: Kathleene Hazel, MD;  Location: [facility] INVASIVE CV LAB;  Service: Cardiovascular;  Laterality: N/A;   CARDIAC CATHETERIZATION N/A 12/23/2015   Procedure: Coronary Stent Intervention;  Surgeon: Kathleene Hazel, MD;  Location: [facility] INVASIVE CV LAB;  Service: Cardiovascular;  Laterality: N/A;   CORONARY STENT PLACEMENT  12/23/2015   Successful PTCA/DES x 1 proximal LAD   HIP SURGERY Left 1987   for polio, not sure what was done   KNEE SURGERY Left 1987   for polio   TOOTH EXTRACTION N/A 11/25/2020   Procedure: DENTAL RESTORATION/EXTRACTIONS;  Surgeon: [name], DMD;   Location: [facility] OR;  Service: Oral Surgery;  Laterality: N/A;    Current Medications: No outpatient medications have been marked as taking for the 07/03/21 encounter (Appointment) with Lissa Hoard, Zachary George, NP.     Allergies:   Poractant alfa, Pork-derived products, and Tape   Social History   Socioeconomic History   Marital status: Married    Spouse name: Not on file   Number of children: 3   Years of education: 2 years of college   Highest education level: Not on file  Occupational History   Occupation: Media planner  Tobacco Use   Smoking status: Every Day    Packs/day: 2.00    Types: Cigarettes   Smokeless tobacco: Never   Tobacco comments:    ultra lights    Hemp cigarettes 2 days ago  Vaping Use   Vaping Use: Never used  Substance and Sexual Activity   Alcohol use: Yes    Alcohol/week: 6.0 standard drinks    Types: 6 Cans of beer per week    Comment: occasionally   Drug use: No   Sexual activity: Not on file  Other Topics Concern   Not on file  Social History Narrative   Lives at home with wife and three children.   Right-handed.   5 cups tea per day.   Social Determinants of Health   Financial Resource Strain: Not on file  Food Insecurity: Not  on file  Transportation Needs: Not on file  Physical Activity: Not on file  Stress: Not on file  Social Connections: Not on file     Family History: The patient's ***family history includes Heart failure in his father; Hypertension in his mother.  ROS:   Please see the history of present illness.    *** All other systems reviewed and are negative.  Labs/Other Studies Reviewed:    The following studies were reviewed today:  Lexiscan myoview 5/21  The left ventricular ejection fraction is mildly decreased (45-54%). Nuclear stress EF: 47%. There was no ST segment deviation noted during stress. No T wave inversion was noted during stress. Findings consistent with prior myocardial infarction. This is an  intermediate risk study.   Impression:   1. There is a fixed, medium size (5-8% of LV), moderate to severe perfusion defect present in the mid anterior and apical anterior segments consistent with prior infarction. This is consistent the patient's history of prior LAD stent.  2. There is no ischemia present.  3. LVEF is mildly reduced, 47%.  4. This is an intermediate risk study.  5. There is no change from the prior study.   Lexiscan myoview 3/19  Nuclear stress EF: 42%. Distal apical anteroseptal akinesis. The left ventricular ejection fraction is moderately decreased (30-44%). There was no ST segment deviation noted during stress. Defect 1: There is a medium defect of moderate severity present in the mid anteroseptal and apical anterior location. Findings consistent with prior myocardial infarction with peri-infarct ischemia. Mild increase in size of area involved when compared to prior study. Prior LAD stent. This is an intermediate risk study.   I do not appreciate any significant ischemia on the myoview - mostly a fixed defect. I would recommend treating this medically (such as adding long-acting nitrate) - if he has persistent or worsening symptoms, then we could consider repeat cath.      Recent Labs: 06/26/2021: BUN 9; Creatinine, Ser 0.70; Hemoglobin 14.6; Platelets 227; Potassium 3.1; Sodium 137  Recent Lipid Panel    Component Value Date/Time   CHOL 218 (H) 09/20/2019 1618   TRIG 473 (H) 09/20/2019 1618   HDL 32 (L) 09/20/2019 1618   CHOLHDL 6.8 (H) 09/20/2019 1618   CHOLHDL 6.3 (H) 12/18/2015 1016   VLDL 60 (H) 12/18/2015 1016   LDLCALC 105 (H) 09/20/2019 1618     Risk Assessment/Calculations:   {Does this patient have ATRIAL FIBRILLATION?[phone]}       Physical Exam:    VS:  There were no vitals taken for this visit.    Wt Readings from Last 3 Encounters:  06/26/21 170 lb (77.1 kg)  11/25/20 170 lb (77.1 kg)  07/22/20 176 lb (79.8 kg)     GEN: ***  Well nourished, well developed in no acute distress HEENT: Normal NECK: No JVD; No carotid bruits CARDIAC: ***RRR, no murmurs, rubs, gallops RESPIRATORY:  Clear to auscultation without rales, wheezing or rhonchi  ABDOMEN: Soft, non-tender, non-distended MUSCULOSKELETAL:  No edema; No deformity. *** pedal pulses, ***bilaterally SKIN: Warm and dry NEUROLOGIC:  Alert and oriented x 3 PSYCHIATRIC:  Normal affect   EKG:  EKG is *** ordered today.  The ekg ordered today demonstrates ***  Diagnoses:    No diagnosis found. Assessment and Plan:     ***      {Are you ordering a CV Procedure (e.g. stress test, cath, DCCV, TEE, etc)?   Press F2        :[id number]}  Medication Adjustments/Labs and Tests Ordered: Current medicines are reviewed at length with the patient today.  Concerns regarding medicines are outlined above.  No orders of the defined types were placed in this encounter.  No orders of the defined types were placed in this encounter.   There are no Patient Instructions on file for this visit.   Signed, Levi AlandSwinyer, Yovanni Frenette M, NP  07/02/2021 7:15 PM    [facility]

## 2021-07-03 ENCOUNTER — Ambulatory Visit (HOSPITAL_BASED_OUTPATIENT_CLINIC_OR_DEPARTMENT_OTHER): Payer: Medicaid Other | Admitting: Nurse Practitioner

## 2021-07-08 ENCOUNTER — Other Ambulatory Visit: Payer: Self-pay | Admitting: Internal Medicine

## 2021-07-17 ENCOUNTER — Ambulatory Visit: Payer: Medicaid Other | Admitting: Internal Medicine

## 2021-07-25 ENCOUNTER — Encounter: Payer: Self-pay | Admitting: Internal Medicine

## 2021-07-25 ENCOUNTER — Other Ambulatory Visit: Payer: Self-pay

## 2021-07-25 ENCOUNTER — Ambulatory Visit (INDEPENDENT_AMBULATORY_CARE_PROVIDER_SITE_OTHER): Payer: Medicaid Other | Admitting: Internal Medicine

## 2021-07-25 VITALS — BP 129/93 | HR 101 | Ht 65.0 in | Wt 169.4 lb

## 2021-07-25 DIAGNOSIS — Z716 Tobacco abuse counseling: Secondary | ICD-10-CM

## 2021-07-25 DIAGNOSIS — Z1329 Encounter for screening for other suspected endocrine disorder: Secondary | ICD-10-CM | POA: Diagnosis not present

## 2021-07-25 DIAGNOSIS — Z720 Tobacco use: Secondary | ICD-10-CM

## 2021-07-25 DIAGNOSIS — E785 Hyperlipidemia, unspecified: Secondary | ICD-10-CM

## 2021-07-25 DIAGNOSIS — G14 Postpolio syndrome: Secondary | ICD-10-CM | POA: Diagnosis not present

## 2021-07-25 DIAGNOSIS — Z125 Encounter for screening for malignant neoplasm of prostate: Secondary | ICD-10-CM

## 2021-07-25 DIAGNOSIS — I2510 Atherosclerotic heart disease of native coronary artery without angina pectoris: Secondary | ICD-10-CM | POA: Diagnosis not present

## 2021-07-25 LAB — COMPREHENSIVE METABOLIC PANEL
ALT: 30 IU/L (ref 0–44)
AST: 23 IU/L (ref 0–40)
Albumin/Globulin Ratio: 1.8 (ref 1.2–2.2)
Albumin: 4.6 g/dL (ref 4.0–5.0)
Alkaline Phosphatase: 111 IU/L (ref 44–121)
BUN/Creatinine Ratio: 8 — ABNORMAL LOW (ref 9–20)
BUN: 6 mg/dL (ref 6–24)
Bilirubin Total: 0.2 mg/dL (ref 0.0–1.2)
CO2: 23 mmol/L (ref 20–29)
Calcium: 9.6 mg/dL (ref 8.7–10.2)
Chloride: 102 mmol/L (ref 96–106)
Creatinine, Ser: 0.71 mg/dL — ABNORMAL LOW (ref 0.76–1.27)
Globulin, Total: 2.6 g/dL (ref 1.5–4.5)
Glucose: 90 mg/dL (ref 70–99)
Potassium: 4.3 mmol/L (ref 3.5–5.2)
Sodium: 139 mmol/L (ref 134–144)
Total Protein: 7.2 g/dL (ref 6.0–8.5)
eGFR: 112 mL/min/{1.73_m2} (ref >59)

## 2021-07-25 LAB — CBC
Hematocrit: 44.5 % (ref 37.5–51.0)
Hemoglobin: 15.2 g/dL (ref 13.0–17.7)
MCH: 30.5 pg (ref 26.6–33.0)
MCHC: 34.2 g/dL (ref 31.5–35.7)
MCV: 89 fL (ref 79–97)
Platelets: 234 10*3/uL (ref 150–450)
RBC: 4.99 x10E6/uL (ref 4.14–5.80)
RDW: 12.8 % (ref 11.6–15.4)
WBC: 11.5 10*3/uL — ABNORMAL HIGH (ref 3.4–10.8)

## 2021-07-25 LAB — LIPID PANEL
Chol/HDL Ratio: 5.7 ratio — ABNORMAL HIGH (ref 0.0–5.0)
Cholesterol, Total: 239 mg/dL — ABNORMAL HIGH (ref 100–199)
HDL: 42 mg/dL (ref >39)
LDL Chol Calc (NIH): 111 mg/dL — ABNORMAL HIGH (ref 0–99)
Triglycerides: 500 mg/dL — ABNORMAL HIGH (ref 0–149)
VLDL Cholesterol Cal: 86 mg/dL — ABNORMAL HIGH (ref 5–40)

## 2021-07-25 LAB — HEMOGLOBIN A1C
Est. average glucose Bld gHb Est-mCnc: 114 mg/dL
Hgb A1c MFr Bld: 5.6 % (ref 4.8–5.6)

## 2021-07-25 LAB — PSA: Prostate Specific Ag, Serum: 0.5 ng/mL (ref 0.0–4.0)

## 2021-07-25 LAB — TSH: TSH: 1.94 u[IU]/mL (ref 0.450–4.500)

## 2021-07-25 MED ORDER — BUPROPION HCL ER (SR) 150 MG PO TB12: 150.0000 mg | ORAL_TABLET | Freq: Two times a day (BID) | ORAL | 2 refills | Status: DC

## 2021-07-25 NOTE — Progress Notes (Signed)
OFFICE NOTE  Chief Complaint:  Follow-up chest pain  Primary Care Physician: Pcp, No  HPI:  John Mcintyre is a pleasant 51 year old male [place] He says for almost all of his life he had polio and this affected his legs significantly causing weakness in the need to use crutches to walk. 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 Recently he's been having worsening pain in his upper chest shoulders and down both arms. Some the pain is in the back of his neck and even radiates to the right half of his face. While some of those symptoms certainly sound neurologic, he also has some associated fatigue and shortness of breath. He is a smoker and has a history of hypertension and dyslipidemia which is not been well controlled. He was recently started on lovastatin. Triglycerides were noted to be elevated greater than 700 and recently came down to the 300s. His father has a history of heart disease and sounds like he died from heart failure in his 23s. I had the pleasure seeing John Mcintyre back in the office today. He underwent a nuclear stress test which was interpreted as low risk. There was a very small distal anteroapical fixed defect which was suggestive of possible scar. EF was low normal at 52%. No reversible ischemia was seen. He reports no further significant symptoms. Subsequently he presented with worsening chest pain and was referred by Norma Fredrickson, NP for cardiac catheterization in 11/2015. 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 There was a CTO of the RCA with left to right collaterals and severe proximal LAD stenosis. He had successful PCI to the proximal LAD and was chest pain free thereafter.   04/20/2016   He was seen in the ER yesterday with recurrent chest pain for the past 2 days which is similar to his prior anginal symptoms. He reports compliance with his medications, but did not take it this morning. BP was 174/113 on  admission. He was seen in the office in 12/2015 and his lisinopril was increased to 10 mg daily. Initial troponin was negative. Labs indicate a mildly elevated WBC count at 11.3. CXR unremarkable. EKG shows NSR without ischemic changes. He ruled out for acute MI and I increased his lisinopril to 20 mg daily. He said he try to get to the pharmacy for that prescription, but was somewhat confused that I wanted him to take twice the dose that he currently takes. This morning he took 10 mg of lisinopril blood pressure was 152/90. He denies any further chest pain. He took Aleve over-the-counter and reports improvement in his neck pain and shoulder pain. He continues to have difficulty ambulating, particularly with worsening weakness of his legs secondary to polio.  10/15/2016  Mr. John Mcintyre returns for follow-up. He was seen in the ER last night and discharged early this morning for chest pain. He recently called our office about this. He describes pain all across his chest and upper back and into his arms. This pain is completely different than the pain he had prior to his cardiac stent placement. 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 Troponin was negative. He was discharged for follow-up with [name]. He feels that his pain may be related to using crutches. 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 He may have neuropathic or musculoskeletal pain related to that. He is continuing on aspirin and Plavix which is more than adequate treatment for his coronary disease. He also complained of visual changes today including difficulty reading. I suspect this is presbyopia however at times he feels that he has some numbness on one half of his face and other unusual symptoms. He does report very poor sleep at night as well. He denies any snoring or apnea but gets less than 6 hours of  sleep. He also tried Chantix for smoking cessation but said that it worsened his pain and he stopped taking it. He also felt that it made it more difficult for him to sleep.  01/22/2017  John Mcintyre returns for follow-up. He reports she's had no further chest pain. He continues to have problems with leg weakness secondary to history of polio. He has had significant muscle wasting particularly in the left hip and more difficulty walking. He is inquiring about bracing. I referred him to neurology however the wait was so long he did not take the appointment. He is more than 1 year out from stenting and I believe could be taken off of Plavix. Blood pressure is at goal today.  06/29/2017  John Mcintyre today in follow-up.  He reports some chest pain.  This is not similar to his pain with his recent stent implantation in July 2017.  He completed a year of Plavix and now is on aspirin.  Some of his symptoms seem to be worse with muscle use and he as required braces in order to support his upper body.  At his last office visit he inquired about additional bracing and I referred him to Dr. Hermelinda Medicus with physical medicine and rehabilitation.  Unfortunately he never made that appointment.  He is also been referred to Dr. Allena Katz with neurology twice but canceled the appointment since they were scheduled 3 months out.  He said he either missed the appointments or did not have enough patients for them.  That being said he is asking for a referral again today.  He also says he is having trouble with his vision and I advised him to contact an ophthalmologist.  01/28/2018  John Mcintyre recently has been experiencing some chest pain.  He presented to the emergency department yesterday and had point tenderness.  He was able to point to it was in the area around the left nipple.  It is distinctly different than the pain he had previously with his prior stent which included pain across his chest and shortness of breath with  exertion.  He has had no more symptoms like that.  He took nitroglycerin without any real improvement.  He was given aspirin in the ER.  EKG was negative for ischemia.  Troponin was negative x2.  Other studies were negative and low risk for pulmonary embolus.  He has worked with rehab/physiatry for bracing, unfortunately was not able to afford any good options.  He reports he is considering edentia lesion which was recommended due to poor dentition and multiple caries.  10/19/2019  Mr. Baria returns today for follow-up of a Myoview stress test.  He was recently seen by Cadence Fransico Michael, PA-C, who wished to evaluate him further for chest pain.  He has a history of coronary artery disease and was found to have significant proximal LAD disease which was stented as well as an occluded distal right coronary with left to right collaterals.  In 2019 he had an abnormal  Myoview stress test which showed distal anterior and apical fixed perfusion defect suggestive of scar.  EF was in the low 40% range.  He is repeat Myoview in April of this year showed a very similar finding with improvement in LVEF however to the upper 40s, however the fixed defect persisted.  There was no evidence of reversible ischemia.  He is nitrate was increased to 60 mg daily.  In addition he had repeat labs which showed his LDL cholesterol was still well above target of less than 70 and therefore his lovastatin was also increased from 20 to 40 mg.  He is not clear if he is actually taking that dose.  His other complaints today included chronic headache for which she takes ibuprofen 800 mg at least twice a day.  I suspect he may have a rebound headache.  In addition he has been having chronic shoulder pain and feels like he could benefit from physical therapy.  He also continues to report issues with erectile dysfunction however cannot take PDE-5 inhibitors in addition to his nitrates.  07/25/2021  Mr. Caravello is seen today in follow-up.  He was  just in the ER [facility] with atypical type chest burning.  Troponins were negative.  He has had some continued fatigue.  He reports he wants to stop smoking.  He still smokes about 2 packs/day.  We discussed possible options and I think the best option might be bupropion.  He was interested in medications for erectile dysfunction but was we have discussed in the past he cannot take PDE 5 inhibitors because he is on long-acting nitrates.  He is inquiring about extended lab work today because he is not currently seeing a primary care provider.  [facility] however he was dismissed from the practice due to no-shows.  Will refer him to community health and wellness to see if that may be another option for him since he has Medicaid.  He is requesting crutches today.  PMHx:  Past Medical History:  Diagnosis Date   Coronary artery disease    Hypercholesteremia    Hypertension    Myocardial infarction [facility](HCC)    Polio     Past Surgical History:  Procedure Laterality Date   CARDIAC CATHETERIZATION N/A 12/23/2015   Procedure: Left Heart Cath and Coronary Angiography;  Surgeon: Kathleene Hazelhristopher D McAlhany, MD;  [facility];  Service: Cardiovascular;  Laterality: N/A;   CARDIAC CATHETERIZATION N/A 12/23/2015   Procedure: Coronary Stent Intervention;  Surgeon: Kathleene Hazelhristopher D McAlhany, MD;  [facility];  Service: Cardiovascular;  Laterality: N/A;   CORONARY STENT PLACEMENT  12/23/2015   Successful PTCA/DES x 1 proximal LAD   HIP SURGERY Left 1987   for polio, not sure what was done   KNEE SURGERY Left 1987   for polio   TOOTH EXTRACTION N/A 11/25/2020   Procedure: DENTAL RESTORATION/EXTRACTIONS;  Surgeon: Ocie DoyneJensen, Scott, DMD;  Location: [facility] OR;  Service: Oral Surgery;  Laterality: N/A;    FAMHx:  Family History  Problem Relation Age of Onset   Hypertension Mother    Heart failure Father     SOCHx:   reports that he has been smoking cigarettes. He has been  smoking an average of 2 packs per day. He has never used smokeless tobacco. He reports current alcohol use of about 6.0 standard drinks per week. He reports that he does not use drugs.  ALLERGIES:  Allergies  Allergen Reactions   Poractant  Alfa Other (See Comments)    Patient does not eat pork because of his culture   Pork-Derived Products Other (See Comments)    Patient does not eat pork because of his culture   Tape Other (See Comments)    Skin is sensitive    ROS: Pertinent items noted in HPI and remainder of comprehensive ROS otherwise negative.  HOME MEDS: Current Outpatient Medications  Medication Sig Dispense Refill   aspirin EC 81 MG tablet Take 81 mg by mouth daily.     ibuprofen (ADVIL) 800 MG tablet Take 400 mg by mouth every 6 (six) hours as needed for moderate pain.     isosorbide mononitrate (IMDUR) 60 MG 24 hr tablet TAKE 1 TABLET BY MOUTH EVERY DAY (Patient taking differently: Take 60 mg by mouth daily.) 90 tablet 3   lisinopril (ZESTRIL) 20 MG tablet TAKE 1 TABLET BY MOUTH EVERY DAY 90 tablet 1   lovastatin (MEVACOR) 40 MG tablet Take 1 tablet (40 mg total) by mouth at bedtime. PATIENT NEEDS TO SCHEDULE AN OFFICE VISIT FOR FUTURE REFILLS. 30 tablet 0   metoprolol tartrate (LOPRESSOR) 50 MG tablet TAKE 1 TABLET BY MOUTH EVERY MORNING AND 1/2 TABLET EVERY EVENING 45 tablet 6   nitroGLYCERIN (NITROSTAT) 0.4 MG SL tablet DISSOLVE 1 TABLET UNDER TONGUE EVERY 5 MINUTES AS NEEDED FOR CHEST PAIN *MAX 3 TABLETS* (Patient taking differently: Place 0.4 mg under the tongue every 5 (five) minutes as needed for chest pain. DISSOLVE 1 TABLET UNDER TONGUE EVERY 5 MINUTES AS NEEDED FOR CHEST PAIN *MAX 3 TABLETS*) 25 tablet 6   oxyCODONE-acetaminophen (PERCOCET) 5-325 MG tablet Take 1 tablet by mouth every 4 (four) hours as needed. 30 tablet 0   amLODipine (NORVASC) 5 MG tablet TAKE 1 TABLET (5 MG TOTAL) BY MOUTH DAILY. (Patient not taking: Reported on 07/25/2021) 90 tablet 0   amoxicillin  (AMOXIL) 500 MG capsule Take 1 capsule (500 mg total) by mouth 3 (three) times daily. (Patient not taking: Reported on 07/25/2021) 21 capsule 0   No current facility-administered medications for this visit.    LABS/IMAGING: No results found for this or any previous visit (from the past 48 hour(s)). No results found.   WEIGHTS: Wt Readings from Last 3 Encounters:  07/25/21 169 lb 6.4 oz (76.8 kg)  06/26/21 170 lb (77.1 kg)  11/25/20 170 lb (77.1 kg)    VITALS: BP (!) 129/93    Pulse (!) 101    Ht 5\' 5"  (1.651 m)    Wt 169 lb 6.4 oz (76.8 kg)    SpO2 99%    BMI 28.19 kg/m   EXAM: General appearance: alert and no distress Neck: no carotid bruit and no JVD Lungs: clear to auscultation bilaterally Heart: regular rate and rhythm Abdomen: soft, non-tender; bowel sounds normal; no masses,  no organomegaly Extremities: Peripheral muscle wasting secondary to polio Pulses: 2+ and symmetric Skin: Skin color, texture, turgor normal. No rashes or lesions Neurologic: Grossly normal Psych: Pleasant  EKG: Sinus tachycardia 101-personally reviewed  ASSESSMENT: Chest pain- atypical, sounds neuropathic in nature- recent PCI to the LAD (11/2015) and noted CTO of the RCA with left to right collaterals Hypertension Dyslipidemia Tobacco abuse Family history of coronary disease Polio (with possible post-polio syndrome) Suspect NSAID rebound headache Erectile dysfunction  PLAN: 1.   Mr. Pistilli was recently in the ER with atypical type chest pain.  Troponins were negative.  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  He  smokes 2 packs a day and is interested in quitting.  We will start him on bupropion 150 mg twice daily for up to 3 months.  Will get extensive lab work today per his request and have also provided prescription for crutches which she needs because his are broken down and he has a history of polio with lower extremity wasting.  He will need  to reestablish with a primary care provider.  We will refer him to community health and wellness.  Follow-up with me annually or sooner as necessary.  Chrystie NoseKenneth C. Ellyanna Holton, MD, [facility] AmarilloFACC, FACP  [place]   [facility]  Medical Director of the Advanced Lipid Disorders &  Cardiovascular Risk Reduction Clinic Diplomate of the American Board of Clinical Lipidology Attending Cardiologist  Direct Dial: [phone]   Fax: [phone]  Website:  [email or web address]  Candee Hoon C [place] Throck[place]on 07/25/2021, 8:32 AM

## 2021-07-25 NOTE — Patient Instructions (Addendum)
Medication Instructions:  Dr. Rennis Golden prescribed wellbutrin 150mg  twice daily -- take for 3 months  *If you need a refill on your cardiac medications before your next appointment, please call your pharmacy*   Lab Work: TSH, CBC, CMET, A1c, PSA, lipid panel today   If you have labs (blood work) drawn today and your tests are completely normal, you will receive your results only by: MyChart Message (if you have MyChart) OR A paper copy in the mail If you have any lab test that is abnormal or we need to change your treatment, we will call you to review the results.  Follow-Up: At [facility], you and your health needs are our priority.  As part of our continuing mission to provide you with exceptional heart care, we have created designated Provider Care Teams.  These Care Teams include your primary Cardiologist (physician) and Advanced Practice Providers (APPs -  Physician Assistants and Nurse Practitioners) who all work together to provide you with the care you need, when you need it.  We recommend signing up for the patient portal called "MyChart".  Sign up information is provided on this After Visit Summary.  MyChart is used to connect with patients for Virtual Visits (Telemedicine).  Patients are able to view lab/test results, encounter notes, upcoming appointments, etc.  Non-urgent messages can be sent to your provider as well.   To learn more about what you can do with MyChart, go to [facility].    Your next appointment:    1 year with [email or web address]   Other Instructions  [facility] [phone]  [phone][address] [place],  [place]  [place]

## 2021-08-03 ENCOUNTER — Other Ambulatory Visit: Payer: Self-pay | Admitting: Internal Medicine

## 2021-08-03 DIAGNOSIS — I10 Essential (primary) hypertension: Secondary | ICD-10-CM

## 2021-08-06 ENCOUNTER — Ambulatory Visit: Payer: Medicaid Other | Admitting: Physician Assistant

## 2021-08-19 ENCOUNTER — Other Ambulatory Visit: Payer: Self-pay | Admitting: *Deleted

## 2021-08-19 ENCOUNTER — Other Ambulatory Visit: Payer: Self-pay | Admitting: Internal Medicine

## 2021-08-19 ENCOUNTER — Encounter: Payer: Self-pay | Admitting: Internal Medicine

## 2021-08-19 DIAGNOSIS — E785 Hyperlipidemia, unspecified: Secondary | ICD-10-CM

## 2021-08-19 MED ORDER — LOVASTATIN 40 MG PO TABS: 40.0000 mg | ORAL_TABLET | Freq: Every day | ORAL | 3 refills | Status: DC

## 2021-08-19 MED ORDER — FENOFIBRATE 160 MG PO TABS: 160.0000 mg | ORAL_TABLET | Freq: Every day | ORAL | 3 refills | Status: DC

## 2021-09-14 ENCOUNTER — Other Ambulatory Visit: Payer: Self-pay | Admitting: Internal Medicine

## 2021-10-29 ENCOUNTER — Other Ambulatory Visit: Payer: Self-pay

## 2021-11-29 ENCOUNTER — Other Ambulatory Visit: Payer: Self-pay | Admitting: Internal Medicine

## 2021-11-29 DIAGNOSIS — I2510 Atherosclerotic heart disease of native coronary artery without angina pectoris: Secondary | ICD-10-CM

## 2021-11-29 DIAGNOSIS — I10 Essential (primary) hypertension: Secondary | ICD-10-CM

## 2022-01-07 ENCOUNTER — Other Ambulatory Visit: Payer: Self-pay | Admitting: Internal Medicine

## 2022-03-26 ENCOUNTER — Other Ambulatory Visit: Payer: Self-pay | Admitting: Internal Medicine

## 2022-03-26 DIAGNOSIS — I10 Essential (primary) hypertension: Secondary | ICD-10-CM

## 2022-05-01 ENCOUNTER — Other Ambulatory Visit: Payer: Self-pay | Admitting: Internal Medicine

## 2022-05-01 DIAGNOSIS — I10 Essential (primary) hypertension: Secondary | ICD-10-CM

## 2022-06-10 ENCOUNTER — Other Ambulatory Visit: Payer: Self-pay | Admitting: Internal Medicine

## 2022-07-15 NOTE — Progress Notes (Deleted)
Cardiology Clinic Note   Patient Name: Eugene Shadowens Date of Encounter: 07/15/2022  Primary Care Provider:  Pcp, No Primary Cardiologist:  Pixie Casino, MD  Patient Profile    Nolen Siracusa 52 year old male presents to the clinic today for follow-up evaluation of his hyperlipidemia, hypertension, and coronary artery disease.  Past Medical History    Past Medical History:  Diagnosis Date   Coronary artery disease    Hypercholesteremia    Hypertension    Myocardial infarction [facility])    Polio    Past Surgical History:  Procedure Laterality Date   CARDIAC CATHETERIZATION N/A 12/23/2015   Procedure: Left Heart Cath and Coronary Angiography;  Surgeon: Burnell Blanks, MD;  Location: [place] [facility];  Service: Cardiovascular;  Laterality: N/A;   CARDIAC CATHETERIZATION N/A 12/23/2015   Procedure: Coronary Stent Intervention;  Surgeon: Burnell Blanks, MD;  Location: [place] [facility];  Service: Cardiovascular;  Laterality: N/A;   CORONARY STENT PLACEMENT  12/23/2015   Successful PTCA/DES x 1 proximal LAD   HIP SURGERY Left 1987   for polio, not sure what was done   KNEE SURGERY Left 1987   for polio   TOOTH EXTRACTION N/A 11/25/2020   Procedure: DENTAL RESTORATION/EXTRACTIONS;  Surgeon: Diona Browner, DMD;  Location: [place];  Service: Oral Surgery;  Laterality: N/A;    Allergies  Allergies  Allergen Reactions   Poractant Alfa Other (See Comments)    Patient does not eat pork because of his culture   Pork-Derived Products Other (See Comments)    Patient does not eat pork because of his culture   Tape Other (See Comments)    Skin is sensitive    History of Present Illness    Joseandres Schwimmer is a PMH of hyperlipidemia, hypertension, postpolio syndrome, hyperlipidemia, tobacco abuse, chest pain, neuropathic pain, and is postcardiac catheterization with DES to his LAD 12/23/2015.  He was seen in follow-up by Dr. Debara Pickett on 07/25/2021.  He reported at that  time that he had been in the emergency department at [facility] for atypical type chest burning.  His troponins were negative.  He was having fatigue.  He indicated that he wanted to stop smoking.  He was smoking 2 packs/day.  Options for quitting were discussed.  He also was interested in medication for ED.  He is not a candidate for PDE 5 inhibitors due to long-acting nitrates.  At that time he did not have a PCP.  He was previously seen at [facility] but was dismissed from the practice due to no-shows.  He was referred to community health and wellness.  He presents to the clinic today for follow-up evaluation and states***  *** denies chest pain, shortness of breath, lower extremity edema, fatigue, palpitations, melena, hematuria, hemoptysis, diaphoresis, weakness, presyncope, syncope, orthopnea, and PND.  Hyperlipidemia- 07/25/2021: Cholesterol, Total 239; HDL 42; LDL Chol Calc (NIH) 111; Triglycerides 500 Continue aspirin, fenofibrate, lovastatin Heart healthy low-sodium high-fiber diet Increase physical activity as tolerated Repeat fasting lipids and LFTs  Coronary artery disease-no chest pain today.  Denies recent episodes of chest pressure or discomfort.  Underwent cardiac catheterization with PCI and DES to his LAD in 2017. Continue losartan, fenofibrate, aspirin, amlodipine, Imdur, lisinopril Heart healthy low-sodium diet-salty 6 given Increase physical activity as tolerated  Essential hypertension-BP today*** Continue metoprolol, lisinopril, Imdur Heart healthy low-sodium diet-salty 6 given Increase physical activity as tolerated  Postpolio syndrome-limited physical activity due to decreased range of motion in his lower  extremities. Maintain physical activity as tolerated Follows with PCP  Disposition: Follow-up with Dr. Debara Pickett in 12 months.  Home Medications    Prior to Admission medications   Medication Sig Start Date End Date Taking? Authorizing Provider  amLODipine  (NORVASC) 5 MG tablet TAKE 1 TABLET (5 MG TOTAL) BY MOUTH DAILY. 05/01/22 07/30/22  Pixie Casino, MD  amoxicillin (AMOXIL) 500 MG capsule Take 1 capsule (500 mg total) by mouth 3 (three) times daily. Patient not taking: Reported on 07/25/2021 11/25/20   Diona Browner, DMD  aspirin EC 81 MG tablet Take 81 mg by mouth daily.    [provider]  buPROPion (WELLBUTRIN SR) 150 MG 12 hr tablet TAKE 1 TABLET BY MOUTH TWICE A DAY FOR 12 WEEKS FOR SMOKING CESSATION 05/01/22   Hilty, Nadean Corwin, MD  fenofibrate (TRICOR) 145 MG tablet TAKE 1 TABLET BY MOUTH EVERY DAY 06/11/22   Hilty, Nadean Corwin, MD  ibuprofen (ADVIL) 800 MG tablet Take 400 mg by mouth every 6 (six) hours as needed for moderate pain.    [provider]  isosorbide mononitrate (IMDUR) 60 MG 24 hr tablet TAKE 1 TABLET BY MOUTH EVERY DAY 12/01/21   Hilty, Nadean Corwin, MD  lisinopril (ZESTRIL) 20 MG tablet TAKE 1 TABLET BY MOUTH EVERY DAY 01/08/22   Hilty, Nadean Corwin, MD  lovastatin (MEVACOR) 40 MG tablet Take 1 tablet (40 mg total) by mouth at bedtime. 08/19/21   Hilty, Nadean Corwin, MD  metoprolol tartrate (LOPRESSOR) 50 MG tablet TAKE 1 TABLET BY MOUTH EVERY MORNING AND 1/2 TABLET EVERY EVENING 05/01/22   Hilty, Nadean Corwin, MD  nitroGLYCERIN (NITROSTAT) 0.4 MG SL tablet DISSOLVE 1 TABLET UNDER TONGUE EVERY 5 MINUTES AS NEEDED FOR CHEST PAIN *MAX 3 TABLETS* Patient taking differently: Place 0.4 mg under the tongue every 5 (five) minutes as needed for chest pain. DISSOLVE 1 TABLET UNDER TONGUE EVERY 5 MINUTES AS NEEDED FOR CHEST PAIN *MAX 3 TABLETS* 07/22/20   Kroeger, Lorelee Cover., PA-C  oxyCODONE-acetaminophen (PERCOCET) 5-325 MG tablet Take 1 tablet by mouth every 4 (four) hours as needed. 11/25/20   Diona Browner, DMD    Family History    Family History  Problem Relation Age of Onset   Hypertension Mother    Heart failure Father    He indicated that his mother is alive. He indicated that his father is deceased. He indicated that his  maternal grandmother is deceased. He indicated that his maternal grandfather is deceased. He indicated that his paternal grandmother is deceased. He indicated that his paternal grandfather is deceased.  Social History    Social History   Socioeconomic History   Marital status: Married    Spouse name: Not on file   Number of children: 3   Years of education: 2 years of college   Highest education level: Not on file  Occupational History   Occupation: Architect  Tobacco Use   Smoking status: Every Day    Packs/day: 2.00    Types: Cigarettes   Smokeless tobacco: Never   Tobacco comments:    ultra lights    Hemp cigarettes 2 days ago  Vaping Use   Vaping Use: Never used  Substance and Sexual Activity   Alcohol use: Yes    Alcohol/week: 6.0 standard drinks of alcohol    Types: 6 Cans of beer per week    Comment: occasionally   Drug use: No   Sexual activity: Not on file  Other Topics Concern   Not  on file  Social History Narrative   Lives at home with wife and three children.   Right-handed.   5 cups tea per day.   Social Determinants of Health   Financial Resource Strain: Not on file  Food Insecurity: Not on file  Transportation Needs: Not on file  Physical Activity: Not on file  Stress: Not on file  Social Connections: Not on file  Intimate Partner Violence: Not on file     Review of Systems    General:  No chills, fever, night sweats or weight changes.  Cardiovascular:  No chest pain, dyspnea on exertion, edema, orthopnea, palpitations, paroxysmal nocturnal dyspnea. Dermatological: No rash, lesions/masses Respiratory: No cough, dyspnea Urologic: No hematuria, dysuria Abdominal:   No nausea, vomiting, diarrhea, bright red blood per rectum, melena, or hematemesis Neurologic:  No visual changes, wkns, changes in mental status. All other systems reviewed and are otherwise negative except as noted above.  Physical Exam    VS:  There were no vitals taken for  this visit. , BMI There is no height or weight on file to calculate BMI. GEN: Well nourished, well developed, in no acute distress. HEENT: normal. Neck: Supple, no JVD, carotid bruits, or masses. Cardiac: RRR, no murmurs, rubs, or gallops. No clubbing, cyanosis, edema.  Radials/DP/PT 2+ and equal bilaterally.  Respiratory:  Respirations regular and unlabored, clear to auscultation bilaterally. GI: Soft, nontender, nondistended, BS + x 4. MS: no deformity or atrophy. Skin: warm and dry, no rash. Neuro:  Strength and sensation are intact. Psych: Normal affect.  Accessory Clinical Findings    Recent Labs: 07/25/2021: ALT 30; BUN 6; Creatinine, Ser 0.71; Hemoglobin 15.2; Platelets 234; Potassium 4.3; Sodium 139; TSH 1.940   Recent Lipid Panel    Component Value Date/Time   CHOL 239 (H) 07/25/2021 0901   TRIG 500 (H) 07/25/2021 0901   HDL 42 07/25/2021 0901   CHOLHDL 5.7 (H) 07/25/2021 0901   CHOLHDL 6.3 (H) 12/18/2015 1016   VLDL 60 (H) 12/18/2015 1016   LDLCALC 111 (H) 07/25/2021 0901    No BP recorded.  {Refresh Note OR Click here to enter BP  :1}***    ECG personally reviewed by me today- *** - No acute changes  Cardiac catheterization 12/23/2015 Prox RCA lesion, 100 %stenosed. Prox Cx lesion, 40 %stenosed. Dist Cx lesion, 40 %stenosed. There is mild left ventricular systolic dysfunction. LV end diastolic pressure is normal. The left ventricular ejection fraction is 45-50% by visual estimate. There is no mitral valve regurgitation. A drug eluting stent was successfully placed. Prox LAD lesion, 95 %stenosed. Post intervention, there is a 0% residual stenosis.   1. Double vessel CAD with unstable angina.  2. Chronic occlusion of the proximal RCA with filling of the mid and distal vessel from left to right collaterals.  3. Severe stenosis proximal LAD 4. Moderate proximal Circumflex stenosis 5. Successful PTCA/DES x 1 proximal LAD 6. Mild LV systolic dysfunction    Recommendations: Will continue ASA, Plavix for one year. Continue beta blocker and statin. Would consider changing to high dose Lipitor at discharge. Smoking cessation.   Diagnostic Dominance: Right  Intervention      Nuclear stress test 10/18/2019  The left ventricular ejection fraction is mildly decreased (45-54%). Nuclear stress EF: 47%. There was no ST segment deviation noted during stress. No T wave inversion was noted during stress. Findings consistent with prior myocardial infarction. This is an intermediate risk study.   Impression:   1. There is a fixed,  medium size (5-8% of LV), moderate to severe perfusion defect present in the mid anterior and apical anterior segments consistent with prior infarction. This is consistent the patient's history of prior LAD stent.  2. There is no ischemia present.  3. LVEF is mildly reduced, 47%.  4. This is an intermediate risk study.  5. There is no change from the prior study.     Assessment & Plan   1.  ***   Jossie Ng. Aniket Paye NP-C     07/15/2022, 1:11 PM [facility] [address] Office [phone] Fax [phone]    I spent***minutes examining this patient, reviewing medications, and using patient centered shared decision making involving her cardiac care.  Prior to her visit I spent greater than 20 minutes reviewing her past medical history,  medications, and prior cardiac tests.

## 2022-07-17 ENCOUNTER — Ambulatory Visit: Payer: Medicaid Other | Admitting: General Practice

## 2022-08-19 ENCOUNTER — Ambulatory Visit: Payer: Medicaid Other | Attending: General Practice | Admitting: Nurse Practitioner

## 2022-08-19 NOTE — Progress Notes (Deleted)
Office Visit    Patient Name: John Mcintyre Date of Encounter: 08/19/2022  Primary Care Provider:  Pcp, No Primary Cardiologist:  Pixie Casino, MD  Chief Complaint    52 year old male with a history of CAD s/p DES-LAD in 2017, CTO-RCA with left-to-right collaterals, hypertension, hyperlipidemia, polio, ED, and tobacco use who presents for follow-up related to CAD and hypertension.  Past Medical History    Past Medical History:  Diagnosis Date   Coronary artery disease    Hypercholesteremia    Hypertension    Myocardial infarction [facility])    Polio    Past Surgical History:  Procedure Laterality Date   CARDIAC CATHETERIZATION N/A 12/23/2015   Procedure: Left Heart Cath and Coronary Angiography;  Surgeon: Burnell Blanks, MD;  Location: [place] [facility];  Service: Cardiovascular;  Laterality: N/A;   CARDIAC CATHETERIZATION N/A 12/23/2015   Procedure: Coronary Stent Intervention;  Surgeon: Burnell Blanks, MD;  Location: [place] [facility];  Service: Cardiovascular;  Laterality: N/A;   CORONARY STENT PLACEMENT  12/23/2015   Successful PTCA/DES x 1 proximal LAD   HIP SURGERY Left 1987   for polio, not sure what was done   KNEE SURGERY Left 1987   for polio   TOOTH EXTRACTION N/A 11/25/2020   Procedure: DENTAL RESTORATION/EXTRACTIONS;  Surgeon: Diona Browner, DMD;  Location: [place];  Service: Oral Surgery;  Laterality: N/A;    Allergies  Allergies  Allergen Reactions   Poractant Alfa Other (See Comments)    Patient does not eat pork because of his culture   Pork-Derived Products Other (See Comments)    Patient does not eat pork because of his culture   Tape Other (See Comments)    Skin is sensitive     Labs/Other Studies Reviewed    The following studies were reviewed today: Lexiscan myoview 09/2019: The left ventricular ejection fraction is mildly decreased (45-54%). Nuclear stress EF: 47%. There was no ST segment deviation noted during stress. No  T wave inversion was noted during stress. Findings consistent with prior myocardial infarction. This is an intermediate risk study.   Impression:   1. There is a fixed, medium size (5-8% of LV), moderate to severe perfusion defect present in the mid anterior and apical anterior segments consistent with prior infarction. This is consistent the patient's history of prior LAD stent.  2. There is no ischemia present.  3. LVEF is mildly reduced, 47%.  4. This is an intermediate risk study.  5. There is no change from the prior study.   Recent Labs: No results found for requested labs within last 365 days.  Recent Lipid Panel    Component Value Date/Time   CHOL 239 (H) 07/25/2021 0901   TRIG 500 (H) 07/25/2021 0901   HDL 42 07/25/2021 0901   CHOLHDL 5.7 (H) 07/25/2021 0901   CHOLHDL 6.3 (H) 12/18/2015 1016   VLDL 60 (H) 12/18/2015 1016   LDLCALC 111 (H) 07/25/2021 0901    History of Present Illness    52 year old male with the above past medical history including CAD s/p DES-LAD in 2017, CTO-RCA with left-to-right collaterals, hypertension, hyperlipidemia, polio, ED, and tobacco use.  He has a history of CAD s/p DES-LAD in 2017, CTO-RCA with left-to-right collaterals, managed medically.  He has had recurrent chest pain.  Most recent stress test in 2021 showed EF 47%, fixed, medium sized, moderate to severe perfusion defect in the mid anterior and apical anterior segments consistent with prior infarction, no ischemia,  no change from prior study.  He was evaluated in the ED in January 2023 in the setting of chest pain.  Troponin was negative.  He was last seen in the office on 07/25/2021.  He continued to smoke at the time.  He was started on Wellbutrin x 3 months.  No further cardiac testing was recommended.  He noted he had been discharged from his PCP due to no-shows.  He was referred to community health and wellness clinic for primary care needs.  He presents today for follow-up.  Since  his last visit  CAD/chest pain: Hypertension: Hyperlipidemia: Tobacco use: Disposition:  Home Medications    Current Outpatient Medications  Medication Sig Dispense Refill   amLODipine (NORVASC) 5 MG tablet TAKE 1 TABLET (5 MG TOTAL) BY MOUTH DAILY. 90 tablet 0   amoxicillin (AMOXIL) 500 MG capsule Take 1 capsule (500 mg total) by mouth 3 (three) times daily. (Patient not taking: Reported on 07/25/2021) 21 capsule 0   aspirin EC 81 MG tablet Take 81 mg by mouth daily.     buPROPion (WELLBUTRIN SR) 150 MG 12 hr tablet TAKE 1 TABLET BY MOUTH TWICE A DAY FOR 12 WEEKS FOR SMOKING CESSATION 60 tablet 2   fenofibrate (TRICOR) 145 MG tablet TAKE 1 TABLET BY MOUTH EVERY DAY 30 tablet 6   ibuprofen (ADVIL) 800 MG tablet Take 400 mg by mouth every 6 (six) hours as needed for moderate pain.     isosorbide mononitrate (IMDUR) 60 MG 24 hr tablet TAKE 1 TABLET BY MOUTH EVERY DAY 90 tablet 3   lisinopril (ZESTRIL) 20 MG tablet TAKE 1 TABLET BY MOUTH EVERY DAY 90 tablet 2   lovastatin (MEVACOR) 40 MG tablet Take 1 tablet (40 mg total) by mouth at bedtime. 90 tablet 3   metoprolol tartrate (LOPRESSOR) 50 MG tablet TAKE 1 TABLET BY MOUTH EVERY MORNING AND 1/2 TABLET EVERY EVENING 45 tablet 6   nitroGLYCERIN (NITROSTAT) 0.4 MG SL tablet DISSOLVE 1 TABLET UNDER TONGUE EVERY 5 MINUTES AS NEEDED FOR CHEST PAIN *MAX 3 TABLETS* (Patient taking differently: Place 0.4 mg under the tongue every 5 (five) minutes as needed for chest pain. DISSOLVE 1 TABLET UNDER TONGUE EVERY 5 MINUTES AS NEEDED FOR CHEST PAIN *MAX 3 TABLETS*) 25 tablet 6   oxyCODONE-acetaminophen (PERCOCET) 5-325 MG tablet Take 1 tablet by mouth every 4 (four) hours as needed. 30 tablet 0   No current facility-administered medications for this visit.     Review of Systems    ***.  All other systems reviewed and are otherwise negative except as noted above.    Physical Exam    VS:  There were no vitals taken for this visit. , BMI There is no  height or weight on file to calculate BMI.     GEN: Well nourished, well developed, in no acute distress. HEENT: normal. Neck: Supple, no JVD, carotid bruits, or masses. Cardiac: RRR, no murmurs, rubs, or gallops. No clubbing, cyanosis, edema.  Radials/DP/PT 2+ and equal bilaterally.  Respiratory:  Respirations regular and unlabored, clear to auscultation bilaterally. GI: Soft, nontender, nondistended, BS + x 4. MS: no deformity or atrophy. Skin: warm and dry, no rash. Neuro:  Strength and sensation are intact. Psych: Normal affect.  Accessory Clinical Findings    ECG personally reviewed by me today - *** - no acute changes.   Lab Results  Component Value Date   WBC 11.5 (H) 07/25/2021   HGB 15.2 07/25/2021   HCT 44.5 07/25/2021  MCV 89 07/25/2021   PLT 234 07/25/2021   Lab Results  Component Value Date   CREATININE 0.71 (L) 07/25/2021   BUN 6 07/25/2021   NA 139 07/25/2021   K 4.3 07/25/2021   CL 102 07/25/2021   CO2 23 07/25/2021   Lab Results  Component Value Date   ALT 30 07/25/2021   AST 23 07/25/2021   ALKPHOS 111 07/25/2021   BILITOT 0.2 07/25/2021   Lab Results  Component Value Date   CHOL 239 (H) 07/25/2021   HDL 42 07/25/2021   LDLCALC 111 (H) 07/25/2021   TRIG 500 (H) 07/25/2021   CHOLHDL 5.7 (H) 07/25/2021    Lab Results  Component Value Date   HGBA1C 5.6 07/25/2021    Assessment & Plan    1.  ***  No BP recorded.  {Refresh Note OR Click here to enter BP  :1}***   Lenna Sciara, NP 08/19/2022, 10:16 AM

## 2022-09-24 ENCOUNTER — Other Ambulatory Visit: Payer: Self-pay | Admitting: Internal Medicine

## 2022-11-04 ENCOUNTER — Other Ambulatory Visit: Payer: Self-pay | Admitting: Internal Medicine

## 2022-11-04 NOTE — Telephone Encounter (Signed)
Pt's pharmacy is requesting a refill on bupropion. Would Dr. Rennis Golden like to refill this medication? Please address

## 2022-12-02 ENCOUNTER — Ambulatory Visit: Payer: Medicaid Other | Attending: Student | Admitting: Student

## 2022-12-02 ENCOUNTER — Encounter: Payer: Self-pay | Admitting: Student

## 2022-12-02 VITALS — BP 142/102 | HR 86 | Ht 65.0 in | Wt 185.2 lb

## 2022-12-02 DIAGNOSIS — I519 Heart disease, unspecified: Secondary | ICD-10-CM | POA: Diagnosis not present

## 2022-12-02 DIAGNOSIS — Z720 Tobacco use: Secondary | ICD-10-CM

## 2022-12-02 DIAGNOSIS — I10 Essential (primary) hypertension: Secondary | ICD-10-CM | POA: Diagnosis not present

## 2022-12-02 DIAGNOSIS — E785 Hyperlipidemia, unspecified: Secondary | ICD-10-CM

## 2022-12-02 DIAGNOSIS — I2510 Atherosclerotic heart disease of native coronary artery without angina pectoris: Secondary | ICD-10-CM

## 2022-12-02 DIAGNOSIS — F1010 Alcohol abuse, uncomplicated: Secondary | ICD-10-CM

## 2022-12-02 MED ORDER — LISINOPRIL 40 MG PO TABS: 40.0000 mg | ORAL_TABLET | Freq: Every day | ORAL | 3 refills | Status: DC

## 2022-12-02 MED ORDER — NITROGLYCERIN 0.4 MG SL SUBL
SUBLINGUAL_TABLET | SUBLINGUAL | 6 refills | Status: AC
Start: 2022-12-02 — End: ?

## 2022-12-02 MED ORDER — ATORVASTATIN CALCIUM 40 MG PO TABS: 40.0000 mg | ORAL_TABLET | Freq: Every day | ORAL | 3 refills | Status: AC

## 2022-12-02 NOTE — Patient Instructions (Signed)
Medication Instructions:   STOP THE LOVASTATIN 40MG  START ATORVASTATIN 40MG  ONE DAILY. THIS MEDICATION HAS BEEN SENT TO YOUR PHARMACY START LISINOPRIL 40MG  DAILY. THIS MEDICATION HAS BEEN SENT TO YOUR PHARMACY  *If you need a refill on your cardiac medications before your next appointment, please call your pharmacy*   Lab Work: RETURN IN ONE WEEK FOR FASTING LABS If you have labs (blood work) drawn today and your tests are completely normal, you will receive your results only by: MyChart Message (if you have MyChart) OR A paper copy in the mail If you have any lab test that is abnormal or we need to change your treatment, we will call you to review the results.   Testing/Procedures: NONE   Follow-Up: At [facility], you and your health needs are our priority.  As part of our continuing mission to provide you with exceptional heart care, we have created designated Provider Care Teams.  These Care Teams include your primary Cardiologist (physician) and Advanced Practice Providers (APPs -  Physician Assistants and Nurse Practitioners) who all work together to provide you with the care you need, when you need it.  We recommend signing up for the patient portal called "MyChart".  Sign up information is provided on this After Visit Summary.  MyChart is used to connect with patients for Virtual Visits (Telemedicine).  Patients are able to view lab/test results, encounter notes, upcoming appointments, etc.  Non-urgent messages can be sent to your provider as well.   To learn more about what you can do with MyChart, go to [email or web address].    Your next appointment:   1 year(s)  Provider:   Chrystie Nose, MD

## 2022-12-02 NOTE — Progress Notes (Signed)
Cardiology Office Note:    Date:  12/02/2022   ID:  John Mcintyre, DOB 09/18/1970, MRN [id number]  PCP:  Pcp, No  Cardiologist:  Chrystie Nose, MD  Electrophysiologist:  None   Referring MD: No ref. provider found   Chief Complaint: routine follow-up given CAD  History of Present Illness:    John Mcintyre is a 52 y.o. male with a history of CAD with DES to LAD and CTO of RCA on cardiac catheterization in 2017, mild LV dysfunction with no signs or symptoms of CHF, hypertension, hyperlipidemia, polio, tobacco abuse, and alcohol abuse who is followed by Dr. Rennis Golden and presents today for routine follow-up.  Patient has been followed by Dr. Rennis Golden since 2016. He has had intermittent chest pain over the years and has had multiple stress tests in the past. Cardiac catheterization in 11/2015 which showed 95% stenosis of proximal LAD, CTO of proximal RCA with collaterals, and 40% stenosis of LCX. LVEF was mildly reduced at 45-50%.  He underwent successful PCI with DES to LAD. Last ischemic evaluation was a Myoview in 09/2019 which showed a fixed defect in the mid anterior and apical anterior segment consistent with prior infarction but no ischemia. EF 47% at that time. There was no significant changes from prior Myoview in 2019. Continue medical therapy was recommended and his Imdur was increased. He was last seen by Dr. Rennis Golden in 07/2021 after recent ED visit for atypical chest pain that he described as a burning sensation. Troponin was negative in the ED. No additional evaluation or medication changes were recommended at that time.   Patient presents today for follow-up. 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 He states he is doing well from a cardiac standpoint. He denies any chest pain. No shortness of breath, orthopnea, or PND. He reports intermittent lower extremity edema (mostly of his right) leg that he has had for years. He states the edema typically last for a couple of days at a time  and then resolves. He currently has some mild right pedal edema. Overall stable. He denies any palpitations, lightheadedness, dizziness, syncope. He continues to smoke 2 packs of cigarettes per day. He reports significant alcohol use and reports drinking 1/2 a bottle of whisky almost daily.  LFTs were normal in 07/2022.   His main reason for coming in today was to ask for paperwork to be completed for him to have caretaker come to help him at his home given mobility issues. He also requested a wheelchair and shower equipment. 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   EKGs/Labs/Other Studies Reviewed:    The following studies were reviewed:  Left Cardiac Catheterization 12/23/2015: Prox RCA lesion, 100 %stenosed. Prox Cx lesion, 40 %stenosed. Dist Cx lesion, 40 %stenosed. There is mild left ventricular systolic dysfunction. LV end diastolic pressure is normal. The left ventricular ejection fraction is 45-50% by visual estimate. There is no mitral valve regurgitation. A drug eluting stent was successfully placed. Prox LAD lesion, 95 %stenosed. Post intervention, there is a 0% residual stenosis.   1. Double vessel CAD with unstable angina.  2. Chronic occlusion of the proximal RCA with filling of the mid and distal vessel from left to right collaterals.  3. Severe stenosis proximal LAD 4. Moderate proximal Circumflex stenosis 5. Successful PTCA/DES x 1 proximal LAD 6. Mild LV systolic dysfunction   Recommendations: Will continue ASA, Plavix  for one year. Continue beta blocker and statin. Would consider changing to high dose Lipitor at discharge. Smoking cessation.  _______________  Myoview 10/18/2019: The left ventricular ejection fraction is mildly decreased (45-54%). Nuclear stress EF: 47%. There was no ST segment deviation noted during stress. No T wave inversion was noted during stress. Findings consistent  with prior myocardial infarction. This is an intermediate risk study.   Impression: 1. There is a fixed, medium size (5-8% of LV), moderate to severe perfusion defect present in the mid anterior and apical anterior segments consistent with prior infarction. This is consistent the patient's history of prior LAD stent.  2. There is no ischemia present.  3. LVEF is mildly reduced, 47%.  4. This is an intermediate risk study.  5. There is no change from the prior study.    EKG:  EKG ordered today.   EKG Interpretation Date/Time:  Wednesday December 02 2022 17:00:06 EDT Ventricular Rate:  76 PR Interval:  168 QRS Duration:  98 QT Interval:  386 QTC Calculation: 434 R Axis:   67  Text Interpretation: Normal sinus rhythm. No acute ST/T changes. Possible Q waves in leads V1-V3. Confirmed by Marjie Skiff [phone]) on 12/02/2022 5:14:23 PM    Recent Labs: No results found for requested labs within last 365 days.  Recent Lipid Panel    Component Value Date/Time   CHOL 239 (H) 07/25/2021 0901   TRIG 500 (H) 07/25/2021 0901   HDL 42 07/25/2021 0901   CHOLHDL 5.7 (H) 07/25/2021 0901   CHOLHDL 6.3 (H) 12/18/2015 1016   VLDL 60 (H) 12/18/2015 1016   LDLCALC 111 (H) 07/25/2021 0901    Physical Exam:    Vital Signs: BP (!) 160/100 (BP Location: Left Arm, Patient Position: Sitting, Cuff Size: Normal)   Pulse 86   Ht 5\' 5"  (1.651 m)   Wt 185 lb 3.2 oz (84 kg)   SpO2 97%   BMI 30.82 kg/m     Wt Readings from Last 3 Encounters:  12/02/22 185 lb 3.2 oz (84 kg)  07/25/21 169 lb 6.4 oz (76.8 kg)  06/26/21 170 lb (77.1 kg)     General: 52 y.o. male in no acute distress. HEENT: Normocephalic and atraumatic. Sclera clear. EOMs intact. Neck: Supple. No carotid bruits. No JVD. Heart: RRR. Distinct S1 and S2. No murmurs, gallops, or rubs.  Lungs: No increased work of breathing. Clear to ausculation bilaterally. No wheezes, rhonchi, or rales.  Abdomen: Soft, non-distended, and non-tender to  palpation.  Extremities: Mild pedal edema of right lower extremity.  Skin: Warm and dry. Neuro: Alert and oriented x3. No focal deficits. Psych: Normal affect. Responds appropriately.   Assessment:    No diagnosis found.  Plan:    CAD Patient has a history of intermittent chest pain over the years. LHC in 2017 showed 95% stenosis of proximal LAD and CTO of RCA with collaterals. He underwent PCI with DES to LAD at that time. Last ischemic evaluation was a Myoview in 2021 for recurrent chest pain and showed a fixed defect in the mid anterior and apical anterior segment consistent with prior infarction but no ischemia. Continue medical therapy was recommended and Imdur was increased.  - Doing well. No angina. - Continue Imdur 60mg  daily and Lopressor 50mg  in the morning and 25mg  in the evening. - Continue aspirin, beta-blocker, statin.  Mild LV Dysfunction Noted on LHC in 2017 with LVEF of 45-50% by visual estimate. LVEDP was normal at that time. EF was 47%  on last Myoview in 2021. It does not look like he has ever had an Echo. - Continue Lopressor 50mg  in the morning and 25mg  in the evening. - Will increase Lisinopril to 40mg  daily for better BP control.  - Will hold off on Echo for now given no signs or symptoms of CHF.  Hypertension BP elevated. Initially 160/100 and then 142/102 on my personal recheck at the end of visit.  - Current medications: Amlodipine 5mg  daily, Lisinopril 20mg  daily, Imdur 60mg  daily, and Lopressor 50mg  in the morning and 25mg  in the evening. He states PCP increased his Lisinopril to 40mg  daily a couple of months ago but he has not started taking the higher dose. Recommended he go ahead and do this. Continue current dose of Amlodipine, Imdur, and Lopressor for now. - Asked patient to continue to monitor his BP at home and let us know if consistently >130/80. If that happens, can increase Amlodipine or switch from Lopressor to Coreg (this may be the better option  given mildly reduced EF).  - Will repeat BMET in 1 week after increasing Lisinopril.   Hyperlipidemia Lipid panel in 07/2022 in Care Everywhere: Total Cholesterol 228, Triglycerides 279, HDL 40, LDL 155. LDL goal <70.  - Currently on Lovastatin 40mg  daily. Will stop this and switch to Lipitor 40mg  daily.  - Continue Fenofibrate 145mg  daily.  - Will repeat lipid panel and LFTs in 3 months.   Tobacco Abuse Patient unfortunately continues to smoke 2 packers per day.  - Discussed the importance of complete cessation. He does not seem ready to quit at this time.  Alcohol Abuse Patient also endorses significant alcohol use and reports drinking 1/2 bottle of whiskey almost daily. LFTs normal in 07/2022.  - Discussed importance of cutting back on alcohol consumption and drinking in moderation.   Disposition: Follow up in 1 year.    Medication Adjustments/Labs and Tests Ordered: Current medicines are reviewed at length with the patient today.  Concerns regarding medicines are outlined above.  No orders of the defined types were placed in this encounter.  No orders of the defined types were placed in this encounter.   There are no Patient Instructions on file for this visit.   Signed, Corrin Parker, PA-C  12/02/2022 4:30 PM    [facility]

## 2022-12-16 ENCOUNTER — Other Ambulatory Visit: Payer: Self-pay | Admitting: Internal Medicine

## 2022-12-16 DIAGNOSIS — Z9861 Coronary angioplasty status: Secondary | ICD-10-CM

## 2022-12-16 DIAGNOSIS — I10 Essential (primary) hypertension: Secondary | ICD-10-CM

## 2023-01-14 ENCOUNTER — Other Ambulatory Visit: Payer: Self-pay | Admitting: Internal Medicine

## 2023-01-15 ENCOUNTER — Other Ambulatory Visit: Payer: Self-pay | Admitting: Internal Medicine

## 2023-02-09 ENCOUNTER — Other Ambulatory Visit: Payer: Self-pay | Admitting: Internal Medicine

## 2023-04-08 ENCOUNTER — Emergency Department (HOSPITAL_COMMUNITY)
Admission: EM | Admit: 2023-04-08 | Discharge: 2023-04-08 | Disposition: A | Discharge status: Home / Self Care | Payer: Medicaid Other

## 2023-04-08 ENCOUNTER — Emergency Department (HOSPITAL_COMMUNITY): Payer: Medicaid Other

## 2023-04-08 ENCOUNTER — Encounter (HOSPITAL_COMMUNITY): Payer: Self-pay

## 2023-04-08 DIAGNOSIS — R600 Localized edema: Secondary | ICD-10-CM | POA: Diagnosis not present

## 2023-04-08 DIAGNOSIS — Z7982 Long term (current) use of aspirin: Secondary | ICD-10-CM | POA: Insufficient documentation

## 2023-04-08 DIAGNOSIS — M7989 Other specified soft tissue disorders: Secondary | ICD-10-CM | POA: Diagnosis present

## 2023-04-08 MED ORDER — POLYETHYLENE GLYCOL 3350 17 G PO PACK: 17.0000 g | PACK | Freq: Every day | ORAL | 0 refills | Status: AC

## 2023-04-08 MED ORDER — POLYETHYLENE GLYCOL 3350 17 G PO PACK
17.0000 g | PACK | Freq: Every day | ORAL | Status: DC
  Administered 2023-04-08: 17 g via ORAL
  Filled 2023-04-08: qty 1

## 2023-04-08 MED ORDER — ACETAMINOPHEN 325 MG PO TABS
650.0000 mg | ORAL_TABLET | Freq: Once | ORAL | Status: AC
Start: 2023-04-08 — End: 2023-04-08
  Administered 2023-04-08: 650 mg via ORAL
  Filled 2023-04-08: qty 2

## 2023-04-08 NOTE — ED Triage Notes (Signed)
Pt presents with c/o right leg swelling for the past 8-10 days. Pt sent from UC to r/o possible blood clot. Leg is notably swollen.

## 2023-04-08 NOTE — Discharge Instructions (Signed)
Please wear compression stockings to help with your lower extremity edema.  Please follow-up with your primary doctor.  You may also elevate your legs to help reduce the edema.  We are also prescribing you MiraLAX.  Please take 1 capful a day until you have soft bowel movement and then titrate to effect.

## 2023-04-08 NOTE — ED Provider Notes (Signed)
[place] EMERGENCY DEPARTMENT AT [facility] Provider Note   CSN: [id number] Arrival date & time: 04/08/23  1509     History  Chief Complaint  Patient presents with   Leg Swelling    John Mcintyre is a 52 y.o. male.  51 year old male present emergency department with leg swelling primarily to the right foot and shin.  Reports been intermittent for the past 7 days.  Was sent here to rule out blood clot.        Home Medications Prior to Admission medications   Medication Sig Start Date End Date Taking? Authorizing Provider  amLODipine (NORVASC) 5 MG tablet TAKE 1 TABLET (5 MG TOTAL) BY MOUTH DAILY. 12/16/22 03/16/23  Hilty, Lisette Abu, MD  amoxicillin (AMOXIL) 500 MG capsule Take 1 capsule (500 mg total) by mouth 3 (three) times daily. 11/25/20   [name], DMD  aspirin EC 81 MG tablet Take 81 mg by mouth daily.    [provider]  atorvastatin (LIPITOR) 40 MG tablet Take 1 tablet (40 mg total) by mouth daily. 12/02/22 03/02/23  Marjie Skiff E, PA-C  buPROPion (WELLBUTRIN SR) 150 MG 12 hr tablet TAKE 1 TABLET BY MOUTH TWICE A DAY FOR 12 WEEKS FOR SMOKING CESSATION 02/10/23   Hilty, Lisette Abu, MD  fenofibrate (TRICOR) 145 MG tablet TAKE 1 TABLET BY MOUTH EVERY DAY 01/15/23   Hilty, Lisette Abu, MD  ibuprofen (ADVIL) 800 MG tablet Take 400 mg by mouth every 6 (six) hours as needed for moderate pain.    [provider]  isosorbide mononitrate (IMDUR) 60 MG 24 hr tablet TAKE 1 TABLET BY MOUTH EVERY DAY 12/16/22   Hilty, Lisette Abu, MD  lisinopril (ZESTRIL) 40 MG tablet Take 1 tablet (40 mg total) by mouth daily. 12/02/22 03/02/23  Marjie Skiff E, PA-C  metoprolol tartrate (LOPRESSOR) 50 MG tablet TAKE 1 TABLET BY MOUTH EVERY MORNING AND 1/2 TABLET EVERY EVENING 05/01/22   Hilty, Lisette Abu, MD  nitroGLYCERIN (NITROSTAT) 0.4 MG SL tablet DISSOLVE 1 TABLET UNDER TONGUE EVERY 5 MINUTES AS NEEDED FOR CHEST PAIN *MAX 3 TABLETS* 12/02/22   Marjie Skiff E, PA-C   oxyCODONE-acetaminophen (PERCOCET) 5-325 MG tablet Take 1 tablet by mouth every 4 (four) hours as needed. 11/25/20   [name], DMD      Allergies    Poractant alfa, Pork-derived products, and Tape    Review of Systems   Review of Systems  Physical Exam Updated Vital Signs BP (!) 141/87 (BP Location: Right Arm)   Pulse 84   Temp 98.6 F (37 C) (Oral)   Resp 18   SpO2 97%  Physical Exam Vitals and nursing note reviewed.  Constitutional:      General: He is not in acute distress.    Appearance: He is not toxic-appearing.  HENT:     Head: Normocephalic.     Nose: Nose normal.     Mouth/Throat:     Mouth: Mucous membranes are moist.  Cardiovascular:     Rate and Rhythm: Normal rate.  Pulmonary:     Effort: Pulmonary effort is normal.  Abdominal:     General: Abdomen is flat. There is no distension.     Tenderness: There is no abdominal tenderness. There is no guarding or rebound.  Musculoskeletal:        General: Normal range of motion.     Right lower leg: Edema present.     Comments: [place]me[place] to dorsum foot and ankle.   Skin:  General: Skin is warm and dry.     Capillary Refill: Capillary refill takes less than 2 seconds.  Neurological:     Mental Status: He is alert and oriented to person, place, and time.  Psychiatric:        Mood and Affect: Mood normal.        Behavior: Behavior normal.     ED Results / Procedures / Treatments   Labs (all labs ordered are listed, but only abnormal results are displayed) Labs Reviewed - No data to display  EKG None  Radiology VAS [name] LOWER EXTREMITY VENOUS (DVT) (ONLY [facility] & [facility])  Result Date: 04/08/2023  Lower Venous DVT Study Patient Name:  John Mcintyre  Date of Exam:   04/08/2023 Medical Rec #: [id number]       Accession #:    [id number] Date of Birth: Jun 19, 1970        Patient Gender: M Patient Age:   13 years Exam Location:  [facility] Procedure:      VAS [name] LOWER EXTREMITY VENOUS (DVT) Referring Phys: LESLIE  SOFIA --------------------------------------------------------------------------------  Indications: Swelling, and elevated d-dimer.  Comparison Study: No priors. Performing Technologist: Marilynne Halsted RDMS, RVT  Examination Guidelines: A complete evaluation includes B-mode imaging, spectral Doppler, color Doppler, and power Doppler as needed of all accessible portions of each vessel. Bilateral testing is considered an integral part of a complete examination. Limited examinations for reoccurring indications may be performed as noted. The reflux portion of the exam is performed with the patient in reverse Trendelenburg.  +---------+---------------+---------+-----------+----------+--------------+ RIGHT    CompressibilityPhasicitySpontaneityPropertiesThrombus Aging +---------+---------------+---------+-----------+----------+--------------+ CFV      Full           Yes      Yes                                 +---------+---------------+---------+-----------+----------+--------------+ SFJ      Full                                                        +---------+---------------+---------+-----------+----------+--------------+ FV Prox  Full                                                        +---------+---------------+---------+-----------+----------+--------------+ FV Mid   Full                                                        +---------+---------------+---------+-----------+----------+--------------+ FV DistalFull                                                        +---------+---------------+---------+-----------+----------+--------------+ PFV      Full                                                        +---------+---------------+---------+-----------+----------+--------------+  POP      Full           Yes      Yes                                 +---------+---------------+---------+-----------+----------+--------------+ PTV      Full                                                         +---------+---------------+---------+-----------+----------+--------------+ PERO     Full                                                        +---------+---------------+---------+-----------+----------+--------------+   +----+---------------+---------+-----------+----------+--------------+ LEFTCompressibilityPhasicitySpontaneityPropertiesThrombus Aging +----+---------------+---------+-----------+----------+--------------+ CFV Full           Yes      Yes                                 +----+---------------+---------+-----------+----------+--------------+ SFJ Full                                                        +----+---------------+---------+-----------+----------+--------------+     Summary: RIGHT: - There is no evidence of deep vein thrombosis in the lower extremity.  - No cystic structure found in the popliteal fossa.  LEFT: - No evidence of common femoral vein obstruction.   *See table(s) above for measurements and observations. Electronically signed by Heath Lark on 04/08/2023 at 6:13:58 PM.    Final     Procedures Procedures    Medications Ordered in ED Medications - No data to display  ED Course/ Medical Decision Making/ A&P                                 Medical Decision Making 52 year old male presenting to the emergency department for evaluation of possible DVT.  Afebrile vital signs reassuring.  Physical exam consistent with dependent edema.  Patient is a Scientist, research (medical).  No erythema or swelling.  2+ DP pulses.  Ultrasound negative for DVT.  Discussed supportive care with compression stockings and foot elevation.  Also complained of some constipation for the past 2 days, no abdominal pain.  Benign abdominal exam.  Will give MiraLAX.          Final Clinical Impression(s) / ED Diagnoses Final diagnoses:  None    Rx / DC Orders ED Discharge Orders     None         Coral Spikes,  DO 04/08/23 2108

## 2023-04-08 NOTE — ED Provider Triage Note (Signed)
Emergency Medicine Provider Triage Evaluation Note  Mieczyslaw Stamas , a 52 y.o. male  was evaluated in triage.  Pt complains of foot and leg swelling.  Pt had a positive ddimer at [facility]  Review of Systems  Positive: Foot and leg swelling Negative: fever Physical Exam  BP (!) 135/97 (BP Location: Right Arm)   Pulse 88   Temp 98.6 F (37 C) (Oral)   Resp 18   SpO2 98%  Gen:   Awake, no distress   Resp:  Normal effort  MSK:   Swollen right leg  Other:    Medical Decision Making  Medically screening exam initiated at 4:44 PM.  Appropriate orders placed.  Oddis Makarewicz was informed that the remainder of the evaluation will be completed by another provider, this initial triage assessment does not replace that evaluation, and the importance of remaining in the ED until their evaluation is complete.     Elson Areas, [place] 04/08/23 1648

## 2023-04-08 NOTE — Progress Notes (Signed)
Right lower extremity venous duplex  has been completed. Refer to [facility] under chart review to view preliminary results.   04/08/2023  6:13 PM John Mcintyre, Gerarda Gunther

## 2023-05-04 ENCOUNTER — Other Ambulatory Visit: Payer: Self-pay | Admitting: Internal Medicine

## 2023-05-07 ENCOUNTER — Telehealth: Payer: Self-pay | Admitting: Internal Medicine

## 2023-05-07 NOTE — Telephone Encounter (Signed)
Patient identification verified by 2 forms. Marilynn Rail, RN    [employer] interpreter: Gloriajean Dell [phone]  Called and spoke to patient  Patient states:   -has left side chest pain, also located on back and underarm   -started 2 days ago   -chest pain comes and goes   -feels like a tight burning pain   -when he took NTG it provided some relief to pain   -had issue with feet swelling 2 months ago, has used compression stocks   -also has history of swelling had catheter   -currently having some chest pain 3/10   -has not been taking some medications for 10 days  Patient denies:   -SOB/difficulty breathing  Advised patient to present to ER for evaluation, patient agrees, advised patient to outreach for f/u Patient agrees with plan no questions at this time

## 2023-05-07 NOTE — Telephone Encounter (Signed)
   Pt c/o of Chest Pain: STAT if active CP, including tightness, pressure, jaw pain, radiating pain to shoulder/upper arm/back, CP unrelieved by Nitro. Symptoms reported of SOB, nausea, vomiting, sweating.  1. Are you having CP right now? Not at this time , left side chest pain    2. Are you experiencing any other symptoms (ex. SOB, nausea, vomiting, sweating)? Feet swelling, back pain and feels tired   3. Is your CP continuous or coming and going? Comes and goes   4. Have you taken Nitroglycerin?  1 time   5. How long have you been experiencing CP?  Started 2 days ago - pain wanted to be seen- first available appointment at this time is 06-03-23- please call to evaluateb   6. If NO CP at time of call then end call with telling Pt to call back or call 911 if Chest pain returns prior to return call from triage team.

## 2023-06-04 ENCOUNTER — Ambulatory Visit: Payer: Medicaid Other | Attending: Physician Assistant | Admitting: Physician Assistant

## 2023-06-07 ENCOUNTER — Encounter: Payer: Self-pay | Admitting: Physician Assistant

## 2023-06-08 ENCOUNTER — Other Ambulatory Visit: Payer: Self-pay | Admitting: Internal Medicine

## 2023-07-30 ENCOUNTER — Ambulatory Visit: Payer: Medicaid Other | Admitting: Internal Medicine

## 2023-08-06 ENCOUNTER — Ambulatory Visit: Payer: Medicaid Other | Attending: Internal Medicine | Admitting: Internal Medicine

## 2023-08-06 VITALS — BP 130/80 | HR 76 | Ht 65.0 in | Wt 186.0 lb

## 2023-08-06 DIAGNOSIS — I2510 Atherosclerotic heart disease of native coronary artery without angina pectoris: Secondary | ICD-10-CM | POA: Diagnosis not present

## 2023-08-06 DIAGNOSIS — I10 Essential (primary) hypertension: Secondary | ICD-10-CM | POA: Diagnosis not present

## 2023-08-06 DIAGNOSIS — G14 Postpolio syndrome: Secondary | ICD-10-CM | POA: Diagnosis not present

## 2023-08-06 DIAGNOSIS — N529 Male erectile dysfunction, unspecified: Secondary | ICD-10-CM

## 2023-08-06 DIAGNOSIS — E785 Hyperlipidemia, unspecified: Secondary | ICD-10-CM

## 2023-08-06 DIAGNOSIS — R079 Chest pain, unspecified: Secondary | ICD-10-CM | POA: Diagnosis not present

## 2023-08-06 NOTE — Patient Instructions (Addendum)
 Medication Instructions:  NO CHANGES  *If you need a refill on your cardiac medications before your next appointment, please call your pharmacy*   Lab Work: Lipid Panel and LPa today   If you have labs (blood work) drawn today and your tests are completely normal, you will receive your results only by: MyChart Message (if you have MyChart) OR A paper copy in the mail If you have any lab test that is abnormal or we need to change your treatment, we will call you to review the results.   Testing/Procedures:    Please report to Radiology at the [facility] Main Entrance 30 minutes early for your test.  [address] [place], [place] [address]                         OR   Please report to Radiology at [facility] Main Entrance, medical mall, 30 mins prior to your test.  [address]  [place], [place]  How to Prepare for Your Cardiac PET/CT Stress Test:  Nothing to eat or drink, except water, 3 hours prior to arrival time.  NO caffeine/decaffeinated products, or chocolate 12 hours prior to arrival. (Please note decaffeinated beverages (teas/coffees) still contain caffeine).  If you have caffeine within 12 hours prior, the test will need to be rescheduled.  Medication instructions: Do not take erectile dysfunction medications for 72 hours prior to test (sildenafil, tadalafil) Do not take nitrates (isosorbide mononitrate, Ranexa) the day before or day of test Do not take tamsulosin the day before or morning of test Hold theophylline containing medications for 12 hours. Hold Dipyridamole 48 hours prior to the test.  Diabetic Preparation: If able to eat breakfast prior to 3 hour fasting, you may take all medications, including your insulin. Do not worry if you miss your breakfast dose of insulin - start at your next meal. If you do not eat prior to 3 hour fast-Hold all diabetes (oral and insulin) medications. Patients who wear a continuous  glucose monitor MUST remove the device prior to scanning.  You may take your remaining medications with water.  NO perfume, cologne or lotion on chest or abdomen area. FEMALES - Please avoid wearing dresses to this appointment.  Total time is 1 to 2 hours; you may want to bring reading material for the waiting time.  IF YOU THINK YOU MAY BE PREGNANT, OR ARE NURSING PLEASE INFORM THE TECHNOLOGIST.  In preparation for your appointment, medication and supplies will be purchased.  Appointment availability is limited, so if you need to cancel or reschedule, please call the Radiology Department Scheduler at [phone] 24 hours in advance to avoid a cancellation fee of $100.00  What to Expect When you Arrive:  Once you arrive and check in for your appointment, you will be taken to a preparation room within the Radiology Department.  A technologist or Nurse will obtain your medical history, verify that you are correctly prepped for the exam, and explain the procedure.  Afterwards, an IV will be started in your arm and electrodes will be placed on your skin for EKG monitoring during the stress portion of the exam. Then you will be escorted to the PET/CT scanner.  There, staff will get you positioned on the scanner and obtain a blood pressure and EKG.  During the exam, you will continue to be connected to the EKG and blood pressure machines.  A small, safe amount of a  radioactive tracer will be injected in your IV to obtain a series of pictures of your heart along with an injection of a stress agent.    After your Exam:  It is recommended that you eat a meal and drink a caffeinated beverage to counter act any effects of the stress agent.  Drink plenty of fluids for the remainder of the day and urinate frequently for the first couple of hours after the exam.  Your doctor will inform you of your test results within 7-10 business days.  For more information and frequently asked questions, please visit our  website: [email or web address]  For questions about your test or how to prepare for your test, please call: Cardiac Imaging Nurse Navigators Office: [phone]    Follow-Up: At [facility], you and your health needs are our priority.  As part of our continuing mission to provide you with exceptional heart care, we have created designated Provider Care Teams.  These Care Teams include your primary Cardiologist (physician) and Advanced Practice Providers (APPs -  Physician Assistants and Nurse Practitioners) who all work together to provide you with the care you need, when you need it.  We recommend signing up for the patient portal called "MyChart".  Sign up information is provided on this After Visit Summary.  MyChart is used to connect with patients for Virtual Visits (Telemedicine).  Patients are able to view lab/test results, encounter notes, upcoming appointments, etc.  Non-urgent messages can be sent to your provider as well.   To learn more about what you can do with MyChart, go to [email or web address].    Your next appointment:     2-3 months with [facility] -- after testing      You have been referred to Dr. Riley Kill with [facility] [address] Phone: [phone]     1st Floor: - Lobby - Registration  - Pharmacy  - Lab - Cafe  2nd Floor: - PV Lab - Diagnostic Testing (echo, CT, nuclear med)  3rd Floor: - Vacant  4th Floor: - TCTS (cardiothoracic surgery) - AFib Clinic - Structural Heart Clinic - Vascular Surgery  - Vascular Ultrasound  5th Floor: - HeartCare Cardiology (general and EP) - Clinical Pharmacy for coumadin, hypertension, lipid, weight-loss medications, and med management appointments    Valet parking services will be available as well.

## 2023-08-06 NOTE — Progress Notes (Signed)
 OFFICE NOTE  Chief Complaint:  Follow-up chest pain  Primary Care Physician: Pcp, No  HPI:  John Mcintyre is a pleasant 53 year old male [place] He says for almost all of his life he had polio and this affected his legs significantly causing weakness in the need to use crutches to walk. 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 Recently he's been having worsening pain in his upper chest shoulders and down both arms. Some the pain is in the back of his neck and even radiates to the right half of his face. While some of those symptoms certainly sound neurologic, he also has some associated fatigue and shortness of breath. He is a smoker and has a history of hypertension and dyslipidemia which is not been well controlled. He was recently started on lovastatin. Triglycerides were noted to be elevated greater than 700 and recently came down to the 300s. His father has a history of heart disease and sounds like he died from heart failure in his 8s. I had the pleasure seeing John Mcintyre back in the office today. He underwent a nuclear stress test which was interpreted as low risk. There was a very small distal anteroapical fixed defect which was suggestive of possible scar. EF was low normal at 52%. No reversible ischemia was seen. He reports no further significant symptoms. Subsequently he presented with worsening chest pain and was referred by Norma Fredrickson, NP for cardiac catheterization in 11/2015. 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 There was a CTO of the RCA with left to right collaterals and severe proximal LAD stenosis. He had successful PCI to the proximal LAD and was chest pain free thereafter.   04/20/2016   He was seen in the ER yesterday with recurrent chest pain for the past 2 days which is similar to his prior anginal symptoms. He reports compliance with his medications, but did not take it this morning. BP was 174/113 on  admission. He was seen in the office in 12/2015 and his lisinopril was increased to 10 mg daily. Initial troponin was negative. Labs indicate a mildly elevated WBC count at 11.3. CXR unremarkable. EKG shows NSR without ischemic changes. He ruled out for acute MI and I increased his lisinopril to 20 mg daily. He said he try to get to the pharmacy for that prescription, but was somewhat confused that I wanted him to take twice the dose that he currently takes. This morning he took 10 mg of lisinopril blood pressure was 152/90. He denies any further chest pain. He took Aleve over-the-counter and reports improvement in his neck pain and shoulder pain. He continues to have difficulty ambulating, particularly with worsening weakness of his legs secondary to polio.  10/15/2016  John Mcintyre returns for follow-up. He was seen in the ER last night and discharged early this morning for chest pain. He recently called our office about this. He describes pain all across his chest and upper back and into his arms. This pain is completely different than the pain he had prior to his cardiac stent placement. 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 Troponin was negative. He was discharged for follow-up with [name]. He feels that his pain may be related to using crutches. 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 He may have neuropathic or musculoskeletal pain related to that. He is continuing on aspirin and Plavix which is more than adequate treatment for his coronary disease. He also complained of visual changes today including difficulty reading. I suspect this is presbyopia however at times he feels that he has some numbness on one half of his face and other unusual symptoms. He does report very poor sleep at night as well. He denies any snoring or apnea but gets less than 6 hours of  sleep. He also tried Chantix for smoking cessation but said that it worsened his pain and he stopped taking it. He also felt that it made it more difficult for him to sleep.  01/22/2017  John Mcintyre returns for follow-up. He reports she's had no further chest pain. He continues to have problems with leg weakness secondary to history of polio. He has had significant muscle wasting particularly in the left hip and more difficulty walking. He is inquiring about bracing. I referred him to neurology however the wait was so long he did not take the appointment. He is more than 1 year out from stenting and I believe could be taken off of Plavix. Blood pressure is at goal today.  06/29/2017  John Mcintyre today in follow-up.  He reports some chest pain.  This is not similar to his pain with his recent stent implantation in July 2017.  He completed a year of Plavix and now is on aspirin.  Some of his symptoms seem to be worse with muscle use and he as required braces in order to support his upper body.  At his last office visit he inquired about additional bracing and I referred him to Dr. Hermelinda Medicus with physical medicine and rehabilitation.  Unfortunately he never made that appointment.  He is also been referred to Dr. Allena Katz with neurology twice but canceled the appointment since they were scheduled 3 months out.  He said he either missed the appointments or did not have enough patients for them.  That being said he is asking for a referral again today.  He also says he is having trouble with his vision and I advised him to contact an ophthalmologist.  01/28/2018  John Mcintyre recently has been experiencing some chest pain.  He presented to the emergency department yesterday and had point tenderness.  He was able to point to it was in the area around the left nipple.  It is distinctly different than the pain he had previously with his prior stent which included pain across his chest and shortness of breath with  exertion.  He has had no more symptoms like that.  He took nitroglycerin without any real improvement.  He was given aspirin in the ER.  EKG was negative for ischemia.  Troponin was negative x2.  Other studies were negative and low risk for pulmonary embolus.  He has worked with rehab/physiatry for bracing, unfortunately was not able to afford any good options.  He reports he is considering edentia lesion which was recommended due to poor dentition and multiple caries.  10/19/2019  John Mcintyre returns today for follow-up of a Myoview stress test.  He was recently seen by Cadence Fransico Michael, PA-C, who wished to evaluate him further for chest pain.  He has a history of coronary artery disease and was found to have significant proximal LAD disease which was stented as well as an occluded distal right coronary with left to right collaterals.  In 2019 he had an abnormal Myoview  stress test which showed distal anterior and apical fixed perfusion defect suggestive of scar.  EF was in the low 40% range.  He is repeat Myoview in April of this year showed a very similar finding with improvement in LVEF however to the upper 40s, however the fixed defect persisted.  There was no evidence of reversible ischemia.  He is nitrate was increased to 60 mg daily.  In addition he had repeat labs which showed his LDL cholesterol was still well above target of less than 70 and therefore his lovastatin was also increased from 20 to 40 mg.  He is not clear if he is actually taking that dose.  His other complaints today included chronic headache for which she takes ibuprofen 800 mg at least twice a day.  I suspect he may have a rebound headache.  In addition he has been having chronic shoulder pain and feels like he could benefit from physical therapy.  He also continues to report issues with erectile dysfunction however cannot take PDE-5 inhibitors in addition to his nitrates.  07/25/2021  John Mcintyre is seen today in follow-up.  He was  just in the ER [facility] with atypical type chest burning.  Troponins were negative.  He has had some continued fatigue.  He reports he wants to stop smoking.  He still smokes about 2 packs/day.  We discussed possible options and I think the best option might be bupropion.  He was interested in medications for erectile dysfunction but was we have discussed in the past he cannot take PDE 5 inhibitors because he is on long-acting nitrates.  He is inquiring about extended lab work today because he is not currently seeing a primary care provider.  He was being seen at [facility] however he was dismissed from the practice due to no-shows.  Will refer him to community health and wellness to see if that may be another option for him since he has Medicaid.  He is requesting crutches today.  08/06/2023  John Mcintyre is seen today for follow-up.  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  We had previously tried to refer him for bracing and he is had some work done with his primary care provider but really needs further assistance with that.  He reports he used to drink alcohol on a daily basis but quit a number of months ago.  He has a history of high triglycerides as well.  He saw Marjie Skiff, PA-C about a year ago.  She increases lisinopril and changed him to atorvastatin 40 mg daily.  I do not see that he has had any repeat lipids since then.  He is complaining of some burning chest discomfort.  He has had this in the past.  He is also complaining of some sharp chest pain that he can point at which is worse and goes down his arm I think again related to the braces/crutches that he has to use to walk.  Some of the symptoms sound anginal.  He does have a coronary history.  He had a catheterization in 2017 and intervention at the time.  He has had a couple stress test since then the last in 2021 which was negative.  Blood pressure was a little elevated today at  130/80.  He said he did not take his medicine this morning.  He continues to smoke but is cut back from 2 to 1 pack a day.  He also  continues to have issues with erectile dysfunction.   PMHx:  Past Medical History:  Diagnosis Date   Coronary artery disease    Hypercholesteremia    Hypertension    Myocardial infarction [facility])    Polio     Past Surgical History:  Procedure Laterality Date   CARDIAC CATHETERIZATION N/A 12/23/2015   Procedure: Left Heart Cath and Coronary Angiography;  Surgeon: Kathleene Hazel, MD;  Location: [facility] INVASIVE CV LAB;  Service: Cardiovascular;  Laterality: N/A;   CARDIAC CATHETERIZATION N/A 12/23/2015   Procedure: Coronary Stent Intervention;  Surgeon: Kathleene Hazel, MD;  Location: [facility] INVASIVE CV LAB;  Service: Cardiovascular;  Laterality: N/A;   CORONARY STENT PLACEMENT  12/23/2015   Successful PTCA/DES x 1 proximal LAD   HIP SURGERY Left 1987   for polio, not sure what was done   KNEE SURGERY Left 1987   for polio   TOOTH EXTRACTION N/A 11/25/2020   Procedure: DENTAL RESTORATION/EXTRACTIONS;  Surgeon: [name], DMD;  Location: [facility] OR;  Service: Oral Surgery;  Laterality: N/A;    FAMHx:  Family History  Problem Relation Age of Onset   Hypertension Mother    Heart failure Father     SOCHx:   reports that he has been smoking cigarettes. He has never used smokeless tobacco. He reports current alcohol use of about 6.0 standard drinks of alcohol per week. He reports that he does not use drugs.  ALLERGIES:  Allergies  Allergen Reactions   Poractant Alfa Other (See Comments)    Patient does not eat pork because of his culture   Pork-Derived Products Other (See Comments)    Patient does not eat pork because of his culture   Tape Other (See Comments)    Skin is sensitive    ROS: Pertinent items noted in HPI and remainder of comprehensive ROS otherwise negative.  HOME MEDS: Current Outpatient Medications  Medication Sig Dispense  Refill   amoxicillin (AMOXIL) 500 MG capsule Take 1 capsule (500 mg total) by mouth 3 (three) times daily. 21 capsule 0   aspirin EC 81 MG tablet Take 81 mg by mouth daily.     buPROPion (WELLBUTRIN SR) 150 MG 12 hr tablet TAKE 1 TABLET BY MOUTH TWICE A DAY FOR 12 WEEKS FOR SMOKING CESSATION 180 tablet 1   fenofibrate (TRICOR) 145 MG tablet TAKE 1 TABLET BY MOUTH EVERY DAY 90 tablet 3   ibuprofen (ADVIL) 800 MG tablet Take 400 mg by mouth every 6 (six) hours as needed for moderate pain.     isosorbide mononitrate (IMDUR) 60 MG 24 hr tablet TAKE 1 TABLET BY MOUTH EVERY DAY 90 tablet 3   metoprolol tartrate (LOPRESSOR) 50 MG tablet TAKE 1 TABLET BY MOUTH EVERY MORNING AND 1/2 TABLET EVERY EVENING 135 tablet 1   nitroGLYCERIN (NITROSTAT) 0.4 MG SL tablet DISSOLVE 1 TABLET UNDER TONGUE EVERY 5 MINUTES AS NEEDED FOR CHEST PAIN *MAX 3 TABLETS* 25 tablet 6   polyethylene glycol (MIRALAX) 17 g packet Take 17 g by mouth daily. 14 each 0   amLODipine (NORVASC) 5 MG tablet TAKE 1 TABLET (5 MG TOTAL) BY MOUTH DAILY. 90 tablet 3   atorvastatin (LIPITOR) 40 MG tablet Take 1 tablet (40 mg total) by mouth daily. 90 tablet 3   hydrochlorothiazide (HYDRODIURIL) 12.5 MG tablet Take 1 tablet by mouth daily. (Patient not taking: Reported on 08/06/2023)     lisinopril (ZESTRIL) 40 MG tablet Take 1 tablet (40 mg total) by mouth daily. 90 tablet 3  oxyCODONE-acetaminophen (PERCOCET) 5-325 MG tablet Take 1 tablet by mouth every 4 (four) hours as needed. (Patient not taking: Reported on 08/06/2023) 30 tablet 0   No current facility-administered medications for this visit.    LABS/IMAGING: No results found for this or any previous visit (from the past 48 hours). No results found.   WEIGHTS: Wt Readings from Last 3 Encounters:  08/06/23 186 lb (84.4 kg)  12/02/22 185 lb 3.2 oz (84 kg)  07/25/21 169 lb 6.4 oz (76.8 kg)    VITALS: BP 130/80 (BP Location: Right Arm, Patient Position: Sitting, Cuff Size: Normal)    Pulse 76   Ht 5\' 5"  (1.651 m)   Wt 186 lb (84.4 kg)   SpO2 94%   BMI 30.95 kg/m   EXAM: General appearance: alert and no distress Neck: no carotid bruit and no JVD Lungs: clear to auscultation bilaterally Heart: regular rate and rhythm Abdomen: soft, non-tender; bowel sounds normal; no masses,  no organomegaly Extremities: Peripheral muscle wasting secondary to polio Pulses: 2+ and symmetric Skin: Skin color, texture, turgor normal. No rashes or lesions Neurologic: Grossly normal Psych: Pleasant  EKG: EKG Interpretation Date/Time:  Friday August 06 2023 08:28:37 EST Ventricular Rate:  76 PR Interval:  182 QRS Duration:  78 QT Interval:  390 QTC Calculation: 438 R Axis:   43  Text Interpretation: Normal sinus rhythm Septal infarct (cited on or before 06-Aug-2023) When compared with ECG of 02-Dec-2022 17:00, Questionable change in initial forces of Anterior leads Nonspecific T wave abnormality now evident in Lateral leads Confirmed by Zoila Shutter [phone]) on 08/06/2023 8:40:22 AM    ASSESSMENT: Chest pain- atypical, sounds neuropathic in nature- recent PCI to the LAD (11/2015) and noted CTO of the RCA with left to right collaterals -> Myoview in 2021 was negative for ischemia and showed a fixed defect of the mid anterior and apical anterior segments consistent with prior infarct. Hypertension Dyslipidemia Tobacco abuse Family history of coronary disease Polio (with possible post-polio syndrome) Suspect NSAID rebound headache Erectile dysfunction  PLAN: 1.   John Mcintyre continues to have issues with chest discomfort.  Some of which I think is musculoskeletal.  I think he would benefit from seeing physiatry for their assistance in bracing and/or options.  He was interested in some type of carbon fiber bracing.  He is also complaining of some burning chest discomfort which does not sound musculoskeletal.  He has had this in the past.  He has known coronary disease with an infarct  on the anterior wall.  No evidence of reversibility by last stress test in 2021.  Will reassess this now with a cardiac PET.  Blood pressure is top normal today but he did not take his medicine.  After changing his statin and stopping alcohol use his cholesterols not been reassessed therefore we will obtain that today since he is fasting.  He also discussed ED medications.  I am hesitant to use that until after his stress test to see if he is not ischemic.  Follow-up with APP in 6 months or sooner as necessary.  Chrystie Nose, MD, [facility], FACP  [facility]  Medical Director of the Advanced Lipid Disorders &  Cardiovascular Risk Reduction Clinic Diplomate of the American Board of Clinical Lipidology Attending Cardiologist  Direct Dial: [phone]  Fax: [phone]  Website:  [email or web address] Nicely Lakima Dona 08/06/2023, 8:40 AM

## 2023-08-07 ENCOUNTER — Other Ambulatory Visit: Payer: Self-pay | Admitting: Internal Medicine

## 2023-08-08 LAB — LIPID PANEL
Chol/HDL Ratio: 5.2 ratio — ABNORMAL HIGH (ref 0.0–5.0)
Cholesterol, Total: 152 mg/dL (ref 100–199)
HDL: 29 mg/dL — ABNORMAL LOW (ref >39)
LDL Chol Calc (NIH): 89 mg/dL (ref 0–99)
Triglycerides: 196 mg/dL — ABNORMAL HIGH (ref 0–149)
VLDL Cholesterol Cal: 34 mg/dL (ref 5–40)

## 2023-08-08 LAB — LIPOPROTEIN A (LPA): Lipoprotein (a): 99.3 nmol/L — ABNORMAL HIGH (ref <75.0)

## 2023-08-10 ENCOUNTER — Encounter: Payer: Self-pay | Admitting: Internal Medicine

## 2023-08-25 ENCOUNTER — Encounter: Payer: Self-pay | Admitting: Physical Medicine & Rehabilitation

## 2023-09-29 ENCOUNTER — Encounter: Attending: Physical Medicine & Rehabilitation | Admitting: Physical Medicine & Rehabilitation

## 2023-09-29 ENCOUNTER — Encounter: Payer: Self-pay | Admitting: Physical Medicine & Rehabilitation

## 2023-09-29 DIAGNOSIS — G14 Postpolio syndrome: Secondary | ICD-10-CM | POA: Insufficient documentation

## 2023-09-29 DIAGNOSIS — Z720 Tobacco use: Secondary | ICD-10-CM | POA: Insufficient documentation

## 2023-09-29 NOTE — Progress Notes (Signed)
 Subjective:    Patient ID: John Mcintyre, male    DOB: [birth year], 53 y.o.   MRN: [id number]  HPI  This is an initial evlaution for Mr. John Mcintyre. 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 He grew up in [place]. His left leg has always been weaker although he has weakness in the right leg as well. As he has gotten older and heavier, it's been more difficult for him to move around. He first wore a leg brace when he was 3 and then another 53 years old. The last brace he had build for his left leg was in 2012. He doesn't like it because it's very heavy. He had leg lengthening surgery at some point but it wasn't very effective. He has lived in [place] since 2009  Currently, he walks with forearm crutches and his right leg. He uses his upper body quite a bit. It takes quite a bit of energy to walk and he tires quickly. His back, hips,  and upper body can become quite tender. He's had chest pain at times which he thought recently to be cardiac, but it turned out to be musculoskeletal in nature from how he uses his crutches and upper body.   He has a left leg brace which appears to have been made by [employer] back in 2012? He uses a motorized scooter when he goes to the store if one's available. If there is not, he doesn't go into store!.   For pain he takes ibuprofen  400mg  for pain maybe once per week. He uses only when "he needs it". Sometimes he uses acetaminophen . He doesn't heat or ice.   He denies general fatigue or any problems with memory/concentration. He has gained some weight due to his age and the fact that he cannot get around as much as he used to.   Pain Inventory Average Pain 5 Pain Right Now 5 My pain is constant  In the last 24 hours, has pain interfered with the following? General activity 5 Relation with others 5 Enjoyment of life 5 What TIME of day is your pain at its worst? evening Sleep (in general) Good  Pain is worse with: walking Pain  improves with: rest Relief from Meds: 7  use a cane  employed # of hrs/week    weakness trouble walking     Cardiology     Family History  Problem Relation Age of Onset   Hypertension Mother    Heart failure Father    Social History   Socioeconomic History   Marital status: Married    Spouse name: Not on file   Number of children: 3   Years of education: 2 years of college   Highest education level: Not on file  Occupational History   Occupation: Media planner  Tobacco Use   Smoking status: Every Day    Current packs/day: 2.00    Types: Cigarettes   Smokeless tobacco: Never   Tobacco comments:    ultra lights    Hemp cigarettes 2 days ago  Vaping Use   Vaping status: Never Used  Substance and Sexual Activity   Alcohol use: Yes    Alcohol/week: 6.0 standard drinks of alcohol    Types: 6 Cans of beer per week    Comment: occasionally   Drug use: No   Sexual activity: Not on file  Other Topics Concern   Not on file  Social History Narrative  Lives at home with wife and three children.   Right-handed.   5 cups tea per day.   Social Drivers of Corporate investment banker Strain: Not on file  Food Insecurity: Low Risk  (04/21/2023)   Received from [facility]   Hunger Vital Sign    Worried About Running Out of Food in the Last Year: Never true    Ran Out of Food in the Last Year: Never true  Transportation Needs: No Transportation Needs (04/21/2023)   Received from [facility]    In the past 12 months, has lack of reliable transportation kept you from medical appointments, meetings, work or from getting things needed for daily living? : No  Physical Activity: Inactive (04/21/2023)   Received from [facility]   Exercise Vital Sign    Days of Exercise per Week: 0 days    Minutes of Exercise per Session: 0 min  Stress: Not on file  Social Connections: Not on file   Past Surgical History:  Procedure Laterality Date   CARDIAC  CATHETERIZATION N/A 12/23/2015   Procedure: Left Heart Cath and Coronary Angiography;  Surgeon: Odie Benne, MD;  Location: [facility] INVASIVE CV LAB;  Service: Cardiovascular;  Laterality: N/A;   CARDIAC CATHETERIZATION N/A 12/23/2015   Procedure: Coronary Stent Intervention;  Surgeon: Odie Benne, MD;  Location: [facility] INVASIVE CV LAB;  Service: Cardiovascular;  Laterality: N/A;   CORONARY STENT PLACEMENT  12/23/2015   Successful PTCA/DES x 1 proximal LAD   HIP SURGERY Left 1987   for polio, not sure what was done   KNEE SURGERY Left 1987   for polio   TOOTH EXTRACTION N/A 11/25/2020   Procedure: DENTAL RESTORATION/EXTRACTIONS;  Surgeon: Ascencion Lava, DMD;  Location: [facility] OR;  Service: Oral Surgery;  Laterality: N/A;   Past Medical History:  Diagnosis Date   Coronary artery disease    Hypercholesteremia    Hypertension    Myocardial infarction (HCC)    Polio    There were no vitals taken for this visit.  Opioid Risk Score:   Fall Risk Score:  `1  Depression screen PHQ 2/9      No data to display           Review of Systems     Objective:   Physical Exam  Gen: no distress, normal appearing HEENT: oral mucosa pink and moist, NCAT Cardio: Reg rate Chest: normal effort, normal rate of breathing Abd: soft, non-distended Ext: no edema Psych: pleasant, normal affect Skin: intact Neuro: Alert and oriented x 3. Normal insight and awareness. Intact Memory. Normal language and speech. Does seem to have short attention span. Cranial nerve exam unremarkable. MMT: BUE 4+ to 5/5. RLE: HF 2, KE 2, KF 2+, ADF/PF 4/5. LLE 0-tr throughout except for ADF/PF which is 1/5. DTR's 1+. Sensory exam normal for light touch and pain in all 4 limbs. No limb ataxia or cerebellar signs. No abnormal tone appreciated.  .   Musculoskeletal: left leg atrophic and approximately 3" shorter than the right. Pt ambulates using loftstrand crutches and his right leg for support.         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 Pt suffered from polio as a 65 month old child.  Tobacco use Hx of CAD  Plan: We talked about the cause of post-polio and the course of the syndrome as well as  potential management considerations. There is no cure for post-polio syndrome.  He needs a new KAFO. Will send to [facility] for assessment and construction of braces, perhaps new crutches. Unfortunately he did not bring his old brace with him today.  Discussed maintaining aerobic exercise up to the point of fatigue. Otherwise conserve energy during daily activities. He may need a motorized chair at some point.  Encouraged appropriate sleep, 8-10 hours per night Tobacco cessation was discussed.  PRN acetaminophen  and ibuprofen  for pain in his low back and legs.  Will send to PT for gait training/exercise education once he has his new brace.  7.  A hand out was provided on the basics of post polio  Over 60 minutes of face to face patient care time were spent during this visit. All questions were encouraged and answered. Follow up with me in 2 months.

## 2023-09-29 NOTE — Patient Instructions (Signed)
 ALWAYS FEEL FREE TO CALL OUR OFFICE WITH ANY PROBLEMS OR QUESTIONS [phone])  **PLEASE NOTE** ALL MEDICATION REFILL REQUESTS (INCLUDING CONTROLLED SUBSTANCES) NEED TO BE MADE AT LEAST 7 DAYS PRIOR TO REFILL BEING DUE. ANY REFILL REQUESTS INSIDE THAT TIME FRAME MAY RESULT IN DELAYS IN RECEIVING YOUR PRESCRIPTION.

## 2023-10-13 ENCOUNTER — Ambulatory Visit: Admitting: Student

## 2023-10-19 ENCOUNTER — Other Ambulatory Visit: Payer: Self-pay | Admitting: Internal Medicine

## 2023-10-19 DIAGNOSIS — R079 Chest pain, unspecified: Secondary | ICD-10-CM

## 2023-10-19 DIAGNOSIS — I2510 Atherosclerotic heart disease of native coronary artery without angina pectoris: Secondary | ICD-10-CM

## 2023-10-20 ENCOUNTER — Ambulatory Visit (HOSPITAL_COMMUNITY)

## 2023-10-22 NOTE — Progress Notes (Deleted)
  Cardiology Office Note:  .   Date:  10/22/2023  ID:  John Mcintyre, DOB 16-Jun-1970, MRN [id number] PCP: Juvenal Opoka  [facility] Providers Cardiologist:  Hazle Lites, MD { Click to update primary MD,subspecialty MD or APP then REFRESH:1}   History of Present Illness: .   John Mcintyre is a 53 y.o. male with history of HTN, HLD, tobacco abuse, polio, chest pain, family history of CAD, CAD-cardiac catheterization in 11/2015. 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 There was a CTO of the RCA with left to right collaterals and severe proximal LAD stenosis. He had successful PCI to the proximal LAD and was chest pain free thereafter. On going atypical chest pain felt due to polio and walking with is crutches. PET scan ordered 07/2023 and not done but NST:  ROS: ***  Studies Reviewed: Aaron Aas         Prior CV Studies: {Select studies to display:26339}  NST 09/2023   Risk Assessment/Calculations:   {Does this patient have ATRIAL FIBRILLATION?:[phone]} No BP recorded.  {Refresh Note OR Click here to enter BP  :1}***       Physical Exam:   VS:  There were no vitals taken for this visit.   Wt Readings from Last 3 Encounters:  08/06/23 186 lb (84.4 kg)  12/02/22 185 lb 3.2 oz (84 kg)  07/25/21 169 lb 6.4 oz (76.8 kg)    GEN: Well nourished, well developed in no acute distress NECK: No JVD; No carotid bruits CARDIAC: ***RRR, no murmurs, rubs, gallops RESPIRATORY:  Clear to auscultation without rales, wheezing or rhonchi  ABDOMEN: Soft, non-tender, non-distended EXTREMITIES:  No edema; No deformity   ASSESSMENT AND PLAN: .    CAD DES LAD 2017 with CTO RCA, recurrent chest pain NST 2021 negative for ischemia, fixed defect, NST 5/ 2025:  HTN  HLD  Tobacco abuse  Family history of CAD  Polio     {Are you ordering a CV Procedure (e.g. stress test, cath, DCCV, TEE, etc)?   Press F2        :[id number]}  Dispo: ***  Signed, Theotis Flake, PA-C

## 2023-10-26 ENCOUNTER — Other Ambulatory Visit: Payer: Self-pay | Admitting: Internal Medicine

## 2023-10-26 ENCOUNTER — Telehealth (HOSPITAL_COMMUNITY): Payer: Self-pay

## 2023-10-26 DIAGNOSIS — R079 Chest pain, unspecified: Secondary | ICD-10-CM

## 2023-10-26 DIAGNOSIS — I2510 Atherosclerotic heart disease of native coronary artery without angina pectoris: Secondary | ICD-10-CM

## 2023-10-26 NOTE — Telephone Encounter (Signed)
 Attempted to contact the patient, but no answer. John Mcintyre CCT

## 2023-10-27 ENCOUNTER — Ambulatory Visit (HOSPITAL_COMMUNITY)
Admission: RE | Admit: 2023-10-27 | Source: Ambulatory Visit | Attending: Internal Medicine | Admitting: Internal Medicine

## 2023-11-01 ENCOUNTER — Encounter (HOSPITAL_COMMUNITY): Payer: Self-pay | Admitting: Internal Medicine

## 2023-11-02 ENCOUNTER — Ambulatory Visit: Admitting: Physician Assistant

## 2023-11-02 ENCOUNTER — Ambulatory Visit: Admitting: Student

## 2023-11-02 ENCOUNTER — Other Ambulatory Visit: Payer: Self-pay | Admitting: Internal Medicine

## 2023-11-02 DIAGNOSIS — I2510 Atherosclerotic heart disease of native coronary artery without angina pectoris: Secondary | ICD-10-CM

## 2023-11-05 ENCOUNTER — Encounter (HOSPITAL_COMMUNITY): Payer: Self-pay | Admitting: *Deleted

## 2023-11-05 ENCOUNTER — Telehealth (HOSPITAL_COMMUNITY): Payer: Self-pay | Admitting: *Deleted

## 2023-11-05 NOTE — Telephone Encounter (Signed)
 Reminder and instructions sent via [employer] for upcoming stress test on 11/15/23 at 10:30

## 2023-11-13 NOTE — Progress Notes (Deleted)
 Cardiology Office Note:    Date:  11/13/2023   ID:  Tori Freer, DOB 1971-02-22, MRN [id number]  PCP:  Pcp, No  Cardiologist:  Hazle Lites, MD { Click to update primary MD,subspecialty MD or APP then REFRESH:1}    Referring MD: No ref. provider found   Chief Complaint: follow-up of chest pain  History of Present Illness:    John Mcintyre is a 53 y.o. male with a history of CAD with DES to LAD and CTO of RCA on cardiac catheterization in 2017, mild LV dysfunction with no signs or symptoms of CHF, hypertension, hyperlipidemia, polio, tobacco abuse, and alcohol abuse who is followed by Dr. Maximo Spar and presents today for follow-up of chest pain.   Patient has been followed by Dr. Maximo Spar since 2016. He has had intermittent chest pain over the years and has had multiple stress tests in the past. Cardiac catheterization in 11/2015 which showed 95% stenosis of proximal LAD, CTO of proximal RCA with collaterals, and 40% stenosis of LCX. LVEF was mildly reduced at 45-50%.  He underwent successful PCI with DES to LAD. Myoview  in 09/2019 which showed a fixed defect in the mid anterior and apical anterior segment consistent with prior infarction but no ischemia. EF 47% at that time. There was no significant changes from prior Myoview  in 2019. Continue medical therapy was recommended and his Imdur  was increased.  He was last seen by Dr. Maximo Spar in 07/2023 at which time he continued to struggle with musculoskeletal pain that was felt to be due to postpolio syndrome. He also report some burning in his chest as well as some occasional sharp chest pain. The sharp chest pain was felt to be musculoskeletal but stress test was ordered for further evaluation of burning pain. Myoview  was scheduled for 11/15/2014 but patient no-showed this appointment.  Patient presents today for follow-up. ***  CAD Patient has a history of intermittent chest pain over the years. LHC in 2017 showed 95% stenosis of proximal LAD and  CTO of RCA with collaterals. He underwent PCI with DES to LAD at that time. Myoview  was ordered at last visit but he no-showed this appointment.  - *** - Continue Imdur  60mg  daily and Lopressor  50mg  in the morning and 25mg  in the evening. - Continue aspirin , beta-blocker, statin.   Mild LV Dysfunction Noted on LHC in 2017 with LVEF of 45-50% by visual estimate. LVEDP was normal at that time. EF was *** on recent Myoview  earlier this week. It does not look like he has ever had an Echo. - Continue Lopressor  50mg  in the morning and 25mg  in the evening. - Continue Lisinopril  40mg  daily. *** - Will hold off on Echo for now given no signs or symptoms of CHF. ***   Hypertension BP *** - Continue current medications: Amlodipine  5mg  daily, Lisinopril  40mg  daily, Imdur  60mg  daily, and Lopressor  50mg  in the morning and 25mg  in the evening.    Hyperlipidemia Lipid panel in  07/2023: Total Cholesterol 152, Triglycerides 196, HDL 29, LDL 89. Lipoprotein (a) 99.3. LDL goal <70.  - Continue Lipitor 40mg  daily.  - Continue Fenofibrate  145mg  daily.  - Dr. Carola Chu was okay with most recent labs so will continue current reigmen.    Tobacco Abuse *** Patient unfortunately continues to smoke 2 packers per day.  - Discussed the importance of complete cessation. He does not seem ready to quit at this time.   Alcohol Abuse *** Patient also endorses significant alcohol use and reports drinking 1/2  bottle of whiskey almost daily. LFTs normal in 04/2023.  - Discussed importance of cutting back on alcohol consumption and drinking in moderation.   EKGs/Labs/Other Studies Reviewed:    The following studies were reviewed:  Left Cardiac Catheterization 12/23/2015: Prox RCA lesion, 100 %stenosed. Prox Cx lesion, 40 %stenosed. Dist Cx lesion, 40 %stenosed. There is mild left ventricular systolic dysfunction. LV end diastolic pressure is normal. The left ventricular ejection fraction is 45-50% by visual  estimate. There is no mitral valve regurgitation. A drug eluting stent was successfully placed. Prox LAD lesion, 95 %stenosed. Post intervention, there is a 0% residual stenosis.   1. Double vessel CAD with unstable angina.  2. Chronic occlusion of the proximal RCA with filling of the mid and distal vessel from left to right collaterals.  3. Severe stenosis proximal LAD 4. Moderate proximal Circumflex stenosis 5. Successful PTCA/DES x 1 proximal LAD 6. Mild LV systolic dysfunction   Recommendations: Will continue ASA, Plavix  for one year. Continue beta blocker and statin. Would consider changing to high dose Lipitor at discharge. Smoking cessation.  _______________  Myoview  11/15/2023:  ***  EKG:  EKG not ordered today.   Recent Labs: No results found for requested labs within last 365 days.  Recent Lipid Panel    Component Value Date/Time   CHOL 152 08/06/2023 0940   TRIG 196 (H) 08/06/2023 0940   HDL 29 (L) 08/06/2023 0940   CHOLHDL 5.2 (H) 08/06/2023 0940   CHOLHDL 6.3 (H) 12/18/2015 1016   VLDL 60 (H) 12/18/2015 1016   LDLCALC 89 08/06/2023 0940    Physical Exam:    Vital Signs: There were no vitals taken for this visit.    Wt Readings from Last 3 Encounters:  08/06/23 186 lb (84.4 kg)  12/02/22 185 lb 3.2 oz (84 kg)  07/25/21 169 lb 6.4 oz (76.8 kg)     General: 53 y.o. male in no acute distress. HEENT: Normocephalic and atraumatic. Sclera clear.  Neck: Supple. No carotid bruits. No JVD. Heart: *** RRR. Distinct S1 and S2. No murmurs, gallops, or rubs.  Lungs: No increased work of breathing. Clear to ausculation bilaterally. No wheezes, rhonchi, or rales.  Abdomen: Soft, non-distended, and non-tender to palpation.  Extremities: No lower extremity edema.  Radial and distal pedal pulses 2+ and equal bilaterally. Skin: Warm and dry. Neuro: No focal deficits. Psych: Normal affect. Responds appropriately.   Assessment:    No diagnosis found.  Plan:      Disposition: Follow up in ***   Signed, Casimer Clear, PA-C  11/13/2023 5:39 PM    [facility]

## 2023-11-15 ENCOUNTER — Ambulatory Visit (HOSPITAL_COMMUNITY)
Admission: RE | Admit: 2023-11-15 | Source: Ambulatory Visit | Attending: Internal Medicine | Admitting: Internal Medicine

## 2023-11-16 ENCOUNTER — Encounter: Payer: Self-pay | Admitting: Student

## 2023-11-17 ENCOUNTER — Ambulatory Visit: Admitting: Student

## 2023-11-21 NOTE — Progress Notes (Deleted)
 Cardiology Office Note:    Date:  11/21/2023   ID:  John Mcintyre, DOB Jun 10, 1970, MRN [id number]  PCP:  Pcp, No  Cardiologist:  Vinie JAYSON Maxcy, MD { Click to update primary MD,subspecialty MD or APP then REFRESH:1}    Referring MD: No ref. provider found   Chief Complaint: follow-up of chest pain  History of Present Illness:    John Mcintyre is a 53 y.o. male with a history of CAD with DES to LAD and CTO of RCA on cardiac catheterization in 2017, mild LV dysfunction with no signs or symptoms of CHF, hypertension, hyperlipidemia, polio, tobacco abuse, and alcohol abuse who is followed by Dr. Maxcy and presents today for follow-up of chest pain.   Patient has been followed by Dr. Maxcy since 2016. He has had intermittent chest pain over the years and has had multiple stress tests in the past. Cardiac catheterization in 11/2015 which showed 95% stenosis of proximal LAD, CTO of proximal RCA with collaterals, and 40% stenosis of LCX. LVEF was mildly reduced at 45-50%.  He underwent successful PCI with DES to LAD. Myoview  in 09/2019 which showed a fixed defect in the mid anterior and apical anterior segment consistent with prior infarction but no ischemia. EF 47% at that time. There was no significant changes from prior Myoview  in 2019. Continue medical therapy was recommended and his Imdur  was increased.  He was last seen by Dr. Maxcy in 07/2023 at which time he continued to struggle with musculoskeletal pain that was felt to be due to postpolio syndrome. He also report some burning in his chest as well as some occasional sharp chest pain. The sharp chest pain was felt to be musculoskeletal but stress test was ordered for further evaluation of burning pain. Myoview  on 11/23/2023 showed ***  Patient presents today for follow-up. ***  CAD Patient has a history of intermittent chest pain over the years. LHC in 2017 showed 95% stenosis of proximal LAD and CTO of RCA with collaterals. He underwent PCI  with DES to LAD at that time. Recent Myoview  on 11/23/2023 showed *** - *** - Continue Imdur  60mg  daily and Lopressor  50mg  in the morning and 25mg  in the evening. - Continue aspirin , beta-blocker, statin.   Mild LV Dysfunction Noted on LHC in 2017 with LVEF of 45-50% by visual estimate. LVEDP was normal at that time. EF was *** on recent Myoview  earlier this week. It does not look like he has ever had an Echo. - Continue Lopressor  50mg  in the morning and 25mg  in the evening. - Continue Lisinopril  40mg  daily. *** - Will hold off on Echo for now given no signs or symptoms of CHF. ***   Hypertension BP *** - Continue current medications: Amlodipine  5mg  daily, Lisinopril  40mg  daily, Imdur  60mg  daily, and Lopressor  50mg  in the morning and 25mg  in the evening.    Hyperlipidemia Lipid panel in  07/2023: Total Cholesterol 152, Triglycerides 196, HDL 29, LDL 89. Lipoprotein (a) 99.3. LDL goal <70.  - Continue Lipitor 40mg  daily.  - Continue Fenofibrate  145mg  daily.  - Dr. Cindi was okay with most recent labs so will continue current reigmen.    Tobacco Abuse *** Patient unfortunately continues to smoke 2 packers per day.  - Discussed the importance of complete cessation. He does not seem ready to quit at this time.   Alcohol Abuse *** Patient also endorses significant alcohol use and reports drinking 1/2 bottle of whiskey almost daily. LFTs normal in 04/2023.  -  Discussed importance of cutting back on alcohol consumption and drinking in moderation.   EKGs/Labs/Other Studies Reviewed:    The following studies were reviewed:  Left Cardiac Catheterization 12/23/2015: Prox RCA lesion, 100 %stenosed. Prox Cx lesion, 40 %stenosed. Dist Cx lesion, 40 %stenosed. There is mild left ventricular systolic dysfunction. LV end diastolic pressure is normal. The left ventricular ejection fraction is 45-50% by visual estimate. There is no mitral valve regurgitation. A drug eluting stent was successfully  placed. Prox LAD lesion, 95 %stenosed. Post intervention, there is a 0% residual stenosis.   1. Double vessel CAD with unstable angina.  2. Chronic occlusion of the proximal RCA with filling of the mid and distal vessel from left to right collaterals.  3. Severe stenosis proximal LAD 4. Moderate proximal Circumflex stenosis 5. Successful PTCA/DES x 1 proximal LAD 6. Mild LV systolic dysfunction   Recommendations: Will continue ASA, Plavix  for one year. Continue beta blocker and statin. Would consider changing to high dose Lipitor at discharge. Smoking cessation.  _______________  Myoview  11/23/2023:  ***  EKG:  EKG not ordered today.   Recent Labs: No results found for requested labs within last 365 days.  Recent Lipid Panel    Component Value Date/Time   CHOL 152 08/06/2023 0940   TRIG 196 (H) 08/06/2023 0940   HDL 29 (L) 08/06/2023 0940   CHOLHDL 5.2 (H) 08/06/2023 0940   CHOLHDL 6.3 (H) 12/18/2015 1016   VLDL 60 (H) 12/18/2015 1016   LDLCALC 89 08/06/2023 0940    Physical Exam:    Vital Signs: There were no vitals taken for this visit.    Wt Readings from Last 3 Encounters:  08/06/23 186 lb (84.4 kg)  12/02/22 185 lb 3.2 oz (84 kg)  07/25/21 169 lb 6.4 oz (76.8 kg)     General: 53 y.o. male in no acute distress. HEENT: Normocephalic and atraumatic. Sclera clear.  Neck: Supple. No carotid bruits. No JVD. Heart: *** RRR. Distinct S1 and S2. No murmurs, gallops, or rubs.  Lungs: No increased work of breathing. Clear to ausculation bilaterally. No wheezes, rhonchi, or rales.  Abdomen: Soft, non-distended, and non-tender to palpation.  Extremities: No lower extremity edema.  Radial and distal pedal pulses 2+ and equal bilaterally. Skin: Warm and dry. Neuro: No focal deficits. Psych: Normal affect. Responds appropriately.   Assessment:    No diagnosis found.  Plan:     Disposition: Follow up in ***   Signed, Aline FORBES Jadine DEVONNA  11/21/2023 5:58 PM     [facility]

## 2023-11-23 ENCOUNTER — Encounter (HOSPITAL_COMMUNITY): Payer: Self-pay | Admitting: Internal Medicine

## 2023-11-23 ENCOUNTER — Ambulatory Visit (HOSPITAL_COMMUNITY): Attending: Internal Medicine

## 2023-11-25 ENCOUNTER — Ambulatory Visit: Admitting: Student

## 2023-12-15 ENCOUNTER — Encounter: Attending: Physical Medicine & Rehabilitation | Admitting: Physical Medicine & Rehabilitation

## 2023-12-15 DIAGNOSIS — Z720 Tobacco use: Secondary | ICD-10-CM | POA: Insufficient documentation

## 2023-12-15 DIAGNOSIS — G14 Postpolio syndrome: Secondary | ICD-10-CM | POA: Insufficient documentation

## 2023-12-27 ENCOUNTER — Encounter: Payer: Self-pay | Admitting: *Deleted

## 2024-01-25 ENCOUNTER — Telehealth (HOSPITAL_COMMUNITY): Payer: Self-pay | Admitting: *Deleted

## 2024-01-25 ENCOUNTER — Encounter (HOSPITAL_COMMUNITY): Payer: Self-pay | Admitting: *Deleted

## 2024-01-25 NOTE — Telephone Encounter (Signed)
 Letter with instructions for upcoming stress test sent via [employer].  Argentina Bees, RN

## 2024-01-28 ENCOUNTER — Other Ambulatory Visit: Payer: Self-pay | Admitting: Internal Medicine

## 2024-02-01 ENCOUNTER — Encounter: Payer: Self-pay | Admitting: Internal Medicine

## 2024-02-02 ENCOUNTER — Other Ambulatory Visit: Payer: Self-pay | Admitting: Internal Medicine

## 2024-02-02 ENCOUNTER — Ambulatory Visit (HOSPITAL_COMMUNITY)
Admission: RE | Admit: 2024-02-02 | Discharge: 2024-02-02 | Disposition: A | Discharge status: Home / Self Care | Source: Ambulatory Visit | Attending: Internal Medicine | Admitting: Internal Medicine

## 2024-02-02 DIAGNOSIS — I2510 Atherosclerotic heart disease of native coronary artery without angina pectoris: Secondary | ICD-10-CM | POA: Insufficient documentation

## 2024-02-02 DIAGNOSIS — R079 Chest pain, unspecified: Secondary | ICD-10-CM | POA: Insufficient documentation

## 2024-02-02 LAB — MYOCARDIAL PERFUSION IMAGING
Base ST Depression (mm): 0 mm
LV dias vol: 79 mL (ref 62–150)
LV sys vol: 24 mL (ref 4.2–5.8)
Nuc Stress EF: 70 %
Peak HR: 113 {beats}/min
Rest HR: 87 {beats}/min
Rest Nuclear Isotope Dose: 10.4 mCi
SDS: 4
SRS: 5
SSS: 6
ST Depression (mm): 0 mm
Stress Nuclear Isotope Dose: 32.8 mCi
TID: 1.16

## 2024-02-02 MED ORDER — REGADENOSON 0.4 MG/5ML IV SOLN
INTRAVENOUS | Status: AC
Start: 2024-02-02 — End: 2024-02-02
  Filled 2024-02-02: qty 5

## 2024-02-02 MED ORDER — TECHNETIUM TC 99M TETROFOSMIN IV KIT
32.8000 | PACK | Freq: Once | INTRAVENOUS | Status: AC | PRN
  Administered 2024-02-02: 32.8 via INTRAVENOUS

## 2024-02-02 MED ORDER — REGADENOSON 0.4 MG/5ML IV SOLN
0.4000 mg | Freq: Once | INTRAVENOUS | Status: AC
  Administered 2024-02-02: 0.4 mg via INTRAVENOUS

## 2024-02-02 MED ORDER — TECHNETIUM TC 99M TETROFOSMIN IV KIT
10.4000 | PACK | Freq: Once | INTRAVENOUS | Status: AC | PRN
  Administered 2024-02-02: 10.4 via INTRAVENOUS

## 2024-02-03 ENCOUNTER — Ambulatory Visit: Payer: Self-pay | Admitting: Internal Medicine

## 2024-02-04 ENCOUNTER — Telehealth: Payer: Self-pay

## 2024-02-04 NOTE — Telephone Encounter (Signed)
 [facility] called requesting an updated script for John Mcintyre's KAFO. Also please include an updated note.  After 6 month the order has expired.     John Mcintyre has cancelled appointments since  April 2025. He did show up for the appointment on yesterday at [facility]. They were not able to help him because of the expired order. Please advise.

## 2024-02-08 NOTE — Progress Notes (Unsigned)
 Subjective:    Patient ID: John Mcintyre, male    DOB: 1971/03/22, 53 y.o.   MRN: [id number]  HPI  John Mcintyre is here in f/u of his PPS. He has seen [employer] who is crafting a left AFO. Since we last met he has developed pain in his right leg starting at his right knee with radiation down to his foot. He describes the pain as sharp and electric like. He may walk only a few feet before it starts. Carrying things worsens the pain. For pain he rests and has tried some massage, HE also likes to go to the pool, steam room.   For pain he uses ibuprofen  800 mg.  He only uses it 1 time a week.  Otherwise he rests at the pace himself.  He still is using his forearm crutches.  He puts his weight primarily on the right leg.  He asked me today whether he would have recurrent polio symptoms.  We discussed the course of polio and why weakness occurs later in life.   Pain Inventory Average Pain 0-10 varies depending on activity or standing, walking Pain Right Now 0 My pain is left hip, right leg  LOCATION OF PAIN  left hip, right leg  BOWEL Number of stools per week: N/A Oral laxative use N/A Type of laxative N/A Enema or suppository use N/A History of colostomy N/A Incontinent N/A  BLADDER Normal In and out cath, frequency N/A Able to self cath N/A Bladder incontinence N/A Frequent urination N/A Leakage with coughing N/A Difficulty starting stream N/A Incomplete bladder emptying N/A   Mobility walk with assistance how many minutes can you walk? varies ability to climb steps?  yes do you drive?  yes Do you have any goals in this area?  yes  Function Do you have any goals in this area?  no  Neuro/Psych No problems in this area  Prior Studies Any changes since last visit?  no  Physicians involved in your care Any changes since last visit?  no   Family History  Problem Relation Age of Onset   Hypertension Mother    Heart failure Father    Social History   Socioeconomic  History   Marital status: Married    Spouse name: Not on file   Number of children: 3   Years of education: 2 years of college   Highest education level: Not on file  Occupational History   Occupation: Media planner  Tobacco Use   Smoking status: Every Day    Current packs/day: 2.00    Types: Cigarettes   Smokeless tobacco: Never   Tobacco comments:    ultra lights    Hemp cigarettes 2 days ago  Vaping Use   Vaping status: Never Used  Substance and Sexual Activity   Alcohol use: Yes    Alcohol/week: 6.0 standard drinks of alcohol    Types: 6 Cans of beer per week    Comment: occasionally   Drug use: No   Sexual activity: Not on file  Other Topics Concern   Not on file  Social History Narrative   Lives at home with wife and three children.   Right-handed.   5 cups tea per day.   Social Drivers of Corporate investment banker Strain: Not on file  Food Insecurity: Low Risk  (04/21/2023)   Received from [facility]   Hunger Vital Sign    Within the past 12 months, you worried that your food would run out  before you got money to buy more: Never true    Within the past 12 months, the food you bought just didn't last and you didn't have money to get more. : Never true  Transportation Needs: No Transportation Needs (04/21/2023)   Received from [facility]    In the past 12 months, has lack of reliable transportation kept you from medical appointments, meetings, work or from getting things needed for daily living? : No  Physical Activity: Inactive (04/21/2023)   Received from [facility]   Exercise Vital Sign    On average, how many days per week do you engage in moderate to strenuous exercise (like a brisk walk)?: 0 days    On average, how many minutes do you engage in exercise at this level?: 0 min  Stress: Not on file  Social Connections: Not on file   Past Surgical History:  Procedure Laterality Date   CARDIAC CATHETERIZATION N/A 12/23/2015    Procedure: Left Heart Cath and Coronary Angiography;  Surgeon: Lonni JONETTA Cash, MD;  Location: [facility] INVASIVE CV LAB;  Service: Cardiovascular;  Laterality: N/A;   CARDIAC CATHETERIZATION N/A 12/23/2015   Procedure: Coronary Stent Intervention;  Surgeon: Lonni JONETTA Cash, MD;  Location: [facility] INVASIVE CV LAB;  Service: Cardiovascular;  Laterality: N/A;   CORONARY STENT PLACEMENT  12/23/2015   Successful PTCA/DES x 1 proximal LAD   HIP SURGERY Left 1987   for polio, not sure what was done   KNEE SURGERY Left 1987   for polio   TOOTH EXTRACTION N/A 11/25/2020   Procedure: DENTAL RESTORATION/EXTRACTIONS;  Surgeon: Sheryle Hamilton, DMD;  Location: [facility] OR;  Service: Oral Surgery;  Laterality: N/A;   Past Medical History:  Diagnosis Date   Coronary artery disease    Hypercholesteremia    Hypertension    Myocardial infarction (HCC)    Polio    BP (!) 136/90 (BP Location: Left Arm, Patient Position: Sitting, Cuff Size: Large)   Pulse 92   Ht 5' 5 (1.651 m)   Wt 186 lb (84.4 kg)   SpO2 97%   BMI 30.95 kg/m   Opioid Risk Score:   Fall Risk Score:  `1  Depression screen PHQ 2/9      No data to display          Review of Systems  Cardiovascular:        Hypertension  Musculoskeletal:  Positive for gait problem and myalgias.       Left leg polio osteopathy       Objective:   Physical Exam General: No acute distress HEENT: NCAT, EOMI, oral membranes moist Cards: reg rate  Chest: normal effort Abdomen: Soft, NT, ND Skin: dry, intact Extremities: no edema Psych: pleasant and appropriate  Skin: intact Neuro: Alert and oriented x 3. Normal insight and awareness. Intact Memory. Normal language and speech. Does seem to have short attention span. Cranial nerve exam unremarkable. MMT: BUE 4+ to 5/5. RLE: HF 2, KE 2, KF 2+, ADF/PF 4/5. LLE 0-tr throughout except for ADF/PF which is 1/5. DTR's 1+. Sensory exam normal for light touch and pain in all 4 limbs. No limb ataxia or  cerebellar signs. No abnormal tone appreciated.  .   Musculoskeletal: left leg atrophic and approximately 3 shorter than the right. Pt ambulates using loftstrand crutches and his right leg for support.  Varus moment in stance along the right leg.  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 Pt suffered from polio as a 68 month old child.  Tobacco use Hx of CAD   Plan: We again discussed the course of post-polio syndrome.  Signed rx for left KAFO today. Needs rx for right knee sleeve too for support  Discussed maintaining aerobic exercise up to the point of fatigue. Otherwise conserve energy during daily activities. He may need a motorized chair at some point.  Adequate  sleep, 8-10 hours per night Tobacco cessation was discussed.  Rx for ibuprofen  for pain  Will send to PT for gait training/exercise education once he receives brace..  Might benefit from shoe lift on the left side as well given his leg length discrepancy. 8.   Over 20 minutes of face to face patient care time were spent during this visit. All questions were encouraged and answered. Follow up with me in 3 months.

## 2024-02-09 ENCOUNTER — Encounter: Payer: Self-pay | Admitting: Physical Medicine & Rehabilitation

## 2024-02-09 ENCOUNTER — Encounter: Attending: Physical Medicine & Rehabilitation | Admitting: Physical Medicine & Rehabilitation

## 2024-02-09 VITALS — BP 136/90 | HR 92 | Ht 65.0 in | Wt 186.0 lb

## 2024-02-09 DIAGNOSIS — G14 Postpolio syndrome: Secondary | ICD-10-CM | POA: Diagnosis not present

## 2024-02-09 DIAGNOSIS — M25561 Pain in right knee: Secondary | ICD-10-CM | POA: Insufficient documentation

## 2024-02-09 DIAGNOSIS — G8929 Other chronic pain: Secondary | ICD-10-CM | POA: Insufficient documentation

## 2024-02-09 MED ORDER — DICLOFENAC SODIUM 50 MG PO TBEC: 50.0000 mg | DELAYED_RELEASE_TABLET | Freq: Two times a day (BID) | ORAL | 3 refills | Status: AC

## 2024-02-09 NOTE — Patient Instructions (Addendum)
 ALWAYS FEEL FREE TO CALL OUR OFFICE WITH ANY PROBLEMS OR QUESTIONS [phone])  **PLEASE NOTE** ALL MEDICATION REFILL REQUESTS (INCLUDING CONTROLLED SUBSTANCES) NEED TO BE MADE AT LEAST 7 DAYS PRIOR TO REFILL BEING DUE. ANY REFILL REQUESTS INSIDE THAT TIME FRAME MAY RESULT IN DELAYS IN RECEIVING YOUR PRESCRIPTION.     HAVE [facility] CONTACT ME ONCE YOU'RE IN YOUR BRACES. THEN WE'LL MAKE A REFERRAL TO PHY[facility]AL [facility]Y AT [address]

## 2024-02-11 ENCOUNTER — Other Ambulatory Visit: Payer: Self-pay | Admitting: Internal Medicine

## 2024-02-11 ENCOUNTER — Telehealth: Payer: Self-pay

## 2024-02-11 NOTE — Telephone Encounter (Signed)
(  Key: [id number]) PA Case ID #: [id number] Rx #: [id number] Need Help? Call us  at [phone] Status sent iconSent to Plan today Drug Diclofenac  Sodium 50MG  dr tablets ePA cloud logo Form [facility] Medicaid of   Electronic Prior Authorization Request Form [phone] NCPDP)

## 2024-02-14 ENCOUNTER — Ambulatory Visit: Payer: Self-pay | Admitting: Internal Medicine

## 2024-02-15 ENCOUNTER — Other Ambulatory Visit: Payer: Self-pay | Admitting: Student

## 2024-02-15 ENCOUNTER — Telehealth: Payer: Self-pay

## 2024-02-15 NOTE — Telephone Encounter (Signed)
 Approval rec'd from [facility] for Diclofenac  Sodium 50 mg tablets.   Approval Valid 01/28/24 - 02/10/25 PA: [id number] Approval sent to be scan into chart

## 2024-05-10 ENCOUNTER — Encounter: Attending: Physical Medicine & Rehabilitation | Admitting: Physical Medicine & Rehabilitation

## 2024-05-10 DIAGNOSIS — M25561 Pain in right knee: Secondary | ICD-10-CM | POA: Insufficient documentation

## 2024-05-10 DIAGNOSIS — G14 Postpolio syndrome: Secondary | ICD-10-CM | POA: Insufficient documentation

## 2024-05-10 DIAGNOSIS — G8929 Other chronic pain: Secondary | ICD-10-CM | POA: Insufficient documentation
